# Patient Record
Sex: Female | Born: 1945 | Hispanic: No | Marital: Married | Smoking: Never smoker
Health system: Southern US, Community
[De-identification: ages and names within clinical notes are randomized; demographics above are authoritative.]

## PROBLEM LIST (undated history)

## (undated) DIAGNOSIS — N200 Calculus of kidney: Secondary | ICD-10-CM

## (undated) DIAGNOSIS — R911 Solitary pulmonary nodule: Secondary | ICD-10-CM

## (undated) DIAGNOSIS — T7840XA Allergy, unspecified, initial encounter: Secondary | ICD-10-CM

## (undated) DIAGNOSIS — C3490 Malignant neoplasm of unspecified part of unspecified bronchus or lung: Secondary | ICD-10-CM

## (undated) DIAGNOSIS — M810 Age-related osteoporosis without current pathological fracture: Secondary | ICD-10-CM

## (undated) DIAGNOSIS — Z8619 Personal history of other infectious and parasitic diseases: Secondary | ICD-10-CM

## (undated) DIAGNOSIS — D649 Anemia, unspecified: Secondary | ICD-10-CM

## (undated) DIAGNOSIS — M1990 Unspecified osteoarthritis, unspecified site: Secondary | ICD-10-CM

## (undated) DIAGNOSIS — I8392 Asymptomatic varicose veins of left lower extremity: Secondary | ICD-10-CM

## (undated) DIAGNOSIS — K649 Unspecified hemorrhoids: Secondary | ICD-10-CM

## (undated) DIAGNOSIS — K219 Gastro-esophageal reflux disease without esophagitis: Secondary | ICD-10-CM

## (undated) DIAGNOSIS — J309 Allergic rhinitis, unspecified: Secondary | ICD-10-CM

## (undated) DIAGNOSIS — R011 Cardiac murmur, unspecified: Secondary | ICD-10-CM

## (undated) HISTORY — DX: Gastro-esophageal reflux disease without esophagitis: K21.9

## (undated) HISTORY — DX: Anemia, unspecified: D64.9

## (undated) HISTORY — DX: Unspecified osteoarthritis, unspecified site: M19.90

## (undated) HISTORY — DX: Allergy, unspecified, initial encounter: T78.40XA

## (undated) HISTORY — DX: Cardiac murmur, unspecified: R01.1

## (undated) HISTORY — DX: Allergic rhinitis, unspecified: J30.9

## (undated) HISTORY — DX: Asymptomatic varicose veins of left lower extremity: I83.92

## (undated) HISTORY — PX: BREAST EXCISIONAL BIOPSY: SUR124

## (undated) HISTORY — DX: Calculus of kidney: N20.0

## (undated) HISTORY — DX: Age-related osteoporosis without current pathological fracture: M81.0

## (undated) HISTORY — PX: WRIST FRACTURE SURGERY: SHX121

## (undated) HISTORY — DX: Unspecified hemorrhoids: K64.9

---
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
## 1973-05-22 HISTORY — PX: LIPOMA EXCISION: SHX5283

## 1995-05-23 HISTORY — PX: ABDOMINAL HYSTERECTOMY: SHX81

## 2009-05-22 DIAGNOSIS — K649 Unspecified hemorrhoids: Secondary | ICD-10-CM

## 2009-05-22 HISTORY — DX: Unspecified hemorrhoids: K64.9

## 2009-12-08 ENCOUNTER — Ambulatory Visit: Payer: Self-pay | Admitting: Family

## 2009-12-08 ENCOUNTER — Ambulatory Visit: Payer: Self-pay | Admitting: Diagnostic Radiology

## 2009-12-08 ENCOUNTER — Ambulatory Visit (HOSPITAL_BASED_OUTPATIENT_CLINIC_OR_DEPARTMENT_OTHER): Admission: RE | Admit: 2009-12-08 | Discharge: 2009-12-08 | Payer: Self-pay | Admitting: Internal Medicine

## 2009-12-08 DIAGNOSIS — R609 Edema, unspecified: Secondary | ICD-10-CM | POA: Insufficient documentation

## 2009-12-08 DIAGNOSIS — K921 Melena: Secondary | ICD-10-CM | POA: Insufficient documentation

## 2009-12-08 DIAGNOSIS — K219 Gastro-esophageal reflux disease without esophagitis: Secondary | ICD-10-CM | POA: Insufficient documentation

## 2009-12-09 ENCOUNTER — Ambulatory Visit: Payer: Self-pay | Admitting: Diagnostic Radiology

## 2009-12-09 ENCOUNTER — Ambulatory Visit (HOSPITAL_BASED_OUTPATIENT_CLINIC_OR_DEPARTMENT_OTHER): Admission: RE | Admit: 2009-12-09 | Discharge: 2009-12-09 | Payer: Self-pay | Admitting: Internal Medicine

## 2009-12-09 ENCOUNTER — Telehealth: Payer: Self-pay | Admitting: Gastroenterology

## 2009-12-09 ENCOUNTER — Ambulatory Visit: Payer: Self-pay | Admitting: Internal Medicine

## 2009-12-09 ENCOUNTER — Encounter: Payer: Self-pay | Admitting: Family

## 2009-12-09 LAB — CONVERTED CEMR LAB
AST: 21 units/L (ref 0–37)
Basophils Absolute: 0.1 10*3/uL (ref 0.0–0.1)
Basophils Relative: 2 % — ABNORMAL HIGH (ref 0–1)
CO2: 23 meq/L (ref 19–32)
Calcium: 9.7 mg/dL (ref 8.4–10.5)
Chloride: 105 meq/L (ref 96–112)
Cholesterol: 206 mg/dL — ABNORMAL HIGH (ref 0–200)
Clue Cells Wet Prep HPF POC: NONE SEEN
Eosinophils Absolute: 0.2 10*3/uL (ref 0.0–0.7)
Eosinophils Relative: 4 % (ref 0–5)
Hemoglobin: 13.8 g/dL (ref 12.0–15.0)
Lymphocytes Relative: 44 % (ref 12–46)
Lymphs Abs: 2.6 10*3/uL (ref 0.7–4.0)
MCHC: 33.9 g/dL (ref 30.0–36.0)
Monocytes Absolute: 0.7 10*3/uL (ref 0.1–1.0)
Neutro Abs: 2.3 10*3/uL (ref 1.7–7.7)
RDW: 13.1 % (ref 11.5–15.5)
Sodium: 142 meq/L (ref 135–145)
TSH: 3.047 microintl units/mL (ref 0.350–4.500)
Total CHOL/HDL Ratio: 3.9
Trich, Wet Prep: NONE SEEN
Triglycerides: 97 mg/dL (ref <150)
VLDL: 19 mg/dL (ref 0–40)
Vit D, 1,25-Dihydroxy: 28 — ABNORMAL LOW (ref 30–89)
WBC: 5.9 10*3/uL (ref 4.0–10.5)

## 2009-12-09 LAB — HM MAMMOGRAPHY: HM Mammogram: NORMAL

## 2009-12-10 ENCOUNTER — Ambulatory Visit: Payer: Self-pay | Admitting: Gastroenterology

## 2009-12-10 ENCOUNTER — Encounter (INDEPENDENT_AMBULATORY_CARE_PROVIDER_SITE_OTHER): Payer: Self-pay | Admitting: *Deleted

## 2009-12-10 DIAGNOSIS — K625 Hemorrhage of anus and rectum: Secondary | ICD-10-CM | POA: Insufficient documentation

## 2009-12-13 ENCOUNTER — Encounter: Payer: Self-pay | Admitting: Family

## 2009-12-15 ENCOUNTER — Ambulatory Visit: Payer: Self-pay | Admitting: Gastroenterology

## 2009-12-24 ENCOUNTER — Telehealth: Payer: Self-pay | Admitting: Family

## 2009-12-24 DIAGNOSIS — M899 Disorder of bone, unspecified: Secondary | ICD-10-CM | POA: Insufficient documentation

## 2009-12-24 DIAGNOSIS — M949 Disorder of cartilage, unspecified: Secondary | ICD-10-CM

## 2009-12-28 ENCOUNTER — Encounter: Payer: Self-pay | Admitting: Family

## 2009-12-28 LAB — CONVERTED CEMR LAB
Calcium, Total (PTH): 10.1 mg/dL (ref 8.4–10.5)
Vit D, 1,25-Dihydroxy: 26 — ABNORMAL LOW (ref 30–89)

## 2009-12-31 ENCOUNTER — Telehealth: Payer: Self-pay | Admitting: Family

## 2010-01-05 ENCOUNTER — Encounter: Payer: Self-pay | Admitting: Gastroenterology

## 2010-01-19 ENCOUNTER — Telehealth: Payer: Self-pay | Admitting: Family

## 2010-01-20 HISTORY — PX: HEMORRHOID SURGERY: SHX153

## 2010-02-03 ENCOUNTER — Ambulatory Visit (HOSPITAL_COMMUNITY): Admission: RE | Admit: 2010-02-03 | Discharge: 2010-02-03 | Payer: Self-pay | Admitting: Surgery

## 2010-03-09 ENCOUNTER — Encounter (INDEPENDENT_AMBULATORY_CARE_PROVIDER_SITE_OTHER): Payer: Self-pay | Admitting: *Deleted

## 2010-06-21 NOTE — Consult Note (Signed)
Summary: [facility]   Imported By: Lennie Odor 01/28/2010 17:11:51  _____________________________________________________________________  External Attachment:    Type:   Image     Comment:   External Document

## 2010-06-21 NOTE — Assessment & Plan Note (Signed)
Summary: new to be est wants cpx/mhf--rm 5   Vital Signs:  Patient profile:   65 year old female Height:      69.5 inches Weight:      205 pounds BMI:     29.95 Temp:     98.4 degrees F oral Pulse rate:   90 / minute Pulse rhythm:   regular Resp:     18 per minute BP sitting:   118 / 80  (right arm) Cuff size:   large  Vitals Entered By: Mervin Kung CMA Duncan Dull) (December 08, 2009 1:56 PM) Is Patient Diabetic? No   History of Present Illness: Stephanie Crawford is a 65 year old female who presents to establish care. Has not had a PCP since moving here in 2005  1)BRBPR-  with some BM's over last 2 weeks, more so the last 2 weeks.  Last colonoscopy 2005 normal per patient.  + history of hemorrhoids.  These studies were done in [place].  2) Weight gain- 15 lb weight gain since March.  Has not changed eating habits or her activity.    3) Notes some vaginal itching/inner leg, using hydrocortisone.    4) Indigestion-  +nausea, unsettled.  Denies diarrhea or vomitting  5) R ankle swelling- remote injury.  Preventive Screening-Counseling & Management  Alcohol-Tobacco     Alcohol drinks/day: <1     Alcohol type: wine     Smoking Status: never  Caffeine-Diet-Exercise     Caffeine use/day: 3 drinks daily     Does Patient Exercise: yes     Type of exercise: walking     Exercise (avg: min/session): 30-60     Times/week: <3  Allergies (verified): No Known Drug Allergies  Past History:  Past Medical History: HIV testing--2009 Allergies Heart Murmur hemorrhoids anemia prior to hysterectomy  Past Surgical History: Appendectomy--1960 Hysterectomy--1997 (uterus only) Fatty tumor removed back of neck--1975 Fatty tumor removed left breast--1975 Fatty tumor removed under right jaw--1975  Family History: Father--prostate cancer, deceased Maternal GM--lung caner, deceased Mom- living alive and well 5 children  1974-Son-alive and well 1976- Daughter-alive and well 52- Son-  knee problems (basketball injury) 1982-Daughter- asthma 73- daughter- in military Pt has 7 siblings, - + vitamin D deficiency, asthma, allergies.  Social History: Occupation: Tax Museum/gallery conservator Married Never Smoked Alcohol use-yes,  1-2 glasses rarely Regular exercise-yes- walking Smoking Status:  never Does Patient Exercise:  yes Caffeine use/day:  3 drinks daily  Review of Systems       Constitutional: Denies Fever ENT:  Denies nasal congestion or sore throat. Resp: occasional dust related cough CV:  Denies Chest Pain or shortness of breath GI:  Denies nausea or vomitting or diarrhea GU: Denies dysuria Lymphatic: Denies lymphadenopathy Musculoskeletal:  Occasional pain in legs due to varicose veins Skin:  Denies Rashes or  concerning lesions Psychiatric: Denies depression Neuro: Denies numbness or weakness.     Physical Exam  General:  Well-developed,well-nourished,in no acute distress; alert,appropriate and cooperative throughout examination Head:  Normocephalic and atraumatic without obvious abnormalities. No apparent alopecia or balding. Eyes:  PERRLA Ears:  External ear exam shows no significant lesions or deformities.  Otoscopic examination reveals clear canals, tympanic membranes are intact bilaterally without bulging, retraction, inflammation or discharge. Hearing is grossly normal bilaterally. Mouth:  Oral mucosa and oropharynx without lesions or exudates.  Teeth in good repair. Neck:  No deformities, masses, or tenderness noted. Breasts:  No mass, nodules, thickening, tenderness, bulging, retraction, inflamation, nipple discharge or skin changes noted.  Lungs:  Normal respiratory effort, chest expands symmetrically. Lungs are clear to auscultation, no crackles or wheezes. Heart:  Normal rate and regular rhythm. S1 and S2 normal without gallop, murmur, click, rub or other extra sounds. Abdomen:  Bowel sounds positive,abdomen soft and non-tender without masses,  organomegaly or hernias noted. Rectal:  + friable approximately 1 cm rectal protrusion with irregular borders,  two small nodular abnormalities on posterior rectal wall.  Genitalia:  Pelvic Exam:        External: normal female genitalia without lesions or masses        Vagina: normal without lesions or masses        Cervix: surgically absent        Adnexa: normal bimanual exam without masses or fullness        Uterus: surgically absent        Pap smear: not performed Msk:  some swelling noted of the right lateral ankle. Pulses:  R and L carotid,radial,femoral,dorsalis pedis and posterior tibial pulses are full and equal bilaterally Extremities:  + spider veins bilateral LE Neurologic:  No cranial nerve deficits noted. Station and gait are normal. Plantar reflexes are down-going bilaterally. DTRs are symmetrical throughout. Sensory, motor and coordinative functions appear intact. Skin:  + redness bilateral groin Cervical Nodes:  No lymphadenopathy noted Axillary Nodes:  No palpable lymphadenopathy Psych:  Cognition and judgment appear intact. Alert and cooperative with normal attention span and concentration. No apparent delusions, illusions, hallucinations   Impression & Recommendations:  Problem # 1:  Preventive Health Care (ICD-V70.0) Assessment Comment Only Patient was counseled on healthy diet, exercise and weight loss.  Orders: Mammogram (Screening) (Mammo) TLB-BMP (Basic Metabolic Panel-BMET) (80048-METABOL) TLB-CBC Platelet - w/Differential (85025-CBCD) TLB-Hepatic/Liver Function Pnl (80076-HEPATIC) TLB-TSH (Thyroid Stimulating Hormone) (84443-TSH) TLB-Lipid Panel (80061-LIPID) T-Assay of Vitamin D (95621-30865) T-Wet Prep by Molecular Probe (78469-62952) EKG w/ Interpretation (93000)  Problem # 2:  HEMATOCHEZIA (ICD-578.1) Assessment: New  Will plan referral to GI.  Also needs evaluation of rectal abnormalities.  ? polyps, ? irregular hemorrhoid versus rectal mass.     Orders: Gastroenterology Referral (GI)  Problem # 3:  ANKLE EDEMA (ICD-782.3) Will order x-ray to further evaluate.  Suspect OA. Orders: T-Ankle Comp Right (73610TC)  Problem # 4:  GERD (ICD-530.81) Will plan to check LFT's given pt's GI complaints.  However I suspect that her symptoms are GERD related.  Will give trial of GERD Her updated medication list for this problem includes:    Prilosec Otc 20 Mg Tbec (Omeprazole magnesium) ..... One tablet by mouth daily  Complete Medication List: 1)  Prilosec Otc 20 Mg Tbec (Omeprazole magnesium) .... One tablet by mouth daily 2)  Caltrate 600+d 600-400 Mg-unit Tabs (Calcium carbonate-vitamin d) .... One tablet by mouth two times a day 3)  Nystatin 100000 Unit/gm Crea (Nystatin) .... Apply two times a day to affected area  Patient Instructions: 1)  Please return fasting for blood work downstairs. 2)  Please schedule a bone density test at check out. 3)  You will be contacted aobut your referral to GI.   4)  Please complete your knee x-ray and mammogram downstairs. 5)  Follow up in 3 months. 6)  It was a pleasure to meet you. Prescriptions: NYSTATIN 100000 UNIT/GM CREA (NYSTATIN) apply two times a day to affected area  #1 x 0   Entered and Authorized by:   Stephanie Fillers FNP   Signed by:   Stephanie Fillers FNP on 12/08/2009   Method used:  Electronically to        [facility] # [phone]* (retail)       [address]       [place], [place]  [address]       Ph: [phone]       Fax: [phone]   RxID:   [id number]    Vital Signs:  Patient Profile:   65 year old female Height:     69.5 inches Weight:      205 pounds BMI:     29.95 Temp:     98.4 degrees F oral Pulse rate:   90 / minute Pulse rhythm:   regular Resp:     18 per minute BP sitting:   118 / 80 Cuff size:   large                   Preventive Care Screening     Pt had pap smear and mammogram in 2008 (not sure  which month)--normal.  Last Bone Density in 2009--osteopenia   Current Allergies (reviewed today): No known allergies

## 2010-06-21 NOTE — Progress Notes (Signed)
Summary: bone density result  Phone Note Outgoing Call   Summary of Call: Please call patient and let her know that her bone density test is showing some bone thinning- osteopenia.  I would like to check some additional lab tests.  Please arrange the following labs- Vitamin D, intact PTH (733.9).   Initial call taken by: Lemont Fillers FNP,  December 24, 2009 4:41 PM  Follow-up for Phone Call        Left message on machine to return my call.  Nicki Guadalajara Fergerson CMA Duncan Dull)  December 27, 2009 8:24 AM   Pt returned my call and was notified per [facility] instructions.  Pt will return tomorrow for labs, fasting. Order sent to lab.  Nicki Guadalajara Fergerson CMA Duncan Dull)  December 27, 2009 9:38 AM   New Problems: OSTEOPENIA (ICD-733.90)   New Problems: OSTEOPENIA (ICD-733.90)

## 2010-06-21 NOTE — Procedures (Signed)
Summary: Colonoscopy  Patient: [name] Note: All result statuses are Final unless otherwise noted.  Tests: (1) Colonoscopy (COL)   COL Colonoscopy           DONE     [facility]     [address]     [place], [place]  [address]           COLONOSCOPY PROCEDURE REPORT           PATIENT:  Stephanie Crawford, Stephanie Crawford  MR#:  [id number]     BIRTHDATE:  07-26-1945, 64 yrs. old  GENDER:  female     ENDOSCOPIST:  Vania Rea. Jarold Motto, MD, [facility]     REF. BY:     PROCEDURE DATE:  12/15/2009     PROCEDURE:  Diagnostic Colonoscopy     ASA CLASS:  Class II     INDICATIONS:  hematochezia     MEDICATIONS:   Fentanyl 75 mcg IV, Versed 7 mg IV           DESCRIPTION OF PROCEDURE:   After the risks benefits and     alternatives of the procedure were thoroughly explained, informed     consent was obtained.  Digital rectal exam was performed and     revealed tender and large external hemorrhoids.  see pictures The     LB [id number] endoscope was introduced through the anus and     advanced to the cecum, which was identified by both the appendix     and ileocecal valve, limited by a redundant colon.    The quality     of the prep was excellent, using MoviPrep.  The instrument was     then slowly withdrawn as the colon was fully examined.     <<PROCEDUREIMAGES>>           FINDINGS:  Moderate diverticulosis was found throughout the colon.     pancolonic diverticulosis.  No polyps or cancers were seen.     External hemorrhoids were found.   Retroflexed views in the rectum     revealed external hemorrhoids.    The scope was then withdrawn     from the patient and the procedure completed.           COMPLICATIONS:  None     ENDOSCOPIC IMPRESSION:     1) Moderate diverticulosis throughout the colon     2) No polyps or cancers     3) External hemorrhoids     RECOMMENDATIONS:     surgical referral for hemorrhoidectomy.     REPEAT EXAM:  No           ______________________________   Vania Rea. Jarold Motto, MD, [facility]           CC:  Sandford Craze FNPWilson, Eric MD           n.     Rosalie DoctorVania Rea. Patterson at 12/15/2009 09:03 AM           Loretta Plume, [phone]  Note: An exclamation mark (!) indicates a result that was not dispersed into the flowsheet. Document Creation Date: 12/15/2009 9:05 AM _______________________________________________________________________  (1) Order result status: Final Collection or observation date-time: 12/15/2009 08:56 Requested date-time:  Receipt date-time:  Reported date-time:  Referring Physician:   Ordering Physician: Sheryn Bison [phone]) Specimen Source:  Source: Launa Grill Order Number: [id number] Lab site:   Appended Document: Colonoscopy     Procedures Next Due Date:    Colonoscopy: 11/2019

## 2010-06-21 NOTE — Progress Notes (Signed)
  Phone Note Outgoing Call   Summary of Call: Please call patient and let her know that her vitamin D is low and her bone scan is showing bone thinning.   I would like her to add the following medications as noted below.  She should follow up in 3 months. Initial call taken by: Lemont Fillers FNP,  January 03, 2010 1:52 PM  Follow-up for Phone Call        informed pt of results and medications to be taken Follow-up by: Brenton Grills MA,  January 03, 2010 2:05 PM    New/Updated Medications: CALTRATE 600+D 600-400 MG-UNIT TABS (CALCIUM CARBONATE-VITAMIN D) one tablet by mouth two times a day VITAMIN D 1000 UNIT TABS (CHOLECALCIFEROL) 2 tablet by mouth daily ALENDRONATE SODIUM 70 MG TABS (ALENDRONATE SODIUM) one tablet by mouth once weekly on empty stomach.  Avoid laying down for 30 minutes after taking. Prescriptions: ALENDRONATE SODIUM 70 MG TABS (ALENDRONATE SODIUM) one tablet by mouth once weekly on empty stomach.  Avoid laying down for 30 minutes after taking.  #4 x 3   Entered and Authorized by:   Lemont Fillers FNP   Signed by:   Lemont Fillers FNP on 01/03/2010   Method used:   Electronically to        [facility] # [phone]* (retail)       [address]       [place], [place]  [address]       Ph: [phone]       Fax: [phone]   RxID:   [id number]

## 2010-06-21 NOTE — Miscellaneous (Signed)
Summary: BONE DENSITY  Clinical Lists Changes  Orders: Added new Test order of T-Bone Densitometry (77080) - Signed Added new Test order of T-Lumbar Vertebral Assessment (77082) - Signed 

## 2010-06-21 NOTE — Letter (Signed)
Summary: [place] No Show Letter  [place] [facility]  [address]   [place], [place] [address]   Phone: [phone]  Fax: [phone]    03/09/2010 MRN: [id number]  Stephanie Crawford [address] [place], [place]  [address]   Dear [name],   Our records indicate that you missed your scheduled appointment with Sandford Craze on 03-09-2010.  Please contact this office to reschedule your appointment as soon as possible.  It is important that you keep your scheduled appointments with your physician, so we can provide you the best care possible.  Please be advised that there may be a charge for "no show" appointments.    Sincerely,   [place] [facility]

## 2010-06-21 NOTE — Letter (Signed)
Summary: [facility] Instructions  [facility]  [address] [place], [place] [address]   Phone: [phone]  Fax: [phone]       Stephanie Crawford    [birth year]    MRN: [id number]        Procedure Day Dorna Bloom: Wednesday, 12/15/09     Arrival Time: 7:30      Procedure Time: 8:30     Location of Procedure:                    _X _  [facility] (4th Floor)                         PREPARATION FOR COLONOSCOPY WITH MOVIPREP   Starting 5 days prior to your procedure 12/10/09 do not eat nuts, seeds, popcorn, corn, beans, peas,  salads, or any raw vegetables.  Do not take any fiber supplements (e.g. Metamucil, Citrucel, and Benefiber).  THE DAY BEFORE YOUR PROCEDURE         DATE: 12/14/09     DAY: Tuesday  1.  Drink clear liquids the entire day-NO SOLID FOOD  2.  Do not drink anything colored red or purple.  Avoid juices with pulp.  No orange juice.  3.  Drink at least 64 oz. (8 glasses) of fluid/clear liquids during the day to prevent dehydration and help the prep work efficiently.  CLEAR LIQUIDS INCLUDE: Water Jello Ice Popsicles Tea (sugar ok, no milk/cream) Powdered fruit flavored drinks Coffee (sugar ok, no milk/cream) Gatorade Juice: apple, white grape, white cranberry  Lemonade Clear bullion, consomm, broth Carbonated beverages (any kind) Strained chicken noodle soup Hard Candy                             4.  In the morning, mix first dose of MoviPrep solution:    Empty 1 Pouch A and 1 Pouch B into the disposable container    Add lukewarm drinking water to the top line of the container. Mix to dissolve    Refrigerate (mixed solution should be used within 24 hrs)  5.  Begin drinking the prep at 5:00 p.m. The MoviPrep container is divided by 4 marks.   Every 15 minutes drink the solution down to the next mark (approximately 8 oz) until the full liter is complete.   6.  Follow completed prep with 16 oz of clear liquid of your choice  (Nothing red or purple).  Continue to drink clear liquids until bedtime.  7.  Before going to bed, mix second dose of MoviPrep solution:    Empty 1 Pouch A and 1 Pouch B into the disposable container    Add lukewarm drinking water to the top line of the container. Mix to dissolve    Refrigerate  THE DAY OF YOUR PROCEDURE      DATE: 12/15/09    DAY: Wednesday  Beginning at  3:30 a.m. (5 hours before procedure):         1. Every 15 minutes, drink the solution down to the next mark (approx 8 oz) until the full liter is complete.  2. Follow completed prep with 16 oz. of clear liquid of your choice.    3. You may drink clear liquids until 6:30  (2 HOURS BEFORE PROCEDURE).   MEDICATION INSTRUCTIONS  Unless otherwise instructed, you should take regular prescription medications with a small sip of water  as early as possible the morning of your procedure.                  OTHER INSTRUCTIONS  You will need a responsible adult at least 64 years of age to accompany you and drive you home.   This person must remain in the waiting room during your procedure.  Wear loose fitting clothing that is easily removed.  Leave jewelry and other valuables at home.  However, you may wish to bring a book to read or  an iPod/MP3 player to listen to music as you wait for your procedure to start.  Remove all body piercing jewelry and leave at home.  Total time from sign-in until discharge is approximately 2-3 hours.  You should go home directly after your procedure and rest.  You can resume normal activities the  day after your procedure.  The day of your procedure you should not:   Drive   Make legal decisions   Operate machinery   Drink alcohol   Return to work  You will receive specific instructions about eating, activities and medications before you leave.    The above instructions have been reviewed and explained to me by   _______________________    I fully  understand and can verbalize these instructions _____________________________ Date _________

## 2010-06-21 NOTE — Progress Notes (Signed)
Summary: Faxed request from [facility]  Phone Note From Other Clinic   Reason for Call: Need Referral Information Summary of Call: Sent 12/08/09 OV notes & EKG report to [facility] Antonietta Breach RN [phone] per faxed request Initial call taken by: Lannette Donath,  January 19, 2010 3:11 PM

## 2010-06-21 NOTE — Assessment & Plan Note (Signed)
Summary: RECTAL BLEEDING/PL   History of Present Illness Visit Type: consult Primary GI MD: Sheryn Bison MD FACP FAGA Requesting Provider: Sandford Craze, NP Chief Complaint: BRB per rectum for 2 weeks, pt can see blood in toilet History of Present Illness:   65 year old female with recurrent rectal bleeding on and off periodically for at least 5-6 years. She apparently has had previous colonoscopies she been unremarkable, apparently last performed 5 years ago. We do not have these records for review.  She currently describes" hemorrhoids" in her rectum with bright red blood and difficulty wiping her anal area. She denies anorectal pain but does have frequent bleeding. She has no abdominal pain, denies constipation, and also denies upper gastrointestinal or hepatobiliary complaints and uses p.r.n. Prilosec for GERD. She is status post hysterectomy for fibroid tumors, and apparently in the past and been treated for iron deficiency anemia. Her mother had colon polyps at age 24. She does not smoke, abuse alcohol or NSAIDs.   GI Review of Systems    Reports weight gain.      Denies abdominal pain, acid reflux, belching, bloating, chest pain, dysphagia with liquids, dysphagia with solids, heartburn, loss of appetite, nausea, vomiting, vomiting blood, and  weight loss.      Reports hemorrhoids and  rectal bleeding.     Denies anal fissure, black tarry stools, change in bowel habit, constipation, diarrhea, diverticulosis, fecal incontinence, heme positive stool, irritable bowel syndrome, jaundice, light color stool, liver problems, and  rectal pain. Preventive Screening-Counseling & Management      Drug Use:  no.      Current Medications (verified): 1)  Prilosec Otc 20 Mg Tbec (Omeprazole Magnesium) .... One Tablet By Mouth Daily 2)  Caltrate 600+d 600-400 Mg-Unit Tabs (Calcium Carbonate-Vitamin D) .... One Tablet By Mouth Two Times A Day 3)  Nystatin 100000 Unit/gm Crea (Nystatin) ....  Apply Two Times A Day To Affected Area  Allergies (verified): No Known Drug Allergies  Past History:  Past medical, surgical, family and social histories (including risk factors) reviewed for relevance to current acute and chronic problems.  Past Medical History: HIV testing--2009 Allergies Heart Murmur hemorrhoids anemia prior to hysterectomy GERD  Past Surgical History: Reviewed history from 12/08/2009 and no changes required. Appendectomy--1960 Hysterectomy--1997 (uterus only) Fatty tumor removed back of neck--1975 Fatty tumor removed left breast--1975 Fatty tumor removed under right jaw--1975  Family History: Reviewed history from 12/08/2009 and no changes required. Father--prostate cancer, deceased Maternal GM--lung caner, deceased Mom- living alive and well 5 children  1974-Son-alive and well 65- Daughter-alive and well 39- Son- knee problems (basketball injury) 1982-Daughter- asthma 56- daughter- in military Pt has 7 siblings, - + vitamin D deficiency, asthma, allergies. No FH of Colon Cancer:  Social History: Reviewed history from 12/08/2009 and no changes required. Occupation: Tax Museum/gallery conservator, retired Married, 2 boys, 3 girls Never Smoked Alcohol use-yes,  1-2 glasses rarely Regular exercise-yes- walking Daily Caffeine Use 3 cups/day Illicit Drug Use - no Drug Use:  no  Review of Systems       The patient complains of muscle pains/cramps.  The patient denies allergy/sinus, anemia, anxiety-new, arthritis/joint pain, back pain, blood in urine, breast changes/lumps, confusion, cough, coughing up blood, depression-new, fainting, fatigue, fever, headaches-new, hearing problems, heart murmur, heart rhythm changes, itching, menstrual pain, night sweats, nosebleeds, pregnancy symptoms, shortness of breath, skin rash, sleeping problems, sore throat, swelling of feet/legs, swollen lymph glands, thirst - excessive, urination - excessive, urination changes/pain,  urine leakage, vision changes, and voice  change.   General:  Denies fever, chills, sweats, anorexia, fatigue, weakness, malaise, weight loss, and sleep disorder. GI:  Denies difficulty swallowing, pain on swallowing, nausea, indigestion/heartburn, vomiting, vomiting blood, abdominal pain, jaundice, gas/bloating, diarrhea, constipation, change in bowel habits, bloody BM's, black BMs, and fecal incontinence.  Vital Signs:  Patient profile:   65 year old female Height:      69.5 inches Weight:      205 pounds BMI:     29.95 Pulse rate:   76 / minute Pulse rhythm:   regular BP sitting:   118 / 80  (left arm) Cuff size:   regular  Vitals Entered By: Francee Piccolo CMA Duncan Dull) (December 10, 2009 11:02 AM)  Physical Exam  General:  Well developed, well nourished, no acute distress.healthy appearing.   Head:  Normocephalic and atraumatic. Eyes:  PERRLA, no icterus.exam deferred to patient's ophthalmologist.   Neck:  Supple; no masses or thyromegaly.Slightly swollen and tender right submandibular gland noted. Her thyroid also is palpable but nontender. Lungs:  Clear throughout to auscultation. Heart:  Regular rate and rhythm; no murmurs, rubs,  or bruits. Abdomen:  Soft, nontender and nondistended. No masses, hepatosplenomegaly or hernias noted. Normal bowel sounds. Rectal:  Anterior chronic seizure noted with prominent skin tag and anorectal verge with marked friability and some nodularity. This mass is nontender to touch but does bleed easily. Rectal exam otherwise was unremarkable. Extremities:  No clubbing, cyanosis, edema or deformities noted. Neurologic:  Alert and  oriented x4;  grossly normal neurologically. Cervical Nodes:  No significant cervical adenopathy. Psych:  Alert and cooperative. Normal mood and affect.   Impression & Recommendations:  Problem # 1:  RECTAL BLEEDING (ICD-569.3) Assessment Unchanged Anal polyp versus prominent skin tag from chronic fissuring. I am  concerned about this lesion size, appearance, and continue daily rectal bleeding. This lesion will need to be removed surgically, and surgical appointment has been made and also she has been scheduled for followup colonoscopy exam.Balneol solution with cotton ball wipes t.i.d. as tolerated along with high-fiber diet as tolerated.  Problem # 2:  GERD (ICD-530.81) Assessment: Improved She is on Prilosec per her primary care physician. She denies significant GERD symptomatology. Recent labs have been unremarkable including CBC.  Patient Instructions: 1)  Use Balneol solution to cleanse rectal area. 2)  You are scheduled for a colonoscopy. 3)  You will be referred to a surgoen. 4)  The medication list was reviewed and reconciled.  All changed / newly prescribed medications were explained.  A complete medication list was provided to the patient / caregiver. 5)  Copy sent to : Melissa O. Lendell Caprice nurse practitioner 6)  Please continue current medications.  7)  Constipation and Hemorrhoids brochure given.  8)  Colonoscopy and Flexible Sigmoidoscopy brochure given.  9)  Conscious Sedation brochure given.  10)  Local anal care as instructed 11)  Diet should be high in fiber ( fruits, vegetables, whole grains) but low in residue. Drink at least eight (8) glasses of water a day.   Appended Document: RECTAL BLEEDING/PL    Clinical Lists Changes  Medications: Added new medication of MOVIPREP 100 GM  SOLR (PEG-KCL-NACL-NASULF-NA ASC-C) As per prep instructions. - Signed Rx of MOVIPREP 100 GM  SOLR (PEG-KCL-NACL-NASULF-NA ASC-C) As per prep instructions.;  #1 x 0;  Signed;  Entered by: Ashok Cordia RN;  Authorized by: Mardella Layman MD [facility];  Method used: Electronically to [facility] # [id number]*, [address], [employer]  [address] [place], [place]  [address], Ph: [phone], Fax: [phone] Orders: Added new Test order of Colonoscopy (Colon) - Signed    Prescriptions: MOVIPREP 100 GM  SOLR  (PEG-KCL-NACL-NASULF-NA ASC-C) As per prep instructions.  #1 x 0   Entered by:   Ashok Cordia RN   Authorized by:   Mardella Layman MD [facility]   Signed by:   Ashok Cordia RN on 12/10/2009   Method used:   Electronically to        [facility] [address] # [phone]* (retail)       [address]       [place], [place]  [address]       Ph: [phone]       Fax: [phone]   RxID:   [id number]    Appended Document: RECTAL BLEEDING/PL    Clinical Lists Changes       Appended Document: RECTAL BLEEDING/PL Elane Fritz from [facility] calling.  Appt with Dr. Luisa Hart 12/30/09 arrive at 9:00.  Elane Fritz will call pt.   Clinical Lists Changes  Orders: Added new Test order of [facility] ([facility]) - Signed

## 2010-06-21 NOTE — Progress Notes (Signed)
Summary: Appt sooner than 9-7  Phone Note From Other Clinic   Caller: [facility] @ Sandford Craze, FNP Call For: Dr Christella Hartigan Reason for Call: Schedule Patient Appt Summary of Call: Would like patient seen sooner than 01-26-10 for rectal bleed. Initial call taken by: Leanor Kail [facility],  December 09, 2009 2:29 PM  Follow-up for Phone Call        pt sch as a new pt with Dr Leane Para aware will notify pt Follow-up by: Chales Abrahams CMA Duncan Dull),  December 09, 2009 3:13 PM

## 2010-06-21 NOTE — Letter (Signed)
   [place] [facility] [address] [place], [place]  [address]  [place] Phone: [phone]      December 13, 2009   Stephanie Crawford [address] [place], [place] [address]  RE:  LAB RESULTS  Dear  [name],  The following is an interpretation of your most recent lab tests.  Please take note of any instructions provided or changes to medications that have resulted from your lab work.  ELECTROLYTES:  Good - no changes needed  KIDNEY FUNCTION TESTS:  Good - no changes needed  LIVER FUNCTION TESTS:  Stable - no changes needed  LIPID PANEL:  Stable - no changes needed Triglyceride: 97   Cholesterol: 206   LDL: 134   HDL: 53   Chol/HDL%:  3.9 Ratio  THYROID STUDIES:  Thyroid studies normal TSH: 3.047     DIABETIC STUDIES:  Good - no changes needed Blood Glucose: 91    CBC:  Good - no changes needed The vaginal swab that we completed does not show strong sign of infection.  Please call if your symptoms have not improved.   Sincerely Yours,    Lemont Fillers FNP  Appended Document:  Mailed.

## 2010-08-04 LAB — SURGICAL PCR SCREEN
MRSA, PCR: NEGATIVE
Staphylococcus aureus: NEGATIVE

## 2010-08-04 LAB — DIFFERENTIAL
Basophils Relative: 1 % (ref 0–1)
Eosinophils Relative: 3 % (ref 0–5)
Lymphocytes Relative: 40 % (ref 12–46)
Monocytes Absolute: 0.5 10*3/uL (ref 0.1–1.0)
Monocytes Relative: 9 % (ref 3–12)
Neutrophils Relative %: 46 % (ref 43–77)

## 2010-08-04 LAB — COMPREHENSIVE METABOLIC PANEL
AST: 43 U/L — ABNORMAL HIGH (ref 0–37)
BUN: 8 mg/dL (ref 6–23)
Calcium: 10 mg/dL (ref 8.4–10.5)
Chloride: 104 mEq/L (ref 96–112)
Sodium: 142 mEq/L (ref 135–145)
Total Bilirubin: 1.6 mg/dL — ABNORMAL HIGH (ref 0.3–1.2)

## 2010-08-04 LAB — CBC
HCT: 40.6 % (ref 36.0–46.0)
Hemoglobin: 14.2 g/dL (ref 12.0–15.0)
RBC: 4.48 MIL/uL (ref 3.87–5.11)
WBC: 5.8 10*3/uL (ref 4.0–10.5)

## 2010-12-06 ENCOUNTER — Ambulatory Visit (INDEPENDENT_AMBULATORY_CARE_PROVIDER_SITE_OTHER): Payer: BC Managed Care – PPO | Admitting: Surgery

## 2010-12-06 ENCOUNTER — Encounter (INDEPENDENT_AMBULATORY_CARE_PROVIDER_SITE_OTHER): Payer: Self-pay | Admitting: Surgery

## 2010-12-06 DIAGNOSIS — K625 Hemorrhage of anus and rectum: Secondary | ICD-10-CM

## 2010-12-06 NOTE — Patient Instructions (Signed)
You will be scheduled for surgery.

## 2010-12-06 NOTE — Progress Notes (Signed)
Stephanie Crawford is a 65 y.o. female.    Chief Complaint  Patient presents with  . Other    3 month recheck hems    HPI HPI The patient returns to clinic today. She is almost one year out from hemorrhoidectomy. She had a 3 column hemorrhoidectomy back in September 2011. I last saw her in March of 2012 she had a small area of chronic granulation tissue noted. She also had a small anal fissure. She did have bright red blood per rectum. The amount is small. She denies any significant pain in the canal   No past medical history on file.  No past surgical history on file.  No family history on file.  Social History History  Substance Use Topics  . Smoking status: Not on file  . Smokeless tobacco: Not on file  . Alcohol Use: Not on file    No Known Allergies  No current outpatient prescriptions on file.    Review of Systems Review of Systems  Constitutional: Negative.   HENT: Negative.   Eyes: Negative.   Respiratory: Negative.   Cardiovascular: Negative.   Gastrointestinal: Negative.   Genitourinary: Negative.   Skin: Negative.    Positive for rectal bleeding  Physical Exam Physical Exam  Constitutional: She is oriented to person, place, and time. She appears well-developed and well-nourished.  HENT:  Head: Normocephalic and atraumatic.  Eyes: Conjunctivae and EOM are normal. Pupils are equal, round, and reactive to light.  Neck: Normal range of motion. Neck supple.  GI: Soft. Bowel sounds are normal.  Genitourinary: Guaiac negative stool.       Anal canal with small inflamed skin tag.  No fissure/ Tone normal.  No masses.  Neurological: She is alert and oriented to person, place, and time.  Skin: Skin is warm and dry.     There were no vitals taken for this visit.  Assessment/Plan Assessment: Persistent rectal bleeding  Plan: After exam today, had a lengthy discussion with the patient about options. She has a small inflamed skin tag and anal canal that  could explain her bleeding. I have recommended exam under anesthesia to further evaluate her rectal bleeding. If this is normal, she will will require a referral to a gastroenterologist. Risk of bleeding, infection, incontinence, and the need for other procedures were discussed with the patient. Recovery will depend on what is found. I discussed out of work time with her today. This will depend on what is found she understands the above wishes to proceed.  Kyair Ditommaso A. 12/06/2010, 10:04 AM

## 2011-01-11 ENCOUNTER — Encounter (HOSPITAL_BASED_OUTPATIENT_CLINIC_OR_DEPARTMENT_OTHER)
Admission: RE | Admit: 2011-01-11 | Discharge: 2011-01-11 | Disposition: A | Discharge status: Home / Self Care | Payer: BC Managed Care – PPO | Source: Ambulatory Visit | Attending: Surgery | Admitting: Surgery

## 2011-01-11 LAB — DIFFERENTIAL
Basophils Relative: 2 % — ABNORMAL HIGH (ref 0–1)
Eosinophils Relative: 2 % (ref 0–5)
Lymphs Abs: 2.9 10*3/uL (ref 0.7–4.0)
Monocytes Absolute: 0.5 10*3/uL (ref 0.1–1.0)
Neutro Abs: 2.4 10*3/uL (ref 1.7–7.7)
Neutrophils Relative %: 40 % — ABNORMAL LOW (ref 43–77)

## 2011-01-11 LAB — CBC
Hemoglobin: 13.8 g/dL (ref 12.0–15.0)
MCH: 30.7 pg (ref 26.0–34.0)
MCHC: 34.9 g/dL (ref 30.0–36.0)
MCV: 87.8 fL (ref 78.0–100.0)
RBC: 4.5 MIL/uL (ref 3.87–5.11)
RDW: 12.2 % (ref 11.5–15.5)
WBC: 6.1 10*3/uL (ref 4.0–10.5)

## 2011-01-11 LAB — BASIC METABOLIC PANEL
Calcium: 9.9 mg/dL (ref 8.4–10.5)
Creatinine, Ser: 0.77 mg/dL (ref 0.50–1.10)
GFR calc non Af Amer: >60 mL/min (ref >60)
Potassium: 3.7 mEq/L (ref 3.5–5.1)

## 2011-01-16 ENCOUNTER — Ambulatory Visit (HOSPITAL_BASED_OUTPATIENT_CLINIC_OR_DEPARTMENT_OTHER)
Admission: RE | Admit: 2011-01-16 | Discharge: 2011-01-16 | Disposition: A | Discharge status: Home / Self Care | Payer: BC Managed Care – PPO | Source: Ambulatory Visit | Attending: Surgery | Admitting: Surgery

## 2011-01-16 DIAGNOSIS — K922 Gastrointestinal hemorrhage, unspecified: Secondary | ICD-10-CM

## 2011-01-16 DIAGNOSIS — K921 Melena: Secondary | ICD-10-CM

## 2011-01-16 DIAGNOSIS — K6289 Other specified diseases of anus and rectum: Secondary | ICD-10-CM | POA: Insufficient documentation

## 2011-01-16 DIAGNOSIS — Z01812 Encounter for preprocedural laboratory examination: Secondary | ICD-10-CM | POA: Insufficient documentation

## 2011-01-16 DIAGNOSIS — Z01810 Encounter for preprocedural cardiovascular examination: Secondary | ICD-10-CM | POA: Insufficient documentation

## 2011-01-16 LAB — POCT HEMOGLOBIN-HEMACUE: Hemoglobin: 13.9 g/dL (ref 12.0–15.0)

## 2011-01-23 NOTE — Op Note (Signed)
Stephanie Crawford, Stephanie Crawford          ACCOUNT NO.:  [id number]  MEDICAL RECORD NO.:  [id number]  LOCATION:                                 FACILITY:  PHYSICIAN:  Maisie Fus A. Luanna Weesner, M.D.DATE OF BIRTH:  [birth year]  DATE OF PROCEDURE:  01/16/2011 DATE OF DISCHARGE:                              OPERATIVE REPORT   PREOPERATIVE DIAGNOSIS:  Rectal bleeding.  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  PROCEDURE:  Exam under anesthesia with fulguration of granulation tissue of distal rectum and anal canal.  SURGEON:  Clovis Pu. Keamber Macfadden, MD.  ANESTHESIA:  LMA with 0.25% Sensorcaine local with epinephrine.  ESTIMATED BLOOD LOSS:  Minimal.  SPECIMENS:  None.  INDICATIONS FOR PROCEDURE:  The patient is a 65 year old female who underwent a hemorrhoidectomy about a year ago.  She also had a postoperative anal fissure.  These all resolved, but she continued to have intermittent rectal bleeding.  This was concerning to her. Examination shows some inflammation of the distal anal canal.  There is some question that there is some buildup of granulation tissue as well and on exam in the office.  I felt that exam under anesthesia, I was warranted to further evaluate this and potentially treat chronic granulation tissue which is leading the rectal bleeding.  I explained all this to her.  I explained the risks of procedure to include bleeding, infection, also the fact that nothing to be done and this could potentially resolve on its own, this has been going on now for at least 6 months and was concerning to her.  I did not feel it was going to heal on its own without further evaluation and treatment.  She understood the above and the alternatives of surgery and agreed to proceed.  DESCRIPTION OF PROCEDURE:  The patient was seen in the holding area. Questions were answered.  She was brought back to the operating room. She was placed supine.  LMA  anesthesia was initiated and then she was placed in lithotomy.  Time-out was done.  This was done after sterile prep and drape of the anal canal.  Digital examination revealed was normal.  There was an area of hyperplastic tissue in the right lateral anal canal.  Anoscope was placed.  She had significant granulation tissue of the anal canal from her previous hemorrhoidectomy in the right posterolateral anal canal into the distal rectum as well as in the posterior midline.  There is no mass or other suspicious lesion.  There is no residual hemorrhoidal tissue and no evidence of anal fissure. There was mild narrowing of the anal canal.  I used cautery to fulgurate the excessive granulation tissue and hyperplastic tissue to the anal canal.  The remainder of her exam under anesthesia was normal.  Her tone was normal.  Hemostasis was achieved.  Gelfoam wrapped with Surgicel was placed in the anal canal, was packing.  We did an anal block with 0.25% Sensorcaine with epinephrine.  All final counts of sponge, needle, and instruments found be correct.  She was awoke, extubated, taken to recovery in satisfactory condition.     Arthi Mcdonald A. Ary Rudnick, M.D.     TAC/MEDQ  D:  01/16/2011  T:  01/16/2011  Job:  [id number]  Electronically Signed by Harriette Bouillon M.D. on 01/23/2011 11:00:04 AM

## 2011-02-10 ENCOUNTER — Encounter (INDEPENDENT_AMBULATORY_CARE_PROVIDER_SITE_OTHER): Payer: Self-pay | Admitting: Surgery

## 2011-02-10 ENCOUNTER — Ambulatory Visit (INDEPENDENT_AMBULATORY_CARE_PROVIDER_SITE_OTHER): Payer: BC Managed Care – PPO | Admitting: Surgery

## 2011-02-10 VITALS — BP 116/74 | HR 68 | Temp 96.8°F | Resp 14 | Ht 70.0 in | Wt 202.8 lb

## 2011-02-10 DIAGNOSIS — Z9889 Other specified postprocedural states: Secondary | ICD-10-CM

## 2011-02-10 NOTE — Progress Notes (Signed)
The patient returns today after undergoing an exam under anesthesia. This was due to rectal bleeding. She is doing well. There were some chronic granulation tissue that I cauterized. The remainder of her examination was normal.  On exam: Anal canal healing well. No signs of infection. No persistent bleeding.  Impression: Exam under anesthesia  Plan: Continue local wound care. Return to clinic if any further bleeding.

## 2011-02-10 NOTE — Patient Instructions (Addendum)
Return if you have any problems.  If any bleeding after  6 weeks,  Return for recheck.  Return to work on sept 6.  Full duty.

## 2011-03-01 ENCOUNTER — Other Ambulatory Visit: Payer: Self-pay | Admitting: Family

## 2011-03-01 ENCOUNTER — Encounter: Payer: Self-pay | Admitting: Family

## 2011-03-01 ENCOUNTER — Ambulatory Visit (INDEPENDENT_AMBULATORY_CARE_PROVIDER_SITE_OTHER): Payer: BC Managed Care – PPO | Admitting: Family

## 2011-03-01 DIAGNOSIS — Z0000 Encounter for general adult medical examination without abnormal findings: Secondary | ICD-10-CM | POA: Insufficient documentation

## 2011-03-01 DIAGNOSIS — R9431 Abnormal electrocardiogram [ECG] [EKG]: Secondary | ICD-10-CM | POA: Insufficient documentation

## 2011-03-01 DIAGNOSIS — K219 Gastro-esophageal reflux disease without esophagitis: Secondary | ICD-10-CM

## 2011-03-01 DIAGNOSIS — Z1231 Encounter for screening mammogram for malignant neoplasm of breast: Secondary | ICD-10-CM

## 2011-03-01 LAB — CBC WITH DIFFERENTIAL/PLATELET
Eosinophils Absolute: 0.1 10*3/uL (ref 0.0–0.7)
HCT: 42.8 % (ref 36.0–46.0)
Hemoglobin: 14.2 g/dL (ref 12.0–15.0)
Lymphs Abs: 2 10*3/uL (ref 0.7–4.0)
MCHC: 33.2 g/dL (ref 30.0–36.0)
MCV: 91.5 fL (ref 78.0–100.0)
Monocytes Relative: 11 % (ref 3–12)
Platelets: 308 10*3/uL (ref 150–400)
RDW: 13.1 % (ref 11.5–15.5)

## 2011-03-01 NOTE — Progress Notes (Signed)
Subjective:    Patient ID: Stephanie Crawford, female    DOB: 10/26/1945, 65 y.o.   MRN: [id number]  HPI  Preventative- July 2011 bone density.  Last mammogram 2011.   Colonoscopy last July.  Reports walking occasionally.  Diet is good, eating lots of fiber.   Declines flu shot.    Had some dizziness/nausea a few weeks ago. Had a dull ache.  Dizzy with movement.  Has felt light headed since then.  Denies associated chest pain,  Or shortness of breath.  Does have have some sinus congestion.    GERD- she does report that she sometimes gets some "acid taste" in her mouth.  Wants to know a home remedy.   Review of Systems  Constitutional: Negative for unexpected weight change.  HENT: Negative for ear pain.   Eyes: Negative for visual disturbance.  Respiratory: Negative for shortness of breath.   Cardiovascular: Negative for chest pain and leg swelling.  Gastrointestinal: Negative for nausea, vomiting and diarrhea.       Last episode of rectal bleeding right after surgery.    Genitourinary: Negative for dysuria, frequency and vaginal discharge.  Musculoskeletal: Negative for myalgias.       Occasional aches in back/legs  Skin: Negative for rash.  Neurological: Negative for weakness and headaches.  Hematological: Negative for adenopathy.  Psychiatric/Behavioral:       Occasional numbness in hands.   Past Medical History  Diagnosis Date  . Allergy   . Anemia     prior to hysterectomy--1997  . GERD (gastroesophageal reflux disease)   . Heart murmur   . Hemorrhoid 2011    History   Social History  . Marital Status: Married    Spouse Name: N/A    Number of Children: N/A  . Years of Education: N/A   Occupational History  . Not on file.   Social History Main Topics  . Smoking status: Never Smoker   . Smokeless tobacco: Not on file  . Alcohol Use: Not on file  . Drug Use: Not on file  . Sexually Active: Not on file   Other Topics Concern  . Not on file   Social History  Narrative  . No narrative on file    Past Surgical History  Procedure Date  . Appendectomy 1960  . Abdominal hysterectomy 1997  . Lipoma excision 1975    neck, left breast and righ jaw.  . Hemorrhoid surgery 9/11    8/27 had a follow up procedure.      Family History  Problem Relation Age of Onset  . Cancer Father     prostate  . Asthma Daughter   . Cancer Maternal Grandmother     lung    No Known Allergies  No current outpatient prescriptions on file prior to visit.    BP 110/78  Pulse 78  Temp(Src) 98.3 F (36.8 C) (Oral)  Resp 16  Ht 5\' 9"  (1.753 m)  Wt 204 lb (92.534 kg)  BMI 30.13 kg/m2  LMP 05/23/1995        Objective:   Physical Exam  Constitutional: She is oriented to person, place, and time. She appears well-developed and well-nourished. No distress.  HENT:  Head: Normocephalic and atraumatic.  Mouth/Throat: No oropharyngeal exudate.  Eyes: Conjunctivae are normal. Pupils are equal, round, and reactive to light.  Neck: Neck supple. No thyromegaly present.  Cardiovascular: Normal rate and regular rhythm.   No murmur heard. Pulmonary/Chest: Effort normal and breath sounds normal. No respiratory  distress. She has no wheezes. She has no rales. She exhibits no tenderness.  Abdominal: Soft. Bowel sounds are normal. She exhibits no distension and no mass. There is no tenderness. There is no rebound and no guarding.  Genitourinary:       Breasts: Examined lying and sitting.  Right: Without masses, retractions, discharge or axillary adenopathy.  Left: Without masses, retractions, discharge or axillary adenopathy.  Inguinal/mons: Normal without inguinal adenopathy  External genitalia: Normal  BUS/Urethra/Skene's glands: Normal  Bladder: Normal  Uterus: surgically absent Vagina: Normal  Adnexa/parametria:  Rt: Without masses or tenderness.  Lt: Without masses or tenderness.  Anus and perineum: Normal    Musculoskeletal: She exhibits no edema.    Neurological: She is alert and oriented to person, place, and time.  Skin: Skin is warm and dry. No rash noted. No erythema. No pallor.  Psychiatric: She has a normal mood and affect. Her behavior is normal. Judgment and thought content normal.          Assessment & Plan:

## 2011-03-01 NOTE — Patient Instructions (Addendum)
Please schedule your mammogram on the first floor. Please complete your lab work on the first floor.  Follow up in 1 year, sooner if problems or concerns.

## 2011-03-01 NOTE — Assessment & Plan Note (Signed)
Given her episode a few weeks ago with left arm numbness and dizziness, and EKG today (I reviewed and not TWI in V2), will send for stress test.  In the meantime, she is instructed to go to the ED if she develops chest pain, left arm discomfort. She verbalizes understanding.

## 2011-03-01 NOTE — Assessment & Plan Note (Signed)
Reviewed reflux precautions.  If no improvement, I recommended that she try prilosec otc once daily.

## 2011-03-01 NOTE — Assessment & Plan Note (Signed)
Immunizations reviewed.  Tetanus up to date. Declines flu shot.  Will plan for zostavax/pneumovax next year.

## 2011-03-02 LAB — BASIC METABOLIC PANEL WITH GFR
Calcium: 9.9 mg/dL (ref 8.4–10.5)
Chloride: 104 mEq/L (ref 96–112)
Creat: 0.9 mg/dL (ref 0.50–1.10)
GFR, Est African American: >60 mL/min (ref >60)
Potassium: 4.5 mEq/L (ref 3.5–5.3)

## 2011-03-02 LAB — HEPATIC FUNCTION PANEL
AST: 20 U/L (ref 0–37)
Albumin: 4.5 g/dL (ref 3.5–5.2)
Indirect Bilirubin: 1.2 mg/dL — ABNORMAL HIGH (ref 0.0–0.9)
Total Bilirubin: 1.5 mg/dL — ABNORMAL HIGH (ref 0.3–1.2)
Total Protein: 6.9 g/dL (ref 6.0–8.3)

## 2011-03-02 LAB — LIPID PANEL: HDL: 57 mg/dL (ref >39)

## 2011-03-06 ENCOUNTER — Telehealth: Payer: Self-pay | Admitting: Family

## 2011-03-06 DIAGNOSIS — R17 Unspecified jaundice: Secondary | ICD-10-CM

## 2011-03-06 NOTE — Telephone Encounter (Signed)
Please call pt and let her know that her liver function testing is up a bit.  I would like for her to return for some additional blood work and an ultrasound of her liver.  Sometimes a "fatty liver" can cause this abnormality and this would be seen on ultrasound.

## 2011-03-06 NOTE — Telephone Encounter (Signed)
Call placed to patient at [phone], no answer. A detailed voice message was left informing patient per Sandford Craze instructions. Message was left for patient to call back with any questions. Labs printed and sent to [facility].

## 2011-03-08 ENCOUNTER — Ambulatory Visit (INDEPENDENT_AMBULATORY_CARE_PROVIDER_SITE_OTHER)
Admission: RE | Admit: 2011-03-08 | Discharge: 2011-03-08 | Disposition: A | Discharge status: Home / Self Care | Payer: BC Managed Care – PPO | Source: Ambulatory Visit | Attending: Family | Admitting: Family

## 2011-03-08 ENCOUNTER — Ambulatory Visit (HOSPITAL_BASED_OUTPATIENT_CLINIC_OR_DEPARTMENT_OTHER)
Admission: RE | Admit: 2011-03-08 | Discharge: 2011-03-08 | Disposition: A | Discharge status: Home / Self Care | Payer: BC Managed Care – PPO | Source: Ambulatory Visit | Attending: Family | Admitting: Family

## 2011-03-08 DIAGNOSIS — Z1231 Encounter for screening mammogram for malignant neoplasm of breast: Secondary | ICD-10-CM | POA: Insufficient documentation

## 2011-03-08 DIAGNOSIS — R17 Unspecified jaundice: Secondary | ICD-10-CM

## 2011-03-08 DIAGNOSIS — R945 Abnormal results of liver function studies: Secondary | ICD-10-CM

## 2011-03-10 ENCOUNTER — Encounter: Payer: Self-pay | Admitting: Family

## 2011-03-11 LAB — HEPATITIS C ANTIBODY: HCV Ab: NEGATIVE

## 2011-03-13 ENCOUNTER — Telehealth: Payer: Self-pay | Admitting: Family

## 2011-03-13 NOTE — Telephone Encounter (Signed)
Pls let pt know that her hepatitis studies are negative (Hepatitis B and C negative).

## 2011-03-13 NOTE — Telephone Encounter (Signed)
Left message on machine to return my call. 

## 2011-03-14 NOTE — Telephone Encounter (Signed)
Pt notified and asked for u/s result. Advised pt per Sandford Craze, NP that u/s is normal. No cause seen for elevated liver enzymes, will continue to monitor for normalization.

## 2011-03-15 ENCOUNTER — Ambulatory Visit (HOSPITAL_COMMUNITY): Payer: BC Managed Care – PPO | Attending: Family | Admitting: Radiology

## 2011-03-15 ENCOUNTER — Inpatient Hospital Stay: Admission: RE | Admit: 2011-03-15 | Payer: BC Managed Care – PPO | Source: Ambulatory Visit

## 2011-03-15 VITALS — Ht 69.0 in | Wt 203.0 lb

## 2011-03-15 DIAGNOSIS — R0609 Other forms of dyspnea: Secondary | ICD-10-CM

## 2011-03-15 DIAGNOSIS — R0989 Other specified symptoms and signs involving the circulatory and respiratory systems: Secondary | ICD-10-CM

## 2011-03-15 DIAGNOSIS — R209 Unspecified disturbances of skin sensation: Secondary | ICD-10-CM | POA: Insufficient documentation

## 2011-03-15 DIAGNOSIS — R0789 Other chest pain: Secondary | ICD-10-CM

## 2011-03-15 DIAGNOSIS — R110 Nausea: Secondary | ICD-10-CM | POA: Insufficient documentation

## 2011-03-15 DIAGNOSIS — R9431 Abnormal electrocardiogram [ECG] [EKG]: Secondary | ICD-10-CM

## 2011-03-15 DIAGNOSIS — R42 Dizziness and giddiness: Secondary | ICD-10-CM | POA: Insufficient documentation

## 2011-03-15 MED ORDER — TECHNETIUM TC 99M TETROFOSMIN IV KIT
11.0000 | PACK | Freq: Once | INTRAVENOUS | Status: AC | PRN
  Administered 2011-03-15: 11 via INTRAVENOUS

## 2011-03-15 MED ORDER — TECHNETIUM TC 99M TETROFOSMIN IV KIT
33.0000 | PACK | Freq: Once | INTRAVENOUS | Status: AC | PRN
  Administered 2011-03-15: 33 via INTRAVENOUS

## 2011-03-15 NOTE — Progress Notes (Signed)
[facility] SITE 3 NUCLEAR MED [address] [place] [address] [phone]  Cardiology Nuclear Med Study  Stephanie Crawford is a 65 y.o. female [id number] Jul 17, 1945   Nuclear Med Background Indication for Stress Test:  Evaluation for Ischemia and Abnormal EKG History:  No previous documented CAD Cardiac Risk Factors: Lipids  Symptoms:  Chest Pressure.  (last date of chest discomfort 2 days ago) with left arm numbness, Dizziness, DOE, Fatigue with Exertion, Light-Headedness and Nausea   Nuclear Pre-Procedure Caffeine/Decaff Intake:  None NPO After: 8:00am   Lungs:  Clear IV 0.9% NS with Angio Cath:  22g  IV Site: R Forearm  IV Started by:  Stanton Kidney, EMT-P  Chest Size (in):  38 Cup Size: B  Height: 5\' 9"  (1.753 m)  Weight:  203 lb (92.08 kg)  BMI:  Body mass index is 29.98 kg/(m^2). Tech Comments:  NA    Nuclear Med Study 1 or 2 day study: 1 day  Stress Test Type:  Stress  Reading MD: Willa Rough, MD  Order Authorizing Provider:  Charlynn Court, Montez Hageman, MD, Sandford Craze, NP  Resting Radionuclide: Technetium 57m Tetrofosmin  Resting Radionuclide Dose: 11.0 mCi   Stress Radionuclide:  Technetium 41m Tetrofosmin  Stress Radionuclide Dose: 33.0 mCi           Stress Protocol Rest HR: 74 Stress HR: 162  Rest BP: 130/82 Stress BP: 163/77  Exercise Time (min): 8:45 METS: 10.4   Predicted Max HR: 156 bpm % Max HR: 103.85 bpm Rate Pressure Product: 95638   Dose of Adenosine (mg):  n/a Dose of Lexiscan: n/a mg  Dose of Atropine (mg): n/a Dose of Dobutamine: n/a mcg/kg/min (at max HR)  Stress Test Technologist: Irean Hong, RN  Nuclear Technologist:  Domenic Polite, CNMT     Rest Procedure:  Myocardial perfusion imaging was performed at rest 45 minutes following the intravenous administration of Technetium 44m Tetrofosmin. Rest ECG: NSR with nonspecific T wave changes  Stress Procedure:  The patient exercised for 8 minutes and 45 seconds,  RPE=15.  The patient stopped due to DOE and denied any chest pain.  There were nonspecific ST-T wave changes. There was a slight decrease BP 151/65 immediately post exercise with sweating and slight nausea that subsided quickly. There was a rare PAC. Technetium 79m Tetrofosmin was injected at peak exercise and myocardial perfusion imaging was performed after a brief delay. Stress ECG: No significant change from baseline ECG  QPS Raw Data Images:  Patient motion noted; appropriate software correction applied. Stress Images:  Normal homogeneous uptake in all areas of the myocardium. Rest Images:  Normal homogeneous uptake in all areas of the myocardium. Subtraction (SDS):  No evidence of ischemia. Transient Ischemic Dilatation (Normal <1.22):  1.06 Lung/Heart Ratio (Normal <0.45):  0.33  Quantitative Gated Spect Images QGS EDV:  71 ml QGS ESV:  15 ml QGS cine images:  Normal Wall Motion QGS EF: 79%  Impression Exercise Capacity:  Good exercise capacity. BP Response:  Normal blood pressure response. Clinical Symptoms:  DOE ECG Impression:  No significant ST segment change suggestive of ischemia. Comparison with Prior Nuclear Study: No previous nuclear study performed  Overall Impression:  Normal stress nuclear study.  Willa Rough

## 2011-03-16 ENCOUNTER — Telehealth: Payer: Self-pay | Admitting: Family

## 2011-03-16 NOTE — Telephone Encounter (Signed)
Call placed to patient at [phone], no answer. A detailed voice message was left informing patient per Sandford Craze instruction.

## 2011-03-16 NOTE — Telephone Encounter (Signed)
Please call pt and let her know that her stress test is normal.

## 2011-05-11 ENCOUNTER — Encounter (INDEPENDENT_AMBULATORY_CARE_PROVIDER_SITE_OTHER): Payer: Self-pay | Admitting: Surgery

## 2011-05-11 ENCOUNTER — Ambulatory Visit (INDEPENDENT_AMBULATORY_CARE_PROVIDER_SITE_OTHER): Payer: BC Managed Care – PPO | Admitting: Surgery

## 2011-05-11 VITALS — BP 130/80 | HR 72 | Temp 98.4°F | Resp 12 | Ht 70.0 in | Wt 200.0 lb

## 2011-05-11 DIAGNOSIS — K625 Hemorrhage of anus and rectum: Secondary | ICD-10-CM

## 2011-05-11 NOTE — Progress Notes (Signed)
Subjective:     Patient ID: Stephanie Crawford, female   DOB: 1945/12/01, 65 y.o.   MRN: [id number]  HPI The patient presents to clinic today due to rectal bleeding. She underwent an exam under anesthesia back in July of this year due to persistent rectal bleeding. She has residual granular tissue that was cauterized. 2 weeks ago, she had one episode of bright red blood per rectum that filled the commode after a bowel movement. She denies any abdominal pain, nausea, vomiting or dizziness. She feels fine. She has had no other episodes since.   Review of Systems  Constitutional: Negative for fever, fatigue and unexpected weight change.  HENT: Negative.   Respiratory: Negative.   Cardiovascular: Negative.   Gastrointestinal: Positive for nausea, vomiting, blood in stool and rectal pain. Negative for diarrhea and constipation.       Objective:   Physical Exam  Constitutional: She appears well-developed and well-nourished.  Neck: Normal range of motion.  Pulmonary/Chest: Effort normal and breath sounds normal.  Genitourinary:       Rectal exam shows normal tone and no evidence of external hemorrhoids. Anoscopy was performed which showed no evidence of anal fissure, hemorrhoid or abscess. No bleeding noted.       Assessment:     Rectal bleeding    Plan:     This pattern of bleeding is not typical of anorectal disease. She had a colonoscopy in 2011 which showed diverticuli. I suspect her bleeding is from a diverticulitis point. She is asymptomatic and has had no further bleeding. I recommend observation for now unless a problem recurs.

## 2011-05-11 NOTE — Patient Instructions (Signed)
The bleeding is probably secondary to colonic diverticulosis noted on the last colonoscopy.  No evidence of anal source.  Follow for now.  Call if it recurs.

## 2011-12-25 ENCOUNTER — Ambulatory Visit (HOSPITAL_BASED_OUTPATIENT_CLINIC_OR_DEPARTMENT_OTHER)
Admission: RE | Admit: 2011-12-25 | Discharge: 2011-12-25 | Disposition: A | Discharge status: Home / Self Care | Payer: BC Managed Care – PPO | Source: Ambulatory Visit | Attending: Podiatry | Admitting: Podiatry

## 2011-12-25 ENCOUNTER — Other Ambulatory Visit (HOSPITAL_BASED_OUTPATIENT_CLINIC_OR_DEPARTMENT_OTHER): Payer: Self-pay | Admitting: Podiatry

## 2011-12-25 DIAGNOSIS — R52 Pain, unspecified: Secondary | ICD-10-CM

## 2011-12-25 DIAGNOSIS — S92919A Unspecified fracture of unspecified toe(s), initial encounter for closed fracture: Secondary | ICD-10-CM | POA: Diagnosis not present

## 2011-12-25 DIAGNOSIS — Z043 Encounter for examination and observation following other accident: Secondary | ICD-10-CM | POA: Diagnosis not present

## 2011-12-25 DIAGNOSIS — W19XXXA Unspecified fall, initial encounter: Secondary | ICD-10-CM | POA: Insufficient documentation

## 2012-07-11 ENCOUNTER — Ambulatory Visit (INDEPENDENT_AMBULATORY_CARE_PROVIDER_SITE_OTHER): Payer: Medicare Other | Admitting: Internal Medicine

## 2012-07-11 ENCOUNTER — Encounter: Payer: Self-pay | Admitting: Internal Medicine

## 2012-07-11 VITALS — BP 132/82 | HR 78 | Temp 98.5°F | Resp 18 | Ht 68.0 in | Wt 214.0 lb

## 2012-07-11 DIAGNOSIS — R21 Rash and other nonspecific skin eruption: Secondary | ICD-10-CM

## 2012-07-11 DIAGNOSIS — R17 Unspecified jaundice: Secondary | ICD-10-CM | POA: Diagnosis not present

## 2012-07-11 DIAGNOSIS — R011 Cardiac murmur, unspecified: Secondary | ICD-10-CM

## 2012-07-11 DIAGNOSIS — Z8679 Personal history of other diseases of the circulatory system: Secondary | ICD-10-CM

## 2012-07-11 LAB — COMPREHENSIVE METABOLIC PANEL
Alkaline Phosphatase: 101 U/L (ref 39–117)
BUN: 7 mg/dL (ref 6–23)
CO2: 31 mEq/L (ref 19–32)
Creat: 0.82 mg/dL (ref 0.50–1.10)
Glucose, Bld: 91 mg/dL (ref 70–99)
Sodium: 139 mEq/L (ref 135–145)
Total Bilirubin: 1.2 mg/dL (ref 0.3–1.2)

## 2012-07-11 LAB — CBC WITH DIFFERENTIAL/PLATELET
Basophils Relative: 2 % — ABNORMAL HIGH (ref 0–1)
Eosinophils Absolute: 0.2 10*3/uL (ref 0.0–0.7)
Eosinophils Relative: 3 % (ref 0–5)
Hemoglobin: 14.7 g/dL (ref 12.0–15.0)
Lymphs Abs: 3 10*3/uL (ref 0.7–4.0)
MCH: 30.1 pg (ref 26.0–34.0)
MCHC: 34.4 g/dL (ref 30.0–36.0)
MCV: 87.5 fL (ref 78.0–100.0)
Monocytes Relative: 11 % (ref 3–12)
Neutrophils Relative %: 33 % — ABNORMAL LOW (ref 43–77)
RBC: 4.88 MIL/uL (ref 3.87–5.11)

## 2012-07-11 MED ORDER — CEPHALEXIN 500 MG PO CAPS: ORAL_CAPSULE | ORAL | Status: DC

## 2012-07-11 MED ORDER — METHYLPREDNISOLONE ACETATE 80 MG/ML IJ SUSP
80.0000 mg | Freq: Once | INTRAMUSCULAR | Status: AC
  Administered 2012-07-11: 80 mg via INTRAMUSCULAR

## 2012-07-11 MED ORDER — FLUCONAZOLE 150 MG PO TABS: ORAL_TABLET | ORAL | Status: DC

## 2012-07-11 NOTE — Progress Notes (Signed)
Subjective:    Patient ID: Stephanie Crawford, female    DOB: April 07, 1946, 67 y.o.   MRN: [id number]  HPI  New pt here for first visit.  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  PMH elevated bilirubin with negative ultrasound,  Heart murmur.  She is S/P Hysterectomy.  She report she had an abnormal EKG but "stress test was fine"  She is concerned about an itchy rash that has been present for about 4 weeks.  She first noticed when she was swimming at the [place] -started as itchy rash .  She was using OTC HC but only became worse.  Pt. Denies using herbs, supplement,  She is on no meds.  No new OTC products  No Known Allergies Past Medical History  Diagnosis Date  . Allergy   . Anemia     prior to hysterectomy--1997  . GERD (gastroesophageal reflux disease)   . Heart murmur   . Hemorrhoid 2011   Past Surgical History  Procedure Laterality Date  . Appendectomy  1960  . Abdominal hysterectomy  1997  . Lipoma excision  1975    neck, left breast and righ jaw.  . Hemorrhoid surgery  9/11    8/27 had a follow up procedure.     History   Social History  . Marital Status: Married    Spouse Name: N/A    Number of Children: N/A  . Years of Education: N/A   Occupational History  . Not on file.   Social History Main Topics  . Smoking status: Never Smoker   . Smokeless tobacco: Not on file  . Alcohol Use: Not on file  . Drug Use: Not on file  . Sexually Active: Not on file   Other Topics Concern  . Not on file   Social History Narrative  . No narrative on file   Family History  Problem Relation Age of Onset  . Cancer Father     prostate  . Asthma Daughter   . Cancer Maternal Grandmother     lung   Patient Active Problem List  Diagnosis  . GERD  . OSTEOPENIA  . ANKLE EDEMA  . Abnormal EKG  . General medical examination  . Rectal bleeding  . Rash and nonspecific skin eruption   No current outpatient prescriptions on file prior to visit.   No current  facility-administered medications on file prior to visit.     Review of Systems    see HPI Objective:   Physical Exam Physical Exam  Nursing note and vitals reviewed.  Constitutional: She is oriented to person, place, and time. She appears well-developed and well-nourished.  HENT:  Head: Normocephalic and atraumatic.  Cardiovascular: Normal rate and regular rhythm. Exam reveals no gallop and no friction rub.  No murmur heard.  Pulmonary/Chest: Breath sounds normal. She has no wheezes. She has no rales.  Neurological: She is alert and oriented to person, place, and time.  Skin: Skin is warm and dry.  She has nummular shaped scaly lesion R anterior thigh.  Maculopapular  With mininal serosanguinous drainage.  No blisters Psychiatric: She has a normal mood and affect. Her behavior is normal.        Assessment & Plan:  Dermatitis  Fungal vs fixed drug eruption  Vs eczematous.  Will give Keflex for to prevent secondary bacterial infection.  Depo medrol 80 mg given in office.  Will also give 5 day course of diflucan.    Area covered with gel dressing for 3 days  only.  If no improvement will need derm referral.    See me in 7- 10 days.  Pt and husband voice understanding

## 2012-07-11 NOTE — Patient Instructions (Addendum)
Take meds as prescribed  See me in 7-10 days

## 2012-07-12 ENCOUNTER — Encounter: Payer: Self-pay | Admitting: Internal Medicine

## 2012-07-12 DIAGNOSIS — Z8679 Personal history of other diseases of the circulatory system: Secondary | ICD-10-CM | POA: Insufficient documentation

## 2012-07-12 DIAGNOSIS — R17 Unspecified jaundice: Secondary | ICD-10-CM | POA: Insufficient documentation

## 2012-07-15 ENCOUNTER — Telehealth: Payer: Self-pay | Admitting: *Deleted

## 2012-07-15 NOTE — Telephone Encounter (Signed)
Left message

## 2012-07-15 NOTE — Telephone Encounter (Signed)
Message copied by Mathews Robinsons on Mon Jul 15, 2012  2:17 PM ------      Message from: Raechel Chute D      Created: Fri Jul 12, 2012 12:11 PM       OK to mail to pt            Stephanie Crawford            I do not see a follow up appt for this pt.  Call pt and give her a follow up appt in one week            Message back with appt time ------

## 2012-07-17 ENCOUNTER — Telehealth: Payer: Self-pay | Admitting: *Deleted

## 2012-07-17 NOTE — Telephone Encounter (Signed)
Left messge.

## 2012-07-17 NOTE — Telephone Encounter (Signed)
Message copied by Mathews Robinsons on Wed Jul 17, 2012  8:56 AM ------      Message from: Raechel Chute D      Created: Fri Jul 12, 2012 12:11 PM       OK to mail to pt            Karen Kitchens            I do not see a follow up appt for this pt.  Call pt and give her a follow up appt in one week            Message back with appt time ------

## 2012-07-19 ENCOUNTER — Encounter: Payer: BC Managed Care – PPO | Admitting: Family

## 2012-07-24 ENCOUNTER — Telehealth: Payer: Self-pay | Admitting: Internal Medicine

## 2012-07-24 NOTE — Telephone Encounter (Signed)
Stephanie Crawford    Call pt. And ask how her rash is doing.  I note she did not schedule a follow up appt with me.  Give her an appt to see me in office

## 2012-07-29 ENCOUNTER — Emergency Department (HOSPITAL_BASED_OUTPATIENT_CLINIC_OR_DEPARTMENT_OTHER)
Admission: EM | Admit: 2012-07-29 | Discharge: 2012-07-29 | Disposition: A | Discharge status: Home / Self Care | Payer: 59 | Attending: Emergency Medicine | Admitting: Emergency Medicine

## 2012-07-29 ENCOUNTER — Telehealth: Payer: Self-pay | Admitting: Internal Medicine

## 2012-07-29 ENCOUNTER — Encounter (HOSPITAL_BASED_OUTPATIENT_CLINIC_OR_DEPARTMENT_OTHER): Payer: Self-pay

## 2012-07-29 DIAGNOSIS — R011 Cardiac murmur, unspecified: Secondary | ICD-10-CM | POA: Insufficient documentation

## 2012-07-29 DIAGNOSIS — Z8719 Personal history of other diseases of the digestive system: Secondary | ICD-10-CM | POA: Diagnosis not present

## 2012-07-29 DIAGNOSIS — R21 Rash and other nonspecific skin eruption: Secondary | ICD-10-CM | POA: Diagnosis not present

## 2012-07-29 DIAGNOSIS — Z862 Personal history of diseases of the blood and blood-forming organs and certain disorders involving the immune mechanism: Secondary | ICD-10-CM | POA: Diagnosis not present

## 2012-07-29 DIAGNOSIS — Z8679 Personal history of other diseases of the circulatory system: Secondary | ICD-10-CM | POA: Insufficient documentation

## 2012-07-29 MED ORDER — CLOTRIMAZOLE-BETAMETHASONE 1-0.05 % EX CREA: TOPICAL_CREAM | CUTANEOUS | Status: DC

## 2012-07-29 NOTE — ED Provider Notes (Signed)
History     CSN: [id number]  Arrival date & time 07/29/12  1744   None     Chief Complaint  Patient presents with  . Rash    (Consider location/radiation/quality/duration/timing/severity/associated sxs/prior treatment) HPI Stephanie Crawford is a 67 y.o. female who presents to ED with an area of rash to the right upper thigh. States started about a month ago after she started going to the gym to the swim class. States has seen her pcp who treated her with one time dose of steroid injection, fluconazole 5 day course, keflex 7 day course. States rash is worsening. States rash is mildly itchy, not painful. No drainage. No other rash anywhere on the body. No other complaints.    Past Medical History  Diagnosis Date  . Allergy   . Anemia     prior to hysterectomy--1997  . GERD (gastroesophageal reflux disease)   . Heart murmur   . Hemorrhoid 2011    Past Surgical History  Procedure Laterality Date  . Appendectomy  1960  . Abdominal hysterectomy  1997  . Lipoma excision  1975    neck, left breast and righ jaw.  . Hemorrhoid surgery  9/11    8/27 had a follow up procedure.      Family History  Problem Relation Age of Onset  . Cancer Father     prostate  . Asthma Daughter   . Cancer Maternal Grandmother     lung    History  Substance Use Topics  . Smoking status: Never Smoker   . Smokeless tobacco: Not on file  . Alcohol Use: No    OB History   Grav Para Term Preterm Abortions TAB SAB Ect Mult Living                  Review of Systems  Constitutional: Negative for fever and chills.  HENT: Negative for neck pain and neck stiffness.   Skin: Positive for rash.  Neurological: Negative for weakness, numbness and headaches.    Allergies  Review of patient's allergies indicates no known allergies.  Home Medications   Current Outpatient Rx  Name  Route  Sig  Dispense  Refill  . cephALEXin (KEFLEX) 500 MG capsule      Take one tablet po tid   30  capsule   0   . clotrimazole-betamethasone (LOTRISONE) cream      Apply to affected area 2 times daily prn   15 g   0   . fluconazole (DIFLUCAN) 150 MG tablet      One tab po for 5 days   5 tablet   0     BP 138/89  Pulse 77  Temp(Src) 98.7 F (37.1 C) (Oral)  Resp 16  Ht 5\' 10"  (1.778 m)  Wt 210 lb (95.255 kg)  BMI 30.13 kg/m2  SpO2 99%  LMP 05/23/1995  Physical Exam  Nursing note and vitals reviewed. Constitutional: She appears well-developed and well-nourished. No distress.  Eyes: Conjunctivae are normal.  Cardiovascular: Normal rate, regular rhythm and normal heart sounds.   Pulmonary/Chest: Effort normal and breath sounds normal. No respiratory distress. She has no wheezes. She has no rales.  Neurological: She is alert.  Skin: Skin is warm and dry.  Round patch about 4cm in diameter of a rash to the right upper anterior thigh. Area is raised, erythematous at a border, scaly in the middle. No drainage.   Psychiatric: She has a normal mood and affect.  ED Course  Procedures (including critical care time)  Labs Reviewed - No data to display No results found.   1. Rash       MDM  Rash most likely fungal will cover with topical anti fungal cream, with steroids. Follow up with dermatology. Pt otherwise non toxic. No systemic symptoms.         Lottie Mussel, PA-C 07/30/12 0131

## 2012-07-29 NOTE — Telephone Encounter (Signed)
Pt states she has a rash.. She is in between jobs and her availability is only 12-1 but it will take 20 mins to get to the office... She can only be here for seven mins... Pt was advise this information will be given to the nurse and doctor and she will be given a call... Please call pt on [phone]

## 2012-07-29 NOTE — ED Notes (Signed)
Rash to right upper thigh x 1 month-was treated by PCP with steroid injection and completed fluconazole and keflex-reports is no better

## 2012-07-31 NOTE — ED Provider Notes (Signed)
Medical screening examination/treatment/procedure(s) were performed by non-physician practitioner and as supervising physician I was immediately available for consultation/collaboration.  Ciin Brazzel M Drue Harr, MD 07/31/12 2212 

## 2012-08-24 ENCOUNTER — Telehealth: Payer: Self-pay | Admitting: Internal Medicine

## 2012-08-24 ENCOUNTER — Encounter: Payer: Self-pay | Admitting: Internal Medicine

## 2012-08-24 ENCOUNTER — Other Ambulatory Visit: Payer: Self-pay | Admitting: Internal Medicine

## 2012-08-24 NOTE — Telephone Encounter (Signed)
Pt has not been seen in follow up as advised.  Will send certified letter Monday April  7th

## 2012-09-19 ENCOUNTER — Encounter: Payer: Self-pay | Admitting: Internal Medicine

## 2012-09-19 ENCOUNTER — Telehealth: Payer: Self-pay | Admitting: Internal Medicine

## 2012-09-19 ENCOUNTER — Ambulatory Visit (INDEPENDENT_AMBULATORY_CARE_PROVIDER_SITE_OTHER): Payer: BC Managed Care – PPO | Admitting: Internal Medicine

## 2012-09-19 VITALS — BP 124/80 | HR 88 | Temp 97.4°F | Resp 18 | Wt 217.0 lb

## 2012-09-19 DIAGNOSIS — R17 Unspecified jaundice: Secondary | ICD-10-CM

## 2012-09-19 DIAGNOSIS — J302 Other seasonal allergic rhinitis: Secondary | ICD-10-CM | POA: Insufficient documentation

## 2012-09-19 DIAGNOSIS — J309 Allergic rhinitis, unspecified: Secondary | ICD-10-CM

## 2012-09-19 DIAGNOSIS — L989 Disorder of the skin and subcutaneous tissue, unspecified: Secondary | ICD-10-CM | POA: Diagnosis not present

## 2012-09-19 NOTE — Telephone Encounter (Signed)
Pt scheduled with Dr Sharyn Lull on Friday Oct 04, 2012 at 10:20 am.  Pt made aware of appt, time, date, & location. Stephanie Crawford

## 2012-09-19 NOTE — Progress Notes (Signed)
  Subjective:    Patient ID: Stephanie Crawford, female    DOB: Apr 19, 1946, 67 y.o.   MRN: [id number]  HPI Stephanie Crawford is here for follow up..  She states rash on R thigh still present but less dark.  It does not drain and is not itching as she is using a steroid creme.    She is having nasal congestion and sneezing some eye itching.  Some decrease hearing both ears But no pain  No Known Allergies Past Medical History  Diagnosis Date  . Allergy   . Anemia     prior to hysterectomy--1997  . GERD (gastroesophageal reflux disease)   . Heart murmur   . Hemorrhoid 2011   Past Surgical History  Procedure Laterality Date  . Appendectomy  1960  . Abdominal hysterectomy  1997  . Lipoma excision  1975    neck, left breast and righ jaw.  . Hemorrhoid surgery  9/11    8/27 had a follow up procedure.     History   Social History  . Marital Status: Married    Spouse Name: N/A    Number of Children: N/A  . Years of Education: N/A   Occupational History  . Not on file.   Social History Main Topics  . Smoking status: Never Smoker   . Smokeless tobacco: Not on file  . Alcohol Use: No  . Drug Use: Not on file  . Sexually Active: Yes   Other Topics Concern  . Not on file   Social History Narrative  . No narrative on file   Family History  Problem Relation Age of Onset  . Cancer Father     prostate  . Asthma Daughter   . Cancer Maternal Grandmother     lung   Patient Active Problem List   Diagnosis Date Noted  . Elevated bilirubin 07/12/2012  . History of cardiac murmur 07/12/2012  . Rash and nonspecific skin eruption 07/11/2012  . Rectal bleeding 05/11/2011  . Abnormal EKG 03/01/2011  . General medical examination 03/01/2011  . OSTEOPENIA 12/24/2009  . GERD 12/08/2009  . ANKLE EDEMA 12/08/2009   Current Outpatient Prescriptions on File Prior to Visit  Medication Sig Dispense Refill  . clotrimazole-betamethasone (LOTRISONE) cream Apply to affected area 2 times daily  prn  15 g  0   No current facility-administered medications on file prior to visit.       Review of Systems    see HPI Objective:   Physical Exam Physical Exam  Nursing note and vitals reviewed.  Constitutional: She is oriented to person, place, and time. She appears well-developed and well-nourished.  HENT: Tm's  Bilateral cerumen impaction Head: Normocephalic and atraumatic.  Cardiovascular: Normal rate and regular rhythm. Exam reveals no gallop and no friction rub.  No murmur heard.  Pulmonary/Chest: Breath sounds normal. She has no wheezes. She has no rales.  Neurological: She is alert and oriented to person, place, and time.  Skin: Skin is warm and dry. She has nummular shaped skin lesion  R thigh .  Appears less dark.  No scaling  Psychiatric: She has a normal mood and affect. Her behavior is normal.             Assessment & Plan:  Skin lesion will refer to Dermatology   Bilateral cerumen impaction  Will irrigate today  Elevated bilirubin  Normalized at most recent check  See me as needed

## 2012-09-19 NOTE — Patient Instructions (Addendum)
Schedule CPE  Will refer to dermatology

## 2012-10-08 DIAGNOSIS — L259 Unspecified contact dermatitis, unspecified cause: Secondary | ICD-10-CM | POA: Diagnosis not present

## 2012-10-08 DIAGNOSIS — L819 Disorder of pigmentation, unspecified: Secondary | ICD-10-CM | POA: Diagnosis not present

## 2012-10-08 DIAGNOSIS — L723 Sebaceous cyst: Secondary | ICD-10-CM | POA: Diagnosis not present

## 2012-10-08 DIAGNOSIS — D2370 Other benign neoplasm of skin of unspecified lower limb, including hip: Secondary | ICD-10-CM | POA: Diagnosis not present

## 2013-01-06 ENCOUNTER — Other Ambulatory Visit: Payer: Self-pay | Admitting: Internal Medicine

## 2013-01-06 ENCOUNTER — Encounter: Payer: Self-pay | Admitting: Internal Medicine

## 2013-01-06 ENCOUNTER — Ambulatory Visit (INDEPENDENT_AMBULATORY_CARE_PROVIDER_SITE_OTHER): Payer: Medicare Other | Admitting: Internal Medicine

## 2013-01-06 VITALS — BP 110/70 | HR 95 | Temp 97.8°F | Resp 18 | Wt 218.0 lb

## 2013-01-06 DIAGNOSIS — Z8679 Personal history of other diseases of the circulatory system: Secondary | ICD-10-CM

## 2013-01-06 DIAGNOSIS — E785 Hyperlipidemia, unspecified: Secondary | ICD-10-CM | POA: Diagnosis not present

## 2013-01-06 DIAGNOSIS — Z0000 Encounter for general adult medical examination without abnormal findings: Secondary | ICD-10-CM

## 2013-01-06 DIAGNOSIS — Z23 Encounter for immunization: Secondary | ICD-10-CM

## 2013-01-06 DIAGNOSIS — R9431 Abnormal electrocardiogram [ECG] [EKG]: Secondary | ICD-10-CM

## 2013-01-06 DIAGNOSIS — N6310 Unspecified lump in the right breast, unspecified quadrant: Secondary | ICD-10-CM

## 2013-01-06 DIAGNOSIS — N6459 Other signs and symptoms in breast: Secondary | ICD-10-CM

## 2013-01-06 DIAGNOSIS — N63 Unspecified lump in unspecified breast: Secondary | ICD-10-CM

## 2013-01-06 DIAGNOSIS — M899 Disorder of bone, unspecified: Secondary | ICD-10-CM | POA: Diagnosis not present

## 2013-01-06 DIAGNOSIS — J309 Allergic rhinitis, unspecified: Secondary | ICD-10-CM

## 2013-01-06 LAB — LIPID PANEL
HDL: 52 mg/dL (ref >39)
LDL Cholesterol: 118 mg/dL — ABNORMAL HIGH (ref 0–99)
Triglycerides: 123 mg/dL (ref <150)

## 2013-01-06 LAB — COMPREHENSIVE METABOLIC PANEL
Albumin: 4.2 g/dL (ref 3.5–5.2)
Alkaline Phosphatase: 87 U/L (ref 39–117)
BUN: 10 mg/dL (ref 6–23)
CO2: 29 mEq/L (ref 19–32)
Calcium: 9.9 mg/dL (ref 8.4–10.5)
Chloride: 106 mEq/L (ref 96–112)
Glucose, Bld: 89 mg/dL (ref 70–99)
Potassium: 4.2 mEq/L (ref 3.5–5.3)
Sodium: 141 mEq/L (ref 135–145)
Total Protein: 6.5 g/dL (ref 6.0–8.3)

## 2013-01-06 NOTE — Progress Notes (Signed)
Subjective:    Patient ID: Stephanie Crawford, female    DOB: [birth year], 67 y.o.   MRN: [id number]  HPI  Stephanie Crawford is here for CPE  Overall doing well  .  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    Stephanie Crawford works outside her home as her husband has had several small strokes.   She does have occasional dry mouth ( she is on no meds) and will have stress incontinence when she sneezes. Mild urge incontinence.  She does not wish meds for this as she would like to avoid medication    No Known Allergies Past Medical History  Diagnosis Date  . Allergy   . Anemia     prior to hysterectomy--1997  . GERD (gastroesophageal reflux disease)   . Heart murmur   . Hemorrhoid 2011   Past Surgical History  Procedure Laterality Date  . Appendectomy  1960  . Abdominal hysterectomy  1997  . Lipoma excision  1975    neck, left breast and righ jaw.  . Hemorrhoid surgery  9/11    8/27 had a follow up procedure.     History   Social History  . Marital Status: Married    Spouse Name: N/A    Number of Children: N/A  . Years of Education: N/A   Occupational History  . Not on file.   Social History Main Topics  . Smoking status: Never Smoker   . Smokeless tobacco: Not on file  . Alcohol Use: No  . Drug Use: Not on file  . Sexual Activity: Yes   Other Topics Concern  . Not on file   Social History Narrative  . No narrative on file   Family History  Problem Relation Age of Onset  . Cancer Father     prostate  . Asthma Daughter   . Cancer Maternal Grandmother     lung   Patient Active Problem List   Diagnosis Date Noted  . Other and unspecified hyperlipidemia 01/06/2013  . Breast thickening 01/06/2013  . Skin lesion of right leg 09/19/2012  . Allergic rhinitis 09/19/2012  . Elevated bilirubin 07/12/2012  . History of cardiac murmur 07/12/2012  . Rash and nonspecific skin eruption 07/11/2012  . Rectal bleeding 05/11/2011  . Abnormal EKG 03/01/2011  . General  medical examination 03/01/2011  . OSTEOPENIA 12/24/2009  . GERD 12/08/2009  . ANKLE EDEMA 12/08/2009   No current outpatient prescriptions on file prior to visit.   No current facility-administered medications on file prior to visit.      Review of Systems  Constitutional: Negative.  Negative for activity change.  HENT: Negative for trouble swallowing and voice change.   Eyes: Negative.   Respiratory: Negative.   Cardiovascular: Negative.   All other systems reviewed and are negative.       Objective:   Physical Exam Physical Exam  Nursing note and vitals reviewed.  Constitutional: She is oriented to person, place, and time. She appears well-developed and well-nourished.  HENT:  Head: Normocephalic and atraumatic.  Right Ear: Tympanic membrane and ear canal normal. No drainage. Tympanic membrane is not injected and not erythematous.  Left Ear: Tympanic membrane and ear canal normal. No drainage. Tympanic membrane is not injected and not erythematous.  Nose: Nose normal. Right sinus exhibits no maxillary sinus tenderness and no frontal sinus tenderness. Left sinus exhibits no maxillary sinus tenderness and no frontal sinus tenderness.  Mouth/Throat: Oropharynx is clear and moist. No oral lesions.  No oropharyngeal exudate.  Eyes: Conjunctivae and EOM are normal. Pupils are equal, round, and reactive to light.  Neck: Normal range of motion. Neck supple. No JVD present. Carotid bruit is not present. No mass and no thyromegaly present.  Cardiovascular: Normal rate, regular rhythm, S1 normal, S2 normal and intact distal pulses. Exam reveals no gallop and no friction rub.  No murmur heard.  Pulses:  Carotid pulses are 2+ on the right side, and 2+ on the left side.  Dorsalis pedis pulses are 2+ on the right side, and 2+ on the left side.  No carotid bruit. No LE edema  Pulmonary/Chest: Breath sounds normal. She has no wheezes. She has no rales. She exhibits no tenderness. Breasts:   She has thickening but no discrete mass R breast near her nipple 11-12 oclockL breast no discrete mass no nipple discharge no axillary adenopathy bilaterally Abdominal: Soft. Bowel sounds are normal. She exhibits no distension and no mass. There is no hepatosplenomegaly. There is no tenderness. There is no CVA tenderness.  Rectal no mass guaiac neg. Musculoskeletal: Normal range of motion.  No active synovitis to joints.  Lymphadenopathy:  She has no cervical adenopathy.  She has no axillary adenopathy.  Right: No inguinal and no supraclavicular adenopathy present.  Left: No inguinal and no supraclavicular adenopathy present.  Neurological: She is alert and oriented to person, place, and time. She has normal strength and normal reflexes. She displays no tremor. No cranial nerve deficit or sensory deficit. Coordination and gait normal.  Skin: Skin is warm and dry. No rash noted. No cyanosis. Nails show no clubbing.  Psychiatric: She has a normal mood and affect. Her speech is normal and behavior is normal. Cognition and memory are normal.           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  Advised both pneumovax and Shingles vaccine  Pt declines today.    R breast thickening  Will schedule diagnostic mm of her R breast  with her screening exam  Osteopenia  Will get Dexa with her mm  Elevated bilirubin  Follow up testing is normal   Will recheck today  Abnormal  EKG  She has normal nuclear study  Done 2012  History of rectal bleed no further bleeding after her hemorrhoidectomy by Dr. Luisa Hart

## 2013-01-06 NOTE — Patient Instructions (Addendum)
Activate my chart code  Will schedule breast ultrasound and bone density study

## 2013-01-07 ENCOUNTER — Encounter: Payer: Self-pay | Admitting: *Deleted

## 2013-01-13 ENCOUNTER — Telehealth: Payer: Self-pay | Admitting: Internal Medicine

## 2013-01-13 ENCOUNTER — Encounter: Payer: Self-pay | Admitting: Internal Medicine

## 2013-01-13 ENCOUNTER — Encounter: Payer: Self-pay | Admitting: *Deleted

## 2013-01-13 DIAGNOSIS — E559 Vitamin D deficiency, unspecified: Secondary | ICD-10-CM | POA: Insufficient documentation

## 2013-01-13 MED ORDER — CHOLECALCIFEROL 1.25 MG (50000 UT) PO TABS: 1.0000 | ORAL_TABLET | Freq: Every day | ORAL | Status: DC

## 2013-01-13 NOTE — Telephone Encounter (Signed)
Stephanie Crawford  Call [name] and let her know that her vitamin D level is very low .  I ordered Vitamin D 50,000 units to be taken once a week for 3 mlonths.   Sent to her pharmacy  Give pt appt for 3 mlonths from now to see me inoffice  OK to mail labs to her

## 2013-01-13 NOTE — Telephone Encounter (Signed)
Notified pt of Vit D results as well as medication and sig  Via VM

## 2013-01-29 ENCOUNTER — Telehealth: Payer: Self-pay | Admitting: Internal Medicine

## 2013-01-29 ENCOUNTER — Ambulatory Visit
Admission: RE | Admit: 2013-01-29 | Discharge: 2013-01-29 | Disposition: A | Discharge status: Home / Self Care | Payer: BC Managed Care – PPO | Source: Ambulatory Visit | Attending: Internal Medicine | Admitting: Internal Medicine

## 2013-01-29 ENCOUNTER — Other Ambulatory Visit: Payer: Self-pay | Admitting: Internal Medicine

## 2013-01-29 DIAGNOSIS — N6459 Other signs and symptoms in breast: Secondary | ICD-10-CM

## 2013-01-29 DIAGNOSIS — M899 Disorder of bone, unspecified: Secondary | ICD-10-CM

## 2013-01-29 NOTE — Telephone Encounter (Signed)
Spoke with pt and informed of U/S result

## 2013-01-30 ENCOUNTER — Telehealth: Payer: Self-pay | Admitting: *Deleted

## 2013-01-30 ENCOUNTER — Ambulatory Visit (INDEPENDENT_AMBULATORY_CARE_PROVIDER_SITE_OTHER): Payer: Medicare Other | Admitting: Internal Medicine

## 2013-01-30 ENCOUNTER — Encounter: Payer: Self-pay | Admitting: Internal Medicine

## 2013-01-30 VITALS — BP 126/76 | HR 93 | Temp 97.8°F | Resp 18 | Wt 217.0 lb

## 2013-01-30 DIAGNOSIS — M810 Age-related osteoporosis without current pathological fracture: Secondary | ICD-10-CM | POA: Diagnosis not present

## 2013-01-30 MED ORDER — IBANDRONATE SODIUM 150 MG PO TABS: 150.0000 mg | ORAL_TABLET | ORAL | Status: DC

## 2013-01-30 NOTE — Progress Notes (Signed)
Subjective:    Patient ID: Stephanie Crawford, female    DOB: [birth year], 67 y.o.   MRN: [id number]  HPI  Stephanie Crawford is here for follow up of osteoporosis  See dexa  L femur in the last 2 years now in osteoporotic range.  -2.0 in 2012  to now -2.6  AP spine T score also now in osteoporotic range.  Pt reports that she had been on Fosamax 2 years ago but frequently forgot to take her med once a week.  She would like to consider a medication less often  She has no impending dental extractions or dental issues.    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  She would like to try Boniva once a month and would like to have a second opinion regarding injectable options.   I also counseled regarding a drug holiday after 5 years of a bisphosphanate medication.  She voices understanding  No Known Allergies Past Medical History  Diagnosis Date  . Allergy   . Anemia     prior to hysterectomy--1997  . GERD (gastroesophageal reflux disease)   . Heart murmur   . Hemorrhoid 2011   Past Surgical History  Procedure Laterality Date  . Appendectomy  1960  . Abdominal hysterectomy  1997  . Lipoma excision  1975    neck, left breast and righ jaw.  . Hemorrhoid surgery  9/11    8/27 had a follow up procedure.     History   Social History  . Marital Status: Married    Spouse Name: N/A    Number of Children: N/A  . Years of Education: N/A   Occupational History  . Not on file.   Social History Main Topics  . Smoking status: Never Smoker   . Smokeless tobacco: Not on file  . Alcohol Use: No  . Drug Use: Not on file  . Sexual Activity: Yes   Other Topics Concern  . Not on file   Social History Narrative  . No narrative on file   Family History  Problem Relation Age of Onset  . Cancer Father     prostate  . Asthma Daughter   . Cancer Maternal Grandmother     lung   Patient Active Problem List   Diagnosis Date Noted  . Osteoporosis 01/30/2013  . Vitamin D deficiency 01/13/2013  . Other and unspecified hyperlipidemia 01/06/2013  . Breast thickening 01/06/2013  . Skin lesion of right leg 09/19/2012  . Allergic rhinitis 09/19/2012  . Elevated bilirubin 07/12/2012  . History of cardiac murmur 07/12/2012  . Rash and nonspecific skin eruption 07/11/2012  . Rectal bleeding 05/11/2011  . Abnormal EKG 03/01/2011  . General medical examination 03/01/2011  . OSTEOPENIA 12/24/2009  . GERD 12/08/2009  . ANKLE EDEMA 12/08/2009   Current Outpatient Prescriptions on File Prior to Visit  Medication Sig Dispense Refill  . Cholecalciferol 50000 UNITS TABS Take 1 tablet by mouth daily.  12 tablet  0   No current facility-administered medications on file prior to visit.      Review of Systems    see HPi Objective:   Physical Exam Physical Exam  Nursing note and vitals reviewed.  Constitutional: She is oriented to person, place, and time. She appears well-developed and well-nourished.  HENT:  Head: Normocephalic and atraumatic.  Cardiovascular: Normal rate and regular rhythm. Exam reveals no gallop and no friction rub.  No murmur heard.  Pulmonary/Chest: Breath  sounds normal. She has no wheezes. She has no rales.  Neurological: She is alert and oriented to person, place, and time.  Skin: Skin is warm and dry.  Psychiatric: She has a normal mood and affect. Her behavior is normal.          Assessment & Plan:  New onset osteoporosis: Pt would like to try Boniva monthly.  Counseled in great detail regarding SE profile.  Will also refer to rheumatology for opinion regarding injectable options.   See RX   She is to follow up with me in 3 months or sooner if unable tolerate medication

## 2013-01-30 NOTE — Telephone Encounter (Signed)
Message copied by Mathews Robinsons on Thu Jan 30, 2013  9:06 AM ------      Message from: Raechel Chute D      Created: Wed Jan 29, 2013  2:25 PM       [place]            Call pt and let her know that her bone density shows osteoporosis.  Give her 30 min appt to see me in office            Route back with date ------

## 2013-01-30 NOTE — Telephone Encounter (Signed)
Message copied by Mathews Robinsons on Thu Jan 30, 2013  9:00 AM ------      Message from: Raechel Chute D      Created: Wed Jan 29, 2013  2:25 PM       [place]            Call pt and let her know that her bone density shows osteoporosis.  Give her 30 min appt to see me in office            Route back with date ------

## 2013-01-30 NOTE — Telephone Encounter (Signed)
Notified pt of bone density appt made for

## 2013-01-30 NOTE — Telephone Encounter (Signed)
Notified pt of bone density results pt will come in today at 45

## 2013-01-30 NOTE — Telephone Encounter (Signed)
Boniva called in to [facility]

## 2013-01-31 ENCOUNTER — Telehealth: Payer: Self-pay | Admitting: *Deleted

## 2013-02-11 NOTE — Telephone Encounter (Signed)
error 

## 2013-03-27 ENCOUNTER — Other Ambulatory Visit: Payer: Self-pay

## 2013-04-09 ENCOUNTER — Ambulatory Visit (INDEPENDENT_AMBULATORY_CARE_PROVIDER_SITE_OTHER): Payer: BC Managed Care – PPO | Admitting: Internal Medicine

## 2013-04-09 ENCOUNTER — Encounter: Payer: Self-pay | Admitting: Internal Medicine

## 2013-04-09 VITALS — BP 121/77 | HR 91 | Temp 98.8°F | Resp 16

## 2013-04-09 DIAGNOSIS — M810 Age-related osteoporosis without current pathological fracture: Secondary | ICD-10-CM | POA: Diagnosis not present

## 2013-04-09 DIAGNOSIS — E559 Vitamin D deficiency, unspecified: Secondary | ICD-10-CM

## 2013-04-09 DIAGNOSIS — M899 Disorder of bone, unspecified: Secondary | ICD-10-CM | POA: Diagnosis not present

## 2013-04-09 NOTE — Progress Notes (Addendum)
Subjective:    Patient ID: Stephanie Crawford, female    DOB: [birth year], 67 y.o.   MRN: [id number]  HPI  Stephanie Crawford is here to follow up on her osteoprosis and vitamin D deficiency .  She just finished her weekly 50K vitamin D last week.  She is also taking Caltrate  I advised Boniva on our last visit but she has not started medication.  She is hesitant to start.  She will discuss with Dr. Corliss Skains  At her appt in January  No Known Allergies Past Medical History  Diagnosis Date  . Allergy   . Anemia     prior to hysterectomy--1997  . GERD (gastroesophageal reflux disease)   . Heart murmur   . Hemorrhoid 2011   Past Surgical History  Procedure Laterality Date  . Appendectomy  1960  . Abdominal hysterectomy  1997  . Lipoma excision  1975    neck, left breast and righ jaw.  . Hemorrhoid surgery  9/11    8/27 had a follow up procedure.     History   Social History  . Marital Status: Married    Spouse Name: N/A    Number of Children: N/A  . Years of Education: N/A   Occupational History  . Not on file.   Social History Main Topics  . Smoking status: Never Smoker   . Smokeless tobacco: Not on file  . Alcohol Use: No  . Drug Use: Not on file  . Sexual Activity: Yes   Other Topics Concern  . Not on file   Social History Narrative  . No narrative on file   Family History  Problem Relation Age of Onset  . Cancer Father     prostate  . Asthma Daughter   . Cancer Maternal Grandmother     lung   Patient Active Problem List   Diagnosis Date Noted  . Osteoporosis 01/30/2013  . Vitamin D deficiency 01/13/2013  . Other and unspecified hyperlipidemia 01/06/2013  . Breast thickening 01/06/2013  . Skin lesion of right leg 09/19/2012  . Allergic rhinitis 09/19/2012  . Elevated bilirubin 07/12/2012  . History of cardiac murmur 07/12/2012  . Rash and nonspecific skin eruption 07/11/2012  . Rectal bleeding 05/11/2011  . Abnormal EKG 03/01/2011  . General medical  examination 03/01/2011  . OSTEOPENIA 12/24/2009  . GERD 12/08/2009  . ANKLE EDEMA 12/08/2009   Current Outpatient Prescriptions on File Prior to Visit  Medication Sig Dispense Refill  . Cholecalciferol 50000 UNITS TABS Take 1 tablet by mouth daily.  12 tablet  0  . ibandronate (BONIVA) 150 MG tablet Take 1 tablet (150 mg total) by mouth every 30 (thirty) days. Take in the morning with a full glass of water, on an empty stomach, and do not take anything else by mouth or lie down for the next 30 min.  4 tablet  0   No current facility-administered medications on file prior to visit.      Review of Systems See HPI    Objective:   Physical Exam  Physical Exam  Nursing note and vitals reviewed.  Constitutional: She is oriented to person, place, and time. She appears well-developed and well-nourished.  HENT:  Head: Normocephalic and atraumatic.  Cardiovascular: Normal rate and regular rhythm. Exam reveals no gallop and no friction rub.  No murmur heard.  Pulmonary/Chest: Breath sounds normal. She has no wheezes. She has no rales.  Neurological: She is alert and oriented to person, place, and time.  Skin:  Skin is warm and dry.  Psychiatric: She has a normal mood and affect. Her behavior is normal.        Assessment & Plan:  Vitamin D deficiency  Will recheck today  She is advised daily vitmain D  1000 units.  Further management based on results  Osteoporosis  Advised Boniva as she does not think she can remember to take weekly medication.  She will discuss with rheumatologist   Addendum 06/09/2013  See Dr. Fatima Sanger note of 06/04/2013  Pt never started Boniva  And does not want any RXmeds

## 2013-04-09 NOTE — Patient Instructions (Signed)
Calcium  1000-1200 mg daily  Vitamin D  1000 units daily

## 2013-04-10 LAB — VITAMIN D 25 HYDROXY (VIT D DEFICIENCY, FRACTURES): Vit D, 25-Hydroxy: 35 ng/mL (ref 30–89)

## 2013-04-15 ENCOUNTER — Telehealth: Payer: Self-pay | Admitting: *Deleted

## 2013-04-15 ENCOUNTER — Encounter: Payer: Self-pay | Admitting: *Deleted

## 2013-04-15 NOTE — Telephone Encounter (Signed)
Message copied by Mathews Robinsons on Tue Apr 15, 2013  1:50 PM ------      Message from: Raechel Chute D      Created: Thu Apr 10, 2013 11:16 AM       Call pt and let her know that her vitamin D looks great .  She can just take the calcium and vitamin D as we discussed yesterday.              Ok to mail to pt ------

## 2013-04-15 NOTE — Telephone Encounter (Signed)
LVM message regarding pt Vit D labs mailed to pt home address.

## 2013-06-04 DIAGNOSIS — R5383 Other fatigue: Secondary | ICD-10-CM | POA: Diagnosis not present

## 2013-06-04 DIAGNOSIS — M810 Age-related osteoporosis without current pathological fracture: Secondary | ICD-10-CM | POA: Diagnosis not present

## 2013-06-04 DIAGNOSIS — E559 Vitamin D deficiency, unspecified: Secondary | ICD-10-CM | POA: Diagnosis not present

## 2013-06-04 DIAGNOSIS — M2550 Pain in unspecified joint: Secondary | ICD-10-CM | POA: Diagnosis not present

## 2013-06-04 DIAGNOSIS — R5381 Other malaise: Secondary | ICD-10-CM | POA: Diagnosis not present

## 2013-06-04 DIAGNOSIS — M5137 Other intervertebral disc degeneration, lumbosacral region: Secondary | ICD-10-CM | POA: Diagnosis not present

## 2013-06-04 DIAGNOSIS — Z79899 Other long term (current) drug therapy: Secondary | ICD-10-CM | POA: Diagnosis not present

## 2013-06-08 ENCOUNTER — Encounter: Payer: Self-pay | Admitting: Internal Medicine

## 2013-08-16 ENCOUNTER — Emergency Department (HOSPITAL_BASED_OUTPATIENT_CLINIC_OR_DEPARTMENT_OTHER)
Admission: EM | Admit: 2013-08-16 | Discharge: 2013-08-16 | Disposition: A | Discharge status: Home / Self Care | Payer: BC Managed Care – PPO | Attending: Emergency Medicine | Admitting: Emergency Medicine

## 2013-08-16 ENCOUNTER — Emergency Department (HOSPITAL_BASED_OUTPATIENT_CLINIC_OR_DEPARTMENT_OTHER): Payer: BC Managed Care – PPO

## 2013-08-16 ENCOUNTER — Encounter (HOSPITAL_BASED_OUTPATIENT_CLINIC_OR_DEPARTMENT_OTHER): Payer: Self-pay | Admitting: Emergency Medicine

## 2013-08-16 DIAGNOSIS — R61 Generalized hyperhidrosis: Secondary | ICD-10-CM | POA: Insufficient documentation

## 2013-08-16 DIAGNOSIS — Z79899 Other long term (current) drug therapy: Secondary | ICD-10-CM | POA: Diagnosis not present

## 2013-08-16 DIAGNOSIS — R079 Chest pain, unspecified: Secondary | ICD-10-CM | POA: Insufficient documentation

## 2013-08-16 DIAGNOSIS — Z8679 Personal history of other diseases of the circulatory system: Secondary | ICD-10-CM | POA: Diagnosis not present

## 2013-08-16 DIAGNOSIS — Z8719 Personal history of other diseases of the digestive system: Secondary | ICD-10-CM | POA: Diagnosis not present

## 2013-08-16 DIAGNOSIS — R110 Nausea: Secondary | ICD-10-CM | POA: Insufficient documentation

## 2013-08-16 DIAGNOSIS — Z862 Personal history of diseases of the blood and blood-forming organs and certain disorders involving the immune mechanism: Secondary | ICD-10-CM | POA: Diagnosis not present

## 2013-08-16 DIAGNOSIS — R011 Cardiac murmur, unspecified: Secondary | ICD-10-CM | POA: Insufficient documentation

## 2013-08-16 LAB — CBC WITH DIFFERENTIAL/PLATELET
BASOS ABS: 0 10*3/uL (ref 0.0–0.1)
Basophils Relative: 1 % (ref 0–1)
EOS ABS: 0 10*3/uL (ref 0.0–0.7)
Eosinophils Relative: 0 % (ref 0–5)
HCT: 43.4 % (ref 36.0–46.0)
Hemoglobin: 15.1 g/dL — ABNORMAL HIGH (ref 12.0–15.0)
LYMPHS ABS: 2.6 10*3/uL (ref 0.7–4.0)
LYMPHS PCT: 58 % — AB (ref 12–46)
MCH: 30.5 pg (ref 26.0–34.0)
MCHC: 34.8 g/dL (ref 30.0–36.0)
MCV: 87.7 fL (ref 78.0–100.0)
Monocytes Absolute: 0.7 10*3/uL (ref 0.1–1.0)
Monocytes Relative: 16 % — ABNORMAL HIGH (ref 3–12)
NEUTROS PCT: 25 % — AB (ref 43–77)
Neutro Abs: 1.2 10*3/uL — ABNORMAL LOW (ref 1.7–7.7)
PLATELETS: 184 10*3/uL (ref 150–400)
RBC: 4.95 MIL/uL (ref 3.87–5.11)
RDW: 12.6 % (ref 11.5–15.5)
WBC: 4.6 10*3/uL (ref 4.0–10.5)

## 2013-08-16 LAB — COMPREHENSIVE METABOLIC PANEL
ALK PHOS: 104 U/L (ref 39–117)
ALT: 46 U/L — ABNORMAL HIGH (ref 0–35)
AST: 51 U/L — AB (ref 0–37)
Albumin: 3.9 g/dL (ref 3.5–5.2)
BUN: 11 mg/dL (ref 6–23)
CO2: 23 mEq/L (ref 19–32)
Calcium: 9.7 mg/dL (ref 8.4–10.5)
Chloride: 102 mEq/L (ref 96–112)
Creatinine, Ser: 0.9 mg/dL (ref 0.50–1.10)
GFR calc non Af Amer: 65 mL/min — ABNORMAL LOW (ref >90)
GFR, EST AFRICAN AMERICAN: 75 mL/min — AB (ref >90)
GLUCOSE: 103 mg/dL — AB (ref 70–99)
POTASSIUM: 4 meq/L (ref 3.7–5.3)
SODIUM: 139 meq/L (ref 137–147)
TOTAL PROTEIN: 7.3 g/dL (ref 6.0–8.3)
Total Bilirubin: 1.2 mg/dL (ref 0.3–1.2)

## 2013-08-16 LAB — TROPONIN I
Troponin I: <0.3 ng/mL (ref <0.30)
Troponin I: <0.3 ng/mL (ref <0.30)

## 2013-08-16 MED ORDER — SODIUM CHLORIDE 0.9 % IV SOLN: INTRAVENOUS | Status: DC

## 2013-08-16 NOTE — ED Provider Notes (Signed)
CSN: [id number]     Arrival date & time 08/16/13  1426 History   First MD Initiated Contact with Patient 08/16/13 1500    This chart was scribed for Veryl Speak, MD by Terressa Koyanagi, ED Scribe. This patient was seen in room MH04/MH04 and the patient's care was started at 3:11 PM.  PCP: Kelton Pillar, MD  Chief Complaint  Patient presents with  . Chest Pain   The history is provided by the patient. No language interpreter was used.   HPI Comments: Stephanie Crawford is a 68 y.o. female, with a history of a heart murmur and GERD who presents to the Emergency Department complaining of intermittent centered chest pain onset around 12PM today while the pt was laying down. Pt reports that the duration of the pain was approximately 5 minutes and she experienced associated diaphoresis and nausea but denies SOB. Pt describes the pain as a pressure type of pain. Pt denies radiation of the pain. Pt also complains of associated intermittent diarrhea onset 2 days ago that resolved around 11AM today; and vomiting that resolved itself 2 days ago. Pt notes that she has been ill this week with a GI virus and it resolved today. Pt denies having DM or any existing cardiovascular diseases (except for a heart murmur). Pt reports she had a stress test done a while back. Pt denies any current discomfort.   Pt is a nonsmoker.   Past Medical History  Diagnosis Date  . Allergy   . Anemia     prior to hysterectomy--1997  . GERD (gastroesophageal reflux disease)   . Heart murmur   . Hemorrhoid 2011   Past Surgical History  Procedure Laterality Date  . Appendectomy  1960  . Abdominal hysterectomy  1997  . Lipoma excision  1975    neck, left breast and righ jaw.  . Hemorrhoid surgery  9/11    8/27 had a follow up procedure.     Family History  Problem Relation Age of Onset  . Cancer Father     prostate  . Asthma Daughter   . Cancer Maternal Grandmother     lung   History  Substance Use Topics  .  Smoking status: Never Smoker   . Smokeless tobacco: Not on file  . Alcohol Use: No   OB History   Grav Para Term Preterm Abortions TAB SAB Ect Mult Living                 Review of Systems  Constitutional: Positive for diaphoresis.  Respiratory: Negative for shortness of breath.   Cardiovascular: Positive for chest pain.  Gastrointestinal: Positive for nausea. Negative for vomiting and diarrhea.  All other systems reviewed and are negative.    A complete 10 system review of systems was obtained and all systems are negative except as noted in the HPI and PMH.    Allergies  Review of patient's allergies indicates no known allergies.  Home Medications   Current Outpatient Rx  Name  Route  Sig  Dispense  Refill  . Cholecalciferol 50000 UNITS TABS   Oral   Take 1 tablet by mouth daily.   12 tablet   0    BP 150/86  Pulse 87  Temp(Src) 98.7 F (37.1 C)  Resp 18  SpO2 100%  LMP 05/23/1995 Physical Exam  Nursing note and vitals reviewed. Constitutional: She is oriented to person, place, and time. She appears well-developed and well-nourished. No distress.  HENT:  Head: Normocephalic and atraumatic.  Eyes: EOM are normal.  Neck: Neck supple. No tracheal deviation present.  Cardiovascular: Normal rate.   Pulmonary/Chest: Effort normal. No respiratory distress.  Musculoskeletal: Normal range of motion. She exhibits no edema and no tenderness.  Neurological: She is alert and oriented to person, place, and time.  Skin: Skin is warm and dry.  Psychiatric: She has a normal mood and affect. Her behavior is normal.    ED Course  Procedures (including critical care time) DIAGNOSTIC STUDIES: Oxygen Saturation is 100% on RA, normal by my interpretation.    COORDINATION OF CARE:  3:16 PM-Discussed treatment plan which includes labs, imaging, EKG, and meds with pt at bedside and pt agreed to plan.   Labs Review Labs Reviewed  CBC WITH DIFFERENTIAL - Abnormal; Notable for  the following:    Hemoglobin 15.1 (*)    Neutrophils Relative % 25 (*)    Neutro Abs 1.2 (*)    Lymphocytes Relative 58 (*)    Monocytes Relative 16 (*)    All other components within normal limits  COMPREHENSIVE METABOLIC PANEL - Abnormal; Notable for the following:    Glucose, Bld 103 (*)    AST 51 (*)    ALT 46 (*)    GFR calc non Af Amer 65 (*)    GFR calc Af Amer 75 (*)    All other components within normal limits  TROPONIN I  TROPONIN I   Imaging Review Dg Chest 2 View  08/16/2013   CLINICAL DATA:  Chest pain.  EXAM: CHEST - 2 VIEW  COMPARISON:  DG CHEST 2 VIEW dated 01/27/2010  FINDINGS: The heart size and mediastinal contours are within normal limits. There is no evidence of pulmonary edema, consolidation, pneumothorax, nodule or pleural fluid. Stable mild degenerative changes are present of the thoracic spine.  IMPRESSION: No active disease.   Electronically Signed   By: Aletta Edouard M.D.   On: 08/16/2013 15:12     EKG Interpretation   Date/Time:  Saturday August 16 2013 14:34:40 EDT Ventricular Rate:  89 PR Interval:  152 QRS Duration: 70 QT Interval:  368 QTC Calculation: 447 R Axis:   28 Text Interpretation:  Normal sinus rhythm Possible Left atrial enlargement  Borderline ECG Confirmed by Zenia Resides  MD, ANTHONY ([id number]) on 08/16/2013  2:37:10 PM      MDM   Final diagnoses:  None    Patient is a 68 year old female with no prior cardiac history. She presents today after a 5 minute episode of chest discomfort which occurred at noon today. She states she was laying on the couch and developed this tightness. It resolved when she stood up. She denies any shortness of breath, nausea, diaphoresis, or radiation. She denies any recent exertional symptoms. Workup today reveals negative troponin x2 and EKG which is unremarkable. Her symptoms sound atypical for cardiac pain and the patient is eager for discharge. I believe this is a reasonable course of action. She understands  to return if her symptoms worsen or change. She will followup with her primary Dr. next week.  I personally performed the services described in this documentation, which was scribed in my presence. The recorded information has been reviewed and is accurate.      Veryl Speak, MD 08/16/13 [id number]

## 2013-08-16 NOTE — Discharge Instructions (Signed)
Followup with your primary Dr. next week and return to the ER for symptoms substantially worsen or change.   Chest Pain (Nonspecific) It is often hard to give a specific diagnosis for the cause of chest pain. There is always a chance that your pain could be related to something serious, such as a heart attack or a blood clot in the lungs. You need to follow up with your caregiver for further evaluation. CAUSES   Heartburn.  Pneumonia or bronchitis.  Anxiety or stress.  Inflammation around your heart (pericarditis) or lung (pleuritis or pleurisy).  A blood clot in the lung.  A collapsed lung (pneumothorax). It can develop suddenly on its own (spontaneous pneumothorax) or from injury (trauma) to the chest.  Shingles infection (herpes zoster virus). The chest wall is composed of bones, muscles, and cartilage. Any of these can be the source of the pain.  The bones can be bruised by injury.  The muscles or cartilage can be strained by coughing or overwork.  The cartilage can be affected by inflammation and become sore (costochondritis). DIAGNOSIS  Lab tests or other studies, such as X-rays, electrocardiography, stress testing, or cardiac imaging, may be needed to find the cause of your pain.  TREATMENT   Treatment depends on what may be causing your chest pain. Treatment may include:  Acid blockers for heartburn.  Anti-inflammatory medicine.  Pain medicine for inflammatory conditions.  Antibiotics if an infection is present.  You may be advised to change lifestyle habits. This includes stopping smoking and avoiding alcohol, caffeine, and chocolate.  You may be advised to keep your head raised (elevated) when sleeping. This reduces the chance of acid going backward from your stomach into your esophagus.  Most of the time, nonspecific chest pain will improve within 2 to 3 days with rest and mild pain medicine. HOME CARE INSTRUCTIONS   If antibiotics were prescribed, take your  antibiotics as directed. Finish them even if you start to feel better.  For the next few days, avoid physical activities that bring on chest pain. Continue physical activities as directed.  Do not smoke.  Avoid drinking alcohol.  Only take over-the-counter or prescription medicine for pain, discomfort, or fever as directed by your caregiver.  Follow your caregiver's suggestions for further testing if your chest pain does not go away.  Keep any follow-up appointments you made. If you do not go to an appointment, you could develop lasting (chronic) problems with pain. If there is any problem keeping an appointment, you must call to reschedule. SEEK MEDICAL CARE IF:   You think you are having problems from the medicine you are taking. Read your medicine instructions carefully.  Your chest pain does not go away, even after treatment.  You develop a rash with blisters on your chest. SEEK IMMEDIATE MEDICAL CARE IF:   You have increased chest pain or pain that spreads to your arm, neck, jaw, back, or abdomen.  You develop shortness of breath, an increasing cough, or you are coughing up blood.  You have severe back or abdominal pain, feel nauseous, or vomit.  You develop severe weakness, fainting, or chills.  You have a fever. THIS IS AN EMERGENCY. Do not wait to see if the pain will go away. Get medical help at once. Call your local emergency services (911 in [place] Do not drive yourself to the hospital. MAKE SURE YOU:   Understand these instructions.  Will watch your condition.  Will get help right away if you are  not doing well or get worse. Document Released: 02/15/2005 Document Revised: 07/31/2011 Document Reviewed: 12/12/2007 [facility] Patient Information 2014 [place].

## 2013-08-16 NOTE — ED Notes (Addendum)
Patient here with chest pain that started at noon, has had GI virus this week and was resting when the pain started, duration about 5 minutes with diaphoresis. No radiation, no other associated symptoms. Brother who died of MI in Feb 27, 2023. Had vomiting Thursday that resolved and diarrhea the past 2 days. Patient drove herself.

## 2013-08-16 NOTE — ED Notes (Signed)
Patient transported to X-ray 

## 2013-11-09 NOTE — Progress Notes (Signed)
   Subjective:    Patient ID: Stephanie Crawford, female    DOB: August 25, 1945, 68 y.o.   MRN: [id number]  HPI  Stephanie Crawford is here for acute visit.  Reports 8 weeks of dry cough  No chest pain  No SOB.  Occasional wheeze but rare .    Only meds are calcium and vitamin D   No Known Allergies Past Medical History  Diagnosis Date  . Allergy   . Anemia     prior to hysterectomy--1997  . GERD (gastroesophageal reflux disease)   . Heart murmur   . Hemorrhoid 2011   Past Surgical History  Procedure Laterality Date  . Appendectomy  1960  . Abdominal hysterectomy  1997  . Lipoma excision  1975    neck, left breast and righ jaw.  . Hemorrhoid surgery  9/11    8/27 had a follow up procedure.     History   Social History  . Marital Status: Married    Spouse Name: N/A    Number of Children: N/A  . Years of Education: N/A   Occupational History  . Not on file.   Social History Main Topics  . Smoking status: Never Smoker   . Smokeless tobacco: Not on file  . Alcohol Use: No  . Drug Use: Not on file  . Sexual Activity: Yes   Other Topics Concern  . Not on file   Social History Narrative  . No narrative on file   Family History  Problem Relation Age of Onset  . Cancer Father     prostate  . Asthma Daughter   . Cancer Maternal Grandmother     lung   Patient Active Problem List   Diagnosis Date Noted  . Osteoporosis 01/30/2013  . Vitamin D deficiency 01/13/2013  . Other and unspecified hyperlipidemia 01/06/2013  . Breast thickening 01/06/2013  . Skin lesion of right leg 09/19/2012  . Allergic rhinitis 09/19/2012  . Elevated bilirubin 07/12/2012  . History of cardiac murmur 07/12/2012  . Rash and nonspecific skin eruption 07/11/2012  . Rectal bleeding 05/11/2011  . Abnormal EKG 03/01/2011  . General medical examination 03/01/2011  . OSTEOPENIA 12/24/2009  . GERD 12/08/2009  . ANKLE EDEMA 12/08/2009   Current Outpatient Prescriptions on File Prior to Visit  Medication  Sig Dispense Refill  . Cholecalciferol 50000 UNITS TABS Take 1 tablet by mouth daily.  12 tablet  0   No current facility-administered medications on file prior to visit.      Review of Systems See HPI    Objective:   Physical Exam  Physical Exam  Nursing note and vitals reviewed.  Constitutional: She is oriented to person, place, and time. She appears well-developed and well-nourished.  HENT:  Head: Normocephalic and atraumatic.  Cardiovascular: Normal rate and regular rhythm. Exam reveals no gallop and no friction rub.  No murmur heard.  Pulmonary/Chest: Breath sounds normal. She has end -expiratory wheezing. She has no rales.  Neurological: She is alert and oriented to person, place, and time.  Skin: Skin is warm and dry.  Psychiatric: She has a normal mood and affect. Her behavior is normal.        Assessment & Plan:  Prolonged cough  Will give albuterol neb in office and get CXR.    tussionex and albuterol  MDI  tid   Bronchitis  Will give z-pak   See me in 5-7 days.

## 2013-11-10 ENCOUNTER — Ambulatory Visit (HOSPITAL_BASED_OUTPATIENT_CLINIC_OR_DEPARTMENT_OTHER)
Admission: RE | Admit: 2013-11-10 | Discharge: 2013-11-10 | Disposition: A | Discharge status: Home / Self Care | Payer: BC Managed Care – PPO | Source: Ambulatory Visit | Attending: Internal Medicine | Admitting: Internal Medicine

## 2013-11-10 ENCOUNTER — Encounter: Payer: Self-pay | Admitting: Internal Medicine

## 2013-11-10 ENCOUNTER — Ambulatory Visit (INDEPENDENT_AMBULATORY_CARE_PROVIDER_SITE_OTHER): Payer: BC Managed Care – PPO | Admitting: Internal Medicine

## 2013-11-10 ENCOUNTER — Telehealth: Payer: Self-pay | Admitting: *Deleted

## 2013-11-10 VITALS — BP 125/80 | HR 87 | Resp 16 | Ht 70.0 in | Wt 217.0 lb

## 2013-11-10 DIAGNOSIS — R05 Cough: Secondary | ICD-10-CM | POA: Insufficient documentation

## 2013-11-10 DIAGNOSIS — R059 Cough, unspecified: Secondary | ICD-10-CM | POA: Insufficient documentation

## 2013-11-10 DIAGNOSIS — R0602 Shortness of breath: Secondary | ICD-10-CM | POA: Insufficient documentation

## 2013-11-10 MED ORDER — HYDROCOD POLST-CHLORPHEN POLST 10-8 MG/5ML PO LQCR: 5.0000 mL | Freq: Two times a day (BID) | ORAL | Status: DC | PRN

## 2013-11-10 MED ORDER — ALBUTEROL SULFATE HFA 108 (90 BASE) MCG/ACT IN AERS: INHALATION_SPRAY | RESPIRATORY_TRACT | Status: DC

## 2013-11-10 MED ORDER — AZITHROMYCIN 250 MG PO TABS: ORAL_TABLET | ORAL | Status: DC

## 2013-11-10 NOTE — Telephone Encounter (Signed)
Called pt to adv No Pneumonia and to be sure to keep appt as scheduled for follow up - [facility]

## 2013-11-10 NOTE — Patient Instructions (Addendum)
To xray today      To pharmacy    See me in 5-7 days

## 2013-11-10 NOTE — Telephone Encounter (Signed)
Message copied by Gretchen Short on Mon Nov 10, 2013  3:47 PM ------      Message from: Emi Belfast D      Created: Mon Nov 10, 2013 12:58 PM       Call pt and let her know that her CXR does not show pneumonia   .  Keep F/U appt with me ------

## 2013-11-19 ENCOUNTER — Ambulatory Visit (INDEPENDENT_AMBULATORY_CARE_PROVIDER_SITE_OTHER): Payer: BC Managed Care – PPO | Admitting: Internal Medicine

## 2013-11-19 ENCOUNTER — Encounter: Payer: Self-pay | Admitting: Internal Medicine

## 2013-11-19 VITALS — BP 122/77 | HR 98 | Temp 98.3°F | Resp 16 | Ht 70.0 in | Wt 217.0 lb

## 2013-11-19 DIAGNOSIS — K219 Gastro-esophageal reflux disease without esophagitis: Secondary | ICD-10-CM | POA: Diagnosis not present

## 2013-11-19 DIAGNOSIS — R05 Cough: Secondary | ICD-10-CM | POA: Diagnosis not present

## 2013-11-19 DIAGNOSIS — R059 Cough, unspecified: Secondary | ICD-10-CM | POA: Diagnosis not present

## 2013-11-19 MED ORDER — PANTOPRAZOLE SODIUM 20 MG PO TBEC: 20.0000 mg | DELAYED_RELEASE_TABLET | Freq: Every day | ORAL | Status: DC

## 2013-11-19 NOTE — Progress Notes (Signed)
Subjective:    Patient ID: Stephanie Crawford, female    DOB: [birth year], 68 y.o.   MRN: [id number]  HPI Stephanie Crawford is here to follow up on her bronchitis presenting as a prolonged cough.  CXR neg .  She states she is 50% improved and notes she did have a bad allergy season.   Some heartburn at times but not daily   No Known Allergies Past Medical History  Diagnosis Date  . Allergy   . Anemia     prior to hysterectomy--1997  . GERD (gastroesophageal reflux disease)   . Heart murmur   . Hemorrhoid 2011   Past Surgical History  Procedure Laterality Date  . Appendectomy  1960  . Abdominal hysterectomy  1997  . Lipoma excision  1975    neck, left breast and righ jaw.  . Hemorrhoid surgery  9/11    8/27 had a follow up procedure.     History   Social History  . Marital Status: Married    Spouse Name: N/A    Number of Children: N/A  . Years of Education: N/A   Occupational History  . Not on file.   Social History Main Topics  . Smoking status: Never Smoker   . Smokeless tobacco: Not on file  . Alcohol Use: No  . Drug Use: Not on file  . Sexual Activity: Yes   Other Topics Concern  . Not on file   Social History Narrative  . No narrative on file   Family History  Problem Relation Age of Onset  . Cancer Father     prostate  . Asthma Daughter   . Cancer Maternal Grandmother     lung   Patient Active Problem List   Diagnosis Date Noted  . Osteoporosis 01/30/2013  . Vitamin D deficiency 01/13/2013  . Other and unspecified hyperlipidemia 01/06/2013  . Breast thickening 01/06/2013  . Skin lesion of right leg 09/19/2012  . Allergic rhinitis 09/19/2012  . Elevated bilirubin 07/12/2012  . History of cardiac murmur 07/12/2012  . Rash and nonspecific skin eruption 07/11/2012  . Rectal bleeding 05/11/2011  . Abnormal EKG 03/01/2011  . General medical examination 03/01/2011  . OSTEOPENIA 12/24/2009  . GERD 12/08/2009  . ANKLE EDEMA 12/08/2009   Current  Outpatient Prescriptions on File Prior to Visit  Medication Sig Dispense Refill  . albuterol (PROVENTIL HFA;VENTOLIN HFA) 108 (90 BASE) MCG/ACT inhaler Inhale 2 puffs every 8 hours as needed for cough  1 Inhaler  0  . azithromycin (ZITHROMAX) 250 MG tablet Take as directed  6 tablet  0  . calcium carbonate 1250 MG capsule Take 1,250 mg by mouth 2 (two) times daily with a meal.      . chlorpheniramine-HYDROcodone (TUSSIONEX PENNKINETIC ER) 10-8 MG/5ML LQCR Take 5 mLs by mouth every 12 (twelve) hours as needed for cough.  240 mL  0  . cholecalciferol (VITAMIN D) 1000 UNITS tablet Take 2,000 Units by mouth daily.       No current facility-administered medications on file prior to visit.       Review of Systems See HPI    Objective:   Physical Exam  Physical Exam  Nursing note and vitals reviewed.  Peak flow 470 Constitutional: She is oriented to person, place, and time. She appears well-developed and well-nourished.  HENT:  Head: Normocephalic and atraumatic.  Cardiovascular: Normal rate and regular rhythm. Exam reveals no gallop and no friction rub.  No murmur heard.  Pulmonary/Chest: Breath sounds normal.  She has no wheezes. She has no rales.  Neurological: She is alert and oriented to person, place, and time.  Skin: Skin is warm and dry.  Psychiatric: She has a normal mood and affect. Her behavior is normal.         Assessment & Plan:  Cough  Improving.   Continue OTC allergy med  I offered allergy testing referral but pt declines now  GERD  Ok to try Protonix 20 mg if not helping advised to stop

## 2013-11-20 ENCOUNTER — Ambulatory Visit: Payer: BC Managed Care – PPO | Admitting: Internal Medicine

## 2014-01-27 DIAGNOSIS — Z1231 Encounter for screening mammogram for malignant neoplasm of breast: Secondary | ICD-10-CM | POA: Diagnosis not present

## 2014-01-28 ENCOUNTER — Other Ambulatory Visit: Payer: Self-pay | Admitting: *Deleted

## 2014-01-28 ENCOUNTER — Ambulatory Visit (HOSPITAL_BASED_OUTPATIENT_CLINIC_OR_DEPARTMENT_OTHER)
Admission: RE | Admit: 2014-01-28 | Discharge: 2014-01-28 | Disposition: A | Discharge status: Home / Self Care | Payer: BC Managed Care – PPO | Source: Ambulatory Visit | Attending: Internal Medicine | Admitting: Internal Medicine

## 2014-01-28 ENCOUNTER — Encounter: Payer: Self-pay | Admitting: Internal Medicine

## 2014-01-28 ENCOUNTER — Ambulatory Visit (INDEPENDENT_AMBULATORY_CARE_PROVIDER_SITE_OTHER): Payer: BC Managed Care – PPO | Admitting: Internal Medicine

## 2014-01-28 VITALS — BP 122/71 | HR 93 | Temp 98.4°F | Resp 16 | Ht 68.5 in | Wt 217.0 lb

## 2014-01-28 DIAGNOSIS — M25469 Effusion, unspecified knee: Secondary | ICD-10-CM | POA: Insufficient documentation

## 2014-01-28 DIAGNOSIS — M25569 Pain in unspecified knee: Secondary | ICD-10-CM

## 2014-01-28 DIAGNOSIS — R6884 Jaw pain: Secondary | ICD-10-CM | POA: Diagnosis not present

## 2014-01-28 DIAGNOSIS — M810 Age-related osteoporosis without current pathological fracture: Secondary | ICD-10-CM | POA: Diagnosis not present

## 2014-01-28 DIAGNOSIS — M1710 Unilateral primary osteoarthritis, unspecified knee: Secondary | ICD-10-CM | POA: Diagnosis not present

## 2014-01-28 DIAGNOSIS — H9202 Otalgia, left ear: Secondary | ICD-10-CM

## 2014-01-28 DIAGNOSIS — M25561 Pain in right knee: Secondary | ICD-10-CM

## 2014-01-28 DIAGNOSIS — IMO0002 Reserved for concepts with insufficient information to code with codable children: Secondary | ICD-10-CM | POA: Diagnosis not present

## 2014-01-28 DIAGNOSIS — H9209 Otalgia, unspecified ear: Secondary | ICD-10-CM | POA: Diagnosis not present

## 2014-01-28 MED ORDER — AMOXICILLIN-POT CLAVULANATE 500-125 MG PO TABS: ORAL_TABLET | ORAL | Status: DC

## 2014-01-28 MED ORDER — PANTOPRAZOLE SODIUM 20 MG PO TBEC: 20.0000 mg | DELAYED_RELEASE_TABLET | Freq: Every day | ORAL | Status: DC

## 2014-01-28 MED ORDER — IBUPROFEN 800 MG PO TABS: ORAL_TABLET | ORAL | Status: DC

## 2014-01-28 NOTE — Patient Instructions (Signed)
Pt to call her dentist and notify of clinical condition  To xray and pharmacy today    Pt to call her rheumatolgist for appt

## 2014-01-28 NOTE — Progress Notes (Signed)
Subjective:    Patient ID: Stephanie Crawford, female    DOB: 1945-08-25, 68 y.o.   MRN: [id number]  HPI  Last OV  Cough Improving. Continue OTC allergy med I offered allergy testing referral but pt declines now  GERD Ok to try Protonix 20 mg if not helping advised to stop         Today's visit  Stephanie Crawford here for acute visit.  Had crown work done 4 weeks ago .  Permanent crown did not fit when she went to dentist visit last week so she still has temporary In place and is having jaw pain especially when she chews and opens her jaw.    Pain radiates to her left ear.   She tells me one week ago she did have a jaw xray  Done at dental office and was told it was OK.   She is not taking any meds for this and has not notified dentist  .  No fever no ear drainage   Also has swelling of right knee.  She may have twisted when seh got out of her car which is very low to the ground  No Known Allergies Past Medical History  Diagnosis Date  . Allergy   . Anemia     prior to hysterectomy--1997  . GERD (gastroesophageal reflux disease)   . Heart murmur   . Hemorrhoid 2011   Past Surgical History  Procedure Laterality Date  . Appendectomy  1960  . Abdominal hysterectomy  1997  . Lipoma excision  1975    neck, left breast and righ jaw.  . Hemorrhoid surgery  9/11    8/27 had a follow up procedure.     History   Social History  . Marital Status: Married    Spouse Name: N/A    Number of Children: N/A  . Years of Education: N/A   Occupational History  . Not on file.   Social History Main Topics  . Smoking status: Never Smoker   . Smokeless tobacco: Never Used  . Alcohol Use: No  . Drug Use: Not on file  . Sexual Activity: Yes   Other Topics Concern  . Not on file   Social History Narrative  . No narrative on file   Family History  Problem Relation Age of Onset  . Cancer Father     prostate  . Asthma Daughter   . Cancer Maternal Grandmother     lung   Patient Active  Problem List   Diagnosis Date Noted  . Osteoporosis 01/30/2013  . Vitamin D deficiency 01/13/2013  . Other and unspecified hyperlipidemia 01/06/2013  . Breast thickening 01/06/2013  . Skin lesion of right leg 09/19/2012  . Allergic rhinitis 09/19/2012  . Elevated bilirubin 07/12/2012  . History of cardiac murmur 07/12/2012  . Rash and nonspecific skin eruption 07/11/2012  . Rectal bleeding 05/11/2011  . Abnormal EKG 03/01/2011  . General medical examination 03/01/2011  . OSTEOPENIA 12/24/2009  . GERD 12/08/2009  . ANKLE EDEMA 12/08/2009   Current Outpatient Prescriptions on File Prior to Visit  Medication Sig Dispense Refill  . calcium carbonate 1250 MG capsule Take 1,250 mg by mouth 2 (two) times daily with a meal.      . cholecalciferol (VITAMIN D) 1000 UNITS tablet Take 2,000 Units by mouth daily.       No current facility-administered medications on file prior to visit.       Review of Systems    see HPI  Objective:   Physical Exam  Physical Exam  Nursing note and vitals reviewed.  Constitutional: She is oriented to person, place, and time. She appears well-developed and well-nourished.  HENT:  Head: Normocephalic and atraumatic.  TM: Serous effusion left ear  R TM  Normal landmarks  no edema  Neck no cervical adenopathy O/P  Difficult to visualize as she cannot open mouth widely due to pain   NO significant jaw edema Cardiovascular: Normal rate and regular rhythm. Exam reveals no gallop and no friction rub.  No murmur heard.  Pulmonary/Chest: Breath sounds normal. She has no wheezes. She has no rales.  Neurological: She is alert and oriented to person, place, and time.  M/S  She does have supra-patellar mild edema above . Skin: Skin is warm and dry.  Psychiatric: She has a normal mood and affect. Her behavior is normal.         Assessment & Plan:  Left ear pain and jaw pain:  Not sure of etiology but temporally related to dental work  Will empirically give  Augmentin bid 10 days with Ibuprofen 800 mg bid 10 days   Advised pt to inform dental (Dr. Ignacia Palma) of her lingering symptoms and ask for oral surgeon evaluation.  Jaw imaging done at dental office and pt states Ok   I do not have results She voices understanding and will call me if not better  Knee swelling  Will get plain imaging today and advised pt to call Dr. Estanislado Pandy if further edema as she may need knee tap.  Ibuprofen for now.  Pt voices understanding   Call me if not better

## 2014-01-29 NOTE — Progress Notes (Signed)
Spoke with pt and gave her the knee X-ray results.-eh

## 2014-02-11 ENCOUNTER — Encounter: Payer: Self-pay | Admitting: *Deleted

## 2014-04-21 DIAGNOSIS — Z8619 Personal history of other infectious and parasitic diseases: Secondary | ICD-10-CM

## 2014-04-21 HISTORY — DX: Personal history of other infectious and parasitic diseases: Z86.19

## 2014-04-30 ENCOUNTER — Ambulatory Visit (INDEPENDENT_AMBULATORY_CARE_PROVIDER_SITE_OTHER): Payer: BC Managed Care – PPO | Admitting: Internal Medicine

## 2014-04-30 ENCOUNTER — Encounter: Payer: Self-pay | Admitting: Internal Medicine

## 2014-04-30 VITALS — BP 132/77 | HR 82 | Resp 16 | Ht 68.5 in | Wt 220.0 lb

## 2014-04-30 DIAGNOSIS — M546 Pain in thoracic spine: Secondary | ICD-10-CM | POA: Diagnosis not present

## 2014-04-30 DIAGNOSIS — B029 Zoster without complications: Secondary | ICD-10-CM

## 2014-04-30 DIAGNOSIS — R21 Rash and other nonspecific skin eruption: Secondary | ICD-10-CM | POA: Diagnosis not present

## 2014-04-30 MED ORDER — VALACYCLOVIR HCL 1 G PO TABS: 1000.0000 mg | ORAL_TABLET | Freq: Two times a day (BID) | ORAL | Status: DC

## 2014-04-30 MED ORDER — HYDROCODONE-ACETAMINOPHEN 7.5-300 MG PO TABS: ORAL_TABLET | ORAL | Status: DC

## 2014-04-30 NOTE — Progress Notes (Signed)
   Subjective:    Patient ID: Stephanie Crawford, female    DOB: 1945/06/02, 68 y.o.   MRN: [id number]  HPI Acute visit   Painful blistery rash began on her back and now spread to front of chest  No Known Allergies Past Medical History  Diagnosis Date  . Allergy   . Anemia     prior to hysterectomy--1997  . GERD (gastroesophageal reflux disease)   . Heart murmur   . Hemorrhoid 2011   Past Surgical History  Procedure Laterality Date  . Appendectomy  1960  . Abdominal hysterectomy  1997  . Lipoma excision  1975    neck, left breast and righ jaw.  . Hemorrhoid surgery  9/11    8/27 had a follow up procedure.     History   Social History  . Marital Status: Married    Spouse Name: N/A    Number of Children: N/A  . Years of Education: N/A   Occupational History  . Not on file.   Social History Main Topics  . Smoking status: Never Smoker   . Smokeless tobacco: Never Used  . Alcohol Use: No  . Drug Use: Not on file  . Sexual Activity: Yes   Other Topics Concern  . Not on file   Social History Narrative   Family History  Problem Relation Age of Onset  . Cancer Father     prostate  . Asthma Daughter   . Cancer Maternal Grandmother     lung   Patient Active Problem List   Diagnosis Date Noted  . Osteoporosis 01/30/2013  . Vitamin D deficiency 01/13/2013  . Other and unspecified hyperlipidemia 01/06/2013  . Breast thickening 01/06/2013  . Skin lesion of right leg 09/19/2012  . Allergic rhinitis 09/19/2012  . Elevated bilirubin 07/12/2012  . History of cardiac murmur 07/12/2012  . Rash and nonspecific skin eruption 07/11/2012  . Rectal bleeding 05/11/2011  . Abnormal EKG 03/01/2011  . General medical examination 03/01/2011  . OSTEOPENIA 12/24/2009  . GERD 12/08/2009  . ANKLE EDEMA 12/08/2009   Current Outpatient Prescriptions on File Prior to Visit  Medication Sig Dispense Refill  . calcium carbonate 1250 MG capsule Take 1,250 mg by mouth 2 (two)  times daily with a meal.    . cholecalciferol (VITAMIN D) 1000 UNITS tablet Take 2,000 Units by mouth daily.    . pantoprazole (PROTONIX) 20 MG tablet Take 1 tablet (20 mg total) by mouth daily. 90 tablet 1   No current facility-administered medications on file prior to visit.       Review of Systems    see HPI Objective:   Physical Exam Physical Exam  Nursing note and vitals reviewed.  Constitutional: She is oriented to person, place, and time. She appears well-developed and well-nourished.  HENT:  Head: Normocephalic and atraumatic.  Cardiovascular: Normal rate and regular rhythm. Exam reveals no gallop and no friction rub.  No murmur heard.  Pulmonary/Chest: Breath sounds normal. She has no wheezes. She has no rales.  Neurological: She is alert and oriented to person, place, and time.  Skin: Skin is warm and dry.  Blistery rash along thoracic dermatome R side of chest wall Psychiatric: She has a normal mood and affect. Her behavior is normal.       Assessment & Plan:  Herpes zoster  Valtrex 1 gm bid 10 days   Thoracic pain  Vicodin 7.5/300 q8 h prn pain  See me in 2 weeks.

## 2014-04-30 NOTE — Patient Instructions (Signed)
See me 3rd week of December

## 2014-05-06 ENCOUNTER — Encounter: Payer: Self-pay | Admitting: *Deleted

## 2014-05-06 ENCOUNTER — Telehealth: Payer: Self-pay | Admitting: *Deleted

## 2014-05-06 ENCOUNTER — Telehealth: Payer: Self-pay

## 2014-05-06 NOTE — Telephone Encounter (Signed)
Rcvd fax from [employer] [facility]) for medical leave forms. Called pt to see what the reason is we rcvd this. There was no Dx listed on papers and the last time she was seen in office was for Shingles. If the reason for leave is Shingles then the pt can be written out until rash dries per Dr. Coralyn Mark.

## 2014-05-06 NOTE — Telephone Encounter (Signed)
Stephanie Crawford returned call and said FMLA was for Shingles - Her employer requires FMLA paperwork if your out. She has an appointment for Monday.

## 2014-05-10 NOTE — Progress Notes (Signed)
Subjective:    Patient ID: Stephanie Crawford, female    DOB: 1946-04-13, 68 y.o.   MRN: [id number]  HPI 04/2104 Herpes zoster Valtrex 1 gm bid 10 days   Thoracic pain Vicodin 7.5/300 q8 h prn pain  See me in 2 weeks.   TODAY:  Stephanie Crawford is here for follow up .  She had some epigastric chest pain for the last several days  Lasting for seconds,  No radiation to left arm or jaw.  No dyspnea no diaphoresis .  She stopped taking her  Protonix 1- days ago  Zoster  Rash had dried no drainage  Rare pain med  No Known Allergies Past Medical History  Diagnosis Date  . Allergy   . Anemia     prior to hysterectomy--1997  . GERD (gastroesophageal reflux disease)   . Heart murmur   . Hemorrhoid 2011   Past Surgical History  Procedure Laterality Date  . Appendectomy  1960  . Abdominal hysterectomy  1997  . Lipoma excision  1975    neck, left breast and righ jaw.  . Hemorrhoid surgery  9/11    8/27 had a follow up procedure.     History   Social History  . Marital Status: Married    Spouse Name: N/A    Number of Children: N/A  . Years of Education: N/A   Occupational History  . Not on file.   Social History Main Topics  . Smoking status: Never Smoker   . Smokeless tobacco: Never Used  . Alcohol Use: No  . Drug Use: Not on file  . Sexual Activity: Yes   Other Topics Concern  . Not on file   Social History Narrative   Family History  Problem Relation Age of Onset  . Cancer Father     prostate  . Asthma Daughter   . Cancer Maternal Grandmother     lung   Patient Active Problem List   Diagnosis Date Noted  . Osteoporosis 01/30/2013  . Vitamin D deficiency 01/13/2013  . Other and unspecified hyperlipidemia 01/06/2013  . Breast thickening 01/06/2013  . Skin lesion of right leg 09/19/2012  . Allergic rhinitis 09/19/2012  . Elevated bilirubin 07/12/2012  . History of cardiac murmur 07/12/2012  . Rash and nonspecific skin eruption 07/11/2012  . Rectal  bleeding 05/11/2011  . Abnormal EKG 03/01/2011  . General medical examination 03/01/2011  . OSTEOPENIA 12/24/2009  . GERD 12/08/2009  . ANKLE EDEMA 12/08/2009   Current Outpatient Prescriptions on File Prior to Visit  Medication Sig Dispense Refill  . calcium carbonate 1250 MG capsule Take 1,250 mg by mouth 2 (two) times daily with a meal.    . cholecalciferol (VITAMIN D) 1000 UNITS tablet Take 2,000 Units by mouth daily.    . Hydrocodone-Acetaminophen (VICODIN ES) 7.5-300 MG TABS Take one every 6 hours as needed for pain 30 each 0  . pantoprazole (PROTONIX) 20 MG tablet Take 1 tablet (20 mg total) by mouth daily. 90 tablet 1  . valACYclovir (VALTREX) 1000 MG tablet Take 1 tablet (1,000 mg total) by mouth 2 (two) times daily. 20 tablet 0   No current facility-administered medications on file prior to visit.       Review of Systems See HPI    Objective:   Physical Exam Physical Exam  Nursing note and vitals reviewed.  Constitutional: She is oriented to person, place, and time. She appears well-developed and well-nourished.  HENT:  Head: Normocephalic and atraumatic.  Cardiovascular:  Normal rate and regular rhythm. Exam reveals no gallop and no friction rub.  No murmur heard.  Pulmonary/Chest: Breath sounds normal. She has no wheezes. She has no rales.  Neurological: She is alert and oriented to person, place, and time.  Skin: Skin is warm and dry.   Vesicular dermatomal rash now dry Psychiatric: She has a normal mood and affect. Her behavior is normal.           Assessment & Plan:  Epigstric pain  EKG no change from March.  Advised to take protonix daily  GERD  See above   Zoster  Improved  Ok t return to work

## 2014-05-11 ENCOUNTER — Ambulatory Visit (INDEPENDENT_AMBULATORY_CARE_PROVIDER_SITE_OTHER): Payer: BLUE CROSS/BLUE SHIELD | Admitting: Internal Medicine

## 2014-05-11 ENCOUNTER — Encounter: Payer: Self-pay | Admitting: Internal Medicine

## 2014-05-11 VITALS — BP 126/69 | HR 74 | Temp 99.0°F | Resp 16 | Ht 69.0 in | Wt 219.0 lb

## 2014-05-11 DIAGNOSIS — R0789 Other chest pain: Secondary | ICD-10-CM | POA: Diagnosis not present

## 2014-05-11 DIAGNOSIS — R1013 Epigastric pain: Secondary | ICD-10-CM | POA: Diagnosis not present

## 2014-05-11 DIAGNOSIS — B029 Zoster without complications: Secondary | ICD-10-CM

## 2014-05-11 NOTE — Patient Instructions (Signed)
See me as needed 

## 2014-05-13 ENCOUNTER — Ambulatory Visit: Payer: BC Managed Care – PPO | Admitting: Internal Medicine

## 2014-06-03 DIAGNOSIS — M5137 Other intervertebral disc degeneration, lumbosacral region: Secondary | ICD-10-CM | POA: Diagnosis not present

## 2014-06-03 DIAGNOSIS — M19041 Primary osteoarthritis, right hand: Secondary | ICD-10-CM | POA: Diagnosis not present

## 2014-06-03 DIAGNOSIS — M810 Age-related osteoporosis without current pathological fracture: Secondary | ICD-10-CM | POA: Diagnosis not present

## 2014-06-15 ENCOUNTER — Encounter: Payer: Self-pay | Admitting: Internal Medicine

## 2014-07-02 ENCOUNTER — Telehealth: Payer: Self-pay | Admitting: *Deleted

## 2014-07-02 NOTE — Telephone Encounter (Signed)
Stephanie Crawford called and said that she thinks she has an infection on her right foot. I advised her to go to an Urgent care since Dr. Emiliano Dyer would not be back in the office until Monday

## 2014-07-04 ENCOUNTER — Emergency Department (INDEPENDENT_AMBULATORY_CARE_PROVIDER_SITE_OTHER)
Admission: EM | Admit: 2014-07-04 | Discharge: 2014-07-04 | Disposition: A | Discharge status: Home / Self Care | Payer: BLUE CROSS/BLUE SHIELD | Source: Home / Self Care | Attending: Family Medicine | Admitting: Family Medicine

## 2014-07-04 ENCOUNTER — Encounter: Payer: Self-pay | Admitting: Emergency Medicine

## 2014-07-04 DIAGNOSIS — L03115 Cellulitis of right lower limb: Secondary | ICD-10-CM | POA: Diagnosis not present

## 2014-07-04 HISTORY — DX: Personal history of other infectious and parasitic diseases: Z86.19

## 2014-07-04 MED ORDER — CEPHALEXIN 500 MG PO CAPS: 500.0000 mg | ORAL_CAPSULE | Freq: Two times a day (BID) | ORAL | Status: DC

## 2014-07-04 MED ORDER — TERBINAFINE HCL 250 MG PO TABS: 250.0000 mg | ORAL_TABLET | Freq: Every day | ORAL | Status: DC

## 2014-07-04 MED ORDER — DOMEBORO 25 % EX PACK: PACK | CUTANEOUS | Status: DC

## 2014-07-04 NOTE — Discharge Instructions (Signed)
Leave today's bandage in place until tomorrow, then change bandage daily with non-stick dressing such as Telfa.  Begin Domeboro soak 2 or 3 times daily until weeping ceases.   Cellulitis Cellulitis is an infection of the skin and the tissue beneath it. The infected area is usually red and tender. Cellulitis occurs most often in the arms and lower legs.  CAUSES  Cellulitis is caused by bacteria that enter the skin through cracks or cuts in the skin. The most common types of bacteria that cause cellulitis are staphylococci and streptococci. SIGNS AND SYMPTOMS   Redness and warmth.  Swelling.  Tenderness or pain.  Fever. DIAGNOSIS  Your health care provider can usually determine what is wrong based on a physical exam. Blood tests may also be done. TREATMENT  Treatment usually involves taking an antibiotic medicine. HOME CARE INSTRUCTIONS   Take your antibiotic medicine as directed by your health care provider. Finish the antibiotic even if you start to feel better.  Keep the infected arm or leg elevated to reduce swelling.  Apply a warm cloth to the affected area up to 4 times per day to relieve pain.  Take medicines only as directed by your health care provider.  Keep all follow-up visits as directed by your health care provider. SEEK MEDICAL CARE IF:   You notice red streaks coming from the infected area.  Your red area gets larger or turns dark in color.  Your bone or joint underneath the infected area becomes painful after the skin has healed.  Your infection returns in the same area or another area.  You notice a swollen bump in the infected area.  You develop new symptoms.  You have a fever. SEEK IMMEDIATE MEDICAL CARE IF:   You feel very sleepy.  You develop vomiting or diarrhea.  You have a general ill feeling (malaise) with muscle aches and pains. MAKE SURE YOU:   Understand these instructions.  Will watch your condition.  Will get help right away if  you are not doing well or get worse. Document Released: 02/15/2005 Document Revised: 09/22/2013 Document Reviewed: 07/24/2011 [facility] Patient Information 2015 [place], [place]. This information is not intended to replace advice given to you by your health care provider. Make sure you discuss any questions you have with your health care provider.

## 2014-07-04 NOTE — ED Notes (Signed)
Reports 2 week history of progressive worsening sores on right foot; had hx of calusses and self treatments; has spread from instep to heel and across top of foot to toes; weeping drainage.

## 2014-07-04 NOTE — ED Provider Notes (Signed)
CSN: [id number]     Arrival date & time 07/04/14  1510 History   First MD Initiated Contact with Patient 07/04/14 1540     Chief Complaint  Patient presents with  . Wound Infection      HPI Comments: Patient reports a 20 year history of recurring dermatitis of her right foot.  She reports that she has persistent coarse thickened callus on the medial aspect of her foot extending to the right plantar surface.  She often must abrade and trim the excess hyperkeratotic skin.   Two weeks ago she developed increased irritation of the lesions on her foot and noticed soreness between her first and second toes.  She tried applying a topical antifungal preparation without improvement.  Over the past two days she has had weeping of clear fluid from the lesions on her medial foot, and erythema and moistness between the first and second toes.  She denies significant pain.  No fevers, chills, and sweats   Patient is a 69 y.o. female presenting with rash. The history is provided by the patient.  Rash Location:  Foot Foot rash location:  Sole of R foot and R toes Quality: redness and weeping   Quality: not painful   Severity:  Moderate Onset quality:  Gradual Duration:  2 weeks Timing:  Constant Progression:  Worsening Chronicity:  New Context: not chemical exposure, not insect bite/sting, not medications and not plant contact   Relieved by:  Nothing Associated symptoms: no fatigue, no fever, no joint pain and no nausea     Past Medical History  Diagnosis Date  . Allergy   . Anemia     prior to hysterectomy--1997  . GERD (gastroesophageal reflux disease)   . Heart murmur   . Hemorrhoid 2011  . History of shingles 04/2014   Past Surgical History  Procedure Laterality Date  . Appendectomy  1960  . Abdominal hysterectomy  1997  . Lipoma excision  1975    neck, left breast and righ jaw.  . Hemorrhoid surgery  9/11    8/27 had a follow up procedure.     Family History  Problem Relation Age  of Onset  . Cancer Father     prostate  . Asthma Daughter   . Cancer Maternal Grandmother     lung   History  Substance Use Topics  . Smoking status: Never Smoker   . Smokeless tobacco: Never Used  . Alcohol Use: No   OB History    No data available     Review of Systems  Constitutional: Negative for fever and fatigue.  Gastrointestinal: Negative for nausea.  Musculoskeletal: Negative for arthralgias.  Skin: Positive for rash.  All other systems reviewed and are negative.   Allergies  Review of patient's allergies indicates no known allergies.  Home Medications   Prior to Admission medications   Medication Sig Start Date End Date Taking? Authorizing Provider  Alum Sulfate-Ca Acetate (DOMEBORO) 25 % PACK Dissolve in water and apply as a compress TID 07/04/14   Kandra Nicolas, MD  calcium carbonate 1250 MG capsule Take 1,250 mg by mouth 2 (two) times daily with a meal.    Historical Provider, MD  cephALEXin (KEFLEX) 500 MG capsule Take 1 capsule (500 mg total) by mouth 2 (two) times daily. 07/04/14   Kandra Nicolas, MD  cholecalciferol (VITAMIN D) 1000 UNITS tablet Take 2,000 Units by mouth daily.    Historical Provider, MD  Hydrocodone-Acetaminophen (VICODIN ES) 7.5-300 MG TABS Take  one every 6 hours as needed for pain 04/30/14   Lanice Shirts, MD  pantoprazole (PROTONIX) 20 MG tablet Take 1 tablet (20 mg total) by mouth daily. 01/28/14   Lanice Shirts, MD  terbinafine (LAMISIL) 250 MG tablet Take 1 tablet (250 mg total) by mouth daily. 07/04/14   Kandra Nicolas, MD   BP 126/82 mmHg  Pulse 97  Temp(Src) 98.3 F (36.8 C) (Oral)  Resp 16  Ht 5\' 9"  (1.753 m)  Wt 205 lb (92.987 kg)  BMI 30.26 kg/m2  SpO2 96%  LMP 05/23/1995 Physical Exam  Constitutional: She is oriented to person, place, and time. She appears well-developed and well-nourished. No distress.  HENT:  Head: Normocephalic.  Mouth/Throat: Oropharynx is clear and moist.  Eyes: Conjunctivae are  normal. Pupils are equal, round, and reactive to light.  Musculoskeletal:       Right foot: There is normal range of motion, no bony tenderness, no swelling and normal capillary refill.       Feet:  Medial aspect of right foot has hyperkeratotic epidermis weeping honey-colored clear fluid.  Minimal tenderness to palpation.  The first and second toes are weeping, erythematous, and macerated between each other  as noted on diagram.  There is no warmth or swelling.     Neurological: She is alert and oriented to person, place, and time.  Skin: Skin is warm and dry.  Nursing note and vitals reviewed.   ED Course  Procedures  None   Labs Reviewed  WOUND CULTURE FUNGAL CULTURE KOH exam of scrapings from right foot lesions reveal branching hyphae         MDM   1. 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    Wound culture and fungal cultures pending.  Begin empiric terbinafine 250mg  once daily for two weeks, and Keflex 500mg  BID for 10 days. Weeping areas on right foot dressed with iodoform gauze and overlying bandage. Leave today's bandage in place until tomorrow, then change bandage daily with non-stick dressing such as Telfa.  Begin Domeboro soak 2 or 3 times daily until weeping ceases. Followup with dermatologist if not improved two weeks.    Kandra Nicolas, MD 07/05/14 [phone]

## 2014-07-08 ENCOUNTER — Telehealth: Payer: Self-pay | Admitting: Emergency Medicine

## 2014-07-08 LAB — WOUND CULTURE
Gram Stain: NONE SEEN
Gram Stain: NONE SEEN

## 2014-07-09 ENCOUNTER — Telehealth: Payer: Self-pay | Admitting: Emergency Medicine

## 2014-07-14 ENCOUNTER — Ambulatory Visit (INDEPENDENT_AMBULATORY_CARE_PROVIDER_SITE_OTHER): Payer: BLUE CROSS/BLUE SHIELD | Admitting: Internal Medicine

## 2014-07-14 ENCOUNTER — Encounter: Payer: Self-pay | Admitting: *Deleted

## 2014-07-14 ENCOUNTER — Encounter: Payer: Self-pay | Admitting: Internal Medicine

## 2014-07-14 VITALS — BP 127/74 | HR 112 | Resp 16 | Ht 69.0 in | Wt 221.0 lb

## 2014-07-14 DIAGNOSIS — L03115 Cellulitis of right lower limb: Secondary | ICD-10-CM | POA: Diagnosis not present

## 2014-07-14 LAB — CBC WITH DIFFERENTIAL/PLATELET
BASOS PCT: 3 % — AB (ref 0–1)
Basophils Absolute: 0.1 10*3/uL (ref 0.0–0.1)
EOS ABS: 0.2 10*3/uL (ref 0.0–0.7)
Eosinophils Relative: 4 % (ref 0–5)
HCT: 42.7 % (ref 36.0–46.0)
HEMOGLOBIN: 14.3 g/dL (ref 12.0–15.0)
LYMPHS ABS: 2.4 10*3/uL (ref 0.7–4.0)
Lymphocytes Relative: 51 % — ABNORMAL HIGH (ref 12–46)
MCH: 29.7 pg (ref 26.0–34.0)
MCHC: 33.5 g/dL (ref 30.0–36.0)
MCV: 88.8 fL (ref 78.0–100.0)
MPV: 10 fL (ref 8.6–12.4)
Monocytes Absolute: 0.4 10*3/uL (ref 0.1–1.0)
Monocytes Relative: 9 % (ref 3–12)
Neutro Abs: 1.6 10*3/uL — ABNORMAL LOW (ref 1.7–7.7)
Neutrophils Relative %: 33 % — ABNORMAL LOW (ref 43–77)
Platelets: 329 10*3/uL (ref 150–400)
RBC: 4.81 MIL/uL (ref 3.87–5.11)
RDW: 13 % (ref 11.5–15.5)
WBC: 4.8 10*3/uL (ref 4.0–10.5)

## 2014-07-14 LAB — COMPREHENSIVE METABOLIC PANEL
ALK PHOS: 94 U/L (ref 39–117)
ALT: 19 U/L (ref 0–35)
AST: 17 U/L (ref 0–37)
Albumin: 4.2 g/dL (ref 3.5–5.2)
BUN: 9 mg/dL (ref 6–23)
CO2: 24 meq/L (ref 19–32)
Calcium: 9.8 mg/dL (ref 8.4–10.5)
Chloride: 102 mEq/L (ref 96–112)
Creat: 0.75 mg/dL (ref 0.50–1.10)
Glucose, Bld: 66 mg/dL — ABNORMAL LOW (ref 70–99)
Potassium: 3.9 mEq/L (ref 3.5–5.3)
Sodium: 138 mEq/L (ref 135–145)
Total Bilirubin: 1 mg/dL (ref 0.2–1.2)
Total Protein: 6.9 g/dL (ref 6.0–8.3)

## 2014-07-14 MED ORDER — CEFTRIAXONE SODIUM 1 G IJ SOLR
1.0000 g | Freq: Once | INTRAMUSCULAR | Status: AC
  Administered 2014-07-14: 1 g via INTRAMUSCULAR

## 2014-07-14 NOTE — Progress Notes (Signed)
Subjective:    Patient ID: Stephanie Crawford, female    DOB: [birth year], 69 y.o.   MRN: [id number]  HPI 07/04/2014 ED note MDM   1. 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    Wound culture and fungal cultures pending. Begin empiric terbinafine 250mg  once daily for two weeks, and Keflex 500mg  BID for 10 days. Weeping areas on right foot dressed with iodoform gauze and overlying bandage. Leave today's bandage in place until tomorrow, then change bandage daily with non-stick dressing such as Telfa. Begin Domeboro soak 2 or 3 times daily until weeping ceases. Followup with dermatologist if not improved two weeks.    Kandra Nicolas, MD       [name] presents for UC  follow up.  Treated for cellulitis of Right foot See above.    See wound culture:    MRSA and enterococcus.   Pt was called on 2/18 by UC staff and antibiotic changed to Doxycycline which she has been taking bid since 2/18.  She is also taking tebifinadine and Domeboro's soaks  She was advised to follow up with her dematologist ( she has seen [name])  But she has not made an appt.  She states she called the dermatology office that she was referred to  By [facility] but they were not in her provide network  She tells me her foot had stopped weeping but rash has minimal improvement .     No fever but dark spots near her toes.    No Known Allergies Past Medical History  Diagnosis Date  . Allergy   . Anemia     prior to hysterectomy--1997  . GERD (gastroesophageal reflux disease)   . Heart murmur   . Hemorrhoid 2011  . History of shingles 04/2014   Past Surgical History  Procedure Laterality Date  . Appendectomy  1960  . Abdominal hysterectomy  1997  . Lipoma excision  1975    neck, left breast and righ jaw.  . Hemorrhoid surgery  9/11    8/27 had a follow up procedure.     History   Social  History  . Marital Status: Married    Spouse Name: N/A  . Number of Children: N/A  . Years of Education: N/A   Occupational History  . Not on file.   Social History Main Topics  . Smoking status: Never Smoker   . Smokeless tobacco: Never Used  . Alcohol Use: No  . Drug Use: Not on file  . Sexual Activity: Yes   Other Topics Concern  . Not on file   Social History Narrative   Family History  Problem Relation Age of Onset  . Cancer Father     prostate  . Asthma Daughter   . Cancer Maternal Grandmother     lung   Patient Active Problem List   Diagnosis Date Noted  . Osteoporosis 01/30/2013  . Vitamin D deficiency 01/13/2013  . Other and unspecified hyperlipidemia 01/06/2013  . Breast thickening 01/06/2013  . Skin lesion of right leg 09/19/2012  . Allergic rhinitis 09/19/2012  . Elevated bilirubin 07/12/2012  . History of cardiac murmur 07/12/2012  . Rash and nonspecific skin eruption 07/11/2012  . Rectal bleeding 05/11/2011  . Abnormal EKG 03/01/2011  . General medical examination 03/01/2011  . OSTEOPENIA 12/24/2009  . GERD 12/08/2009  . ANKLE EDEMA 12/08/2009   Current Outpatient Prescriptions  on File Prior to Visit  Medication Sig Dispense Refill  . Alum Sulfate-Ca Acetate (DOMEBORO) 25 % PACK Dissolve in water and apply as a compress TID 12 each 1  . calcium carbonate 1250 MG capsule Take 1,250 mg by mouth 2 (two) times daily with a meal.    . cephALEXin (KEFLEX) 500 MG capsule Take 1 capsule (500 mg total) by mouth 2 (two) times daily. 20 capsule 0  . cholecalciferol (VITAMIN D) 1000 UNITS tablet Take 2,000 Units by mouth daily.    . Hydrocodone-Acetaminophen (VICODIN ES) 7.5-300 MG TABS Take one every 6 hours as needed for pain 30 each 0  . pantoprazole (PROTONIX) 20 MG tablet Take 1 tablet (20 mg total) by mouth daily. 90 tablet 1  . terbinafine (LAMISIL) 250 MG tablet Take 1 tablet (250 mg total) by mouth daily. 14 tablet 0   No current  facility-administered medications on file prior to visit.       Review of Systems See HPI    Objective:   Physical Exam Physical Exam  Nursing note and vitals reviewed.  Constitutional: She is oriented to person, place, and time. She appears well-developed and well-nourished.  HENT:  Head: Normocephalic and atraumatic.  Cardiovascular: Normal rate and regular rhythm. Exam reveals no gallop and no friction rub.  No murmur heard.  Pulmonary/Chest: Breath sounds normal. She has no wheezes. She has no rales.  Neurological: She is alert and oriented to person, place, and time.  Skin: Skin is warm and dry.  R foot  She has scaly hyperpigmented area medial plantar surgace.  Similar appearance to toes.  Minimal edema.  Minimal erythema of toes  NO weeping areas seen today  Psychiatric: She has a normal mood and affect. Her behavior is normal.        Assessment & Plan:  MRSA and enterococus infection of  Right foot.  :   Appears to be minimal improvement.  Will give Rocephin 1 gm in office today , check CBC and blood culture.   Ok to stop Domeboro's soaks and area is dry now.     Will await WBC   If no improvement in next 24-48 hours may need hospitalization for IV antibiotics.    Pt voices understanding

## 2014-07-15 ENCOUNTER — Inpatient Hospital Stay (HOSPITAL_COMMUNITY)
Admission: AD | Admit: 2014-07-15 | Discharge: 2014-07-17 | DRG: 593 | Disposition: A | Discharge status: Home / Self Care | Payer: BLUE CROSS/BLUE SHIELD | Source: Ambulatory Visit | Attending: Internal Medicine | Admitting: Internal Medicine

## 2014-07-15 ENCOUNTER — Telehealth: Payer: Self-pay | Admitting: Internal Medicine

## 2014-07-15 ENCOUNTER — Encounter (HOSPITAL_COMMUNITY): Payer: Self-pay

## 2014-07-15 DIAGNOSIS — B9562 Methicillin resistant Staphylococcus aureus infection as the cause of diseases classified elsewhere: Secondary | ICD-10-CM | POA: Diagnosis present

## 2014-07-15 DIAGNOSIS — L03115 Cellulitis of right lower limb: Secondary | ICD-10-CM | POA: Diagnosis present

## 2014-07-15 DIAGNOSIS — K219 Gastro-esophageal reflux disease without esophagitis: Secondary | ICD-10-CM | POA: Diagnosis present

## 2014-07-15 DIAGNOSIS — R21 Rash and other nonspecific skin eruption: Secondary | ICD-10-CM | POA: Diagnosis present

## 2014-07-15 DIAGNOSIS — B952 Enterococcus as the cause of diseases classified elsewhere: Secondary | ICD-10-CM | POA: Diagnosis present

## 2014-07-15 DIAGNOSIS — L0889 Other specified local infections of the skin and subcutaneous tissue: Secondary | ICD-10-CM | POA: Diagnosis present

## 2014-07-15 DIAGNOSIS — L97511 Non-pressure chronic ulcer of other part of right foot limited to breakdown of skin: Secondary | ICD-10-CM | POA: Diagnosis not present

## 2014-07-15 DIAGNOSIS — L97509 Non-pressure chronic ulcer of other part of unspecified foot with unspecified severity: Secondary | ICD-10-CM | POA: Diagnosis present

## 2014-07-15 DIAGNOSIS — L97519 Non-pressure chronic ulcer of other part of right foot with unspecified severity: Secondary | ICD-10-CM | POA: Diagnosis present

## 2014-07-15 DIAGNOSIS — Z79899 Other long term (current) drug therapy: Secondary | ICD-10-CM

## 2014-07-15 LAB — CBC
HCT: 42.3 % (ref 36.0–46.0)
Hemoglobin: 14 g/dL (ref 12.0–15.0)
MCH: 30.2 pg (ref 26.0–34.0)
MCHC: 33.1 g/dL (ref 30.0–36.0)
MCV: 91.4 fL (ref 78.0–100.0)
Platelets: 312 10*3/uL (ref 150–400)
RBC: 4.63 MIL/uL (ref 3.87–5.11)
RDW: 12.7 % (ref 11.5–15.5)
WBC: 5.6 10*3/uL (ref 4.0–10.5)

## 2014-07-15 MED ORDER — VANCOMYCIN HCL IN DEXTROSE 750-5 MG/150ML-% IV SOLN
750.0000 mg | Freq: Two times a day (BID) | INTRAVENOUS | Status: DC
  Administered 2014-07-16 – 2014-07-17 (×3): 750 mg via INTRAVENOUS
  Filled 2014-07-15 (×3): qty 150

## 2014-07-15 MED ORDER — VANCOMYCIN HCL 10 G IV SOLR
2000.0000 mg | Freq: Once | INTRAVENOUS | Status: AC
  Administered 2014-07-15: 2000 mg via INTRAVENOUS
  Filled 2014-07-15: qty 2000

## 2014-07-15 MED ORDER — CALCIUM CARBONATE 1250 (500 CA) MG PO TABS
1250.0000 mg | ORAL_TABLET | Freq: Two times a day (BID) | ORAL | Status: DC
  Filled 2014-07-15 (×2): qty 1

## 2014-07-15 MED ORDER — PANTOPRAZOLE SODIUM 20 MG PO TBEC
20.0000 mg | DELAYED_RELEASE_TABLET | Freq: Every day | ORAL | Status: DC
  Administered 2014-07-15 – 2014-07-17 (×3): 20 mg via ORAL
  Filled 2014-07-15 (×3): qty 1

## 2014-07-15 MED ORDER — ENOXAPARIN SODIUM 40 MG/0.4ML ~~LOC~~ SOLN
40.0000 mg | SUBCUTANEOUS | Status: DC
Start: 2014-07-15 — End: 2014-07-17
  Administered 2014-07-15 – 2014-07-16 (×2): 40 mg via SUBCUTANEOUS
  Filled 2014-07-15 (×3): qty 0.4

## 2014-07-15 MED ORDER — CALCIUM CARBONATE 1250 (500 CA) MG PO TABS
1.0000 | ORAL_TABLET | Freq: Two times a day (BID) | ORAL | Status: DC
  Administered 2014-07-16 – 2014-07-17 (×3): 500 mg via ORAL
  Filled 2014-07-15 (×7): qty 1

## 2014-07-15 MED ORDER — VITAMIN D3 25 MCG (1000 UNIT) PO TABS
2000.0000 [IU] | ORAL_TABLET | Freq: Every day | ORAL | Status: DC
  Administered 2014-07-15 – 2014-07-17 (×3): 2000 [IU] via ORAL
  Filled 2014-07-15 (×3): qty 2

## 2014-07-15 MED ORDER — TERBINAFINE HCL 250 MG PO TABS
250.0000 mg | ORAL_TABLET | Freq: Every day | ORAL | Status: DC
  Administered 2014-07-15 – 2014-07-17 (×3): 250 mg via ORAL
  Filled 2014-07-15 (×3): qty 1

## 2014-07-15 NOTE — Progress Notes (Signed)
ANTIBIOTIC CONSULT NOTE - INITIAL  Pharmacy Consult for Vancomycin Indication: Cellulitis  No Known Allergies  Patient Measurements: Height: 5\' 9"  (175.3 cm) IBW/kg (Calculated) : 66.2 Total body weight: 100.2 kg  Vital Signs: Temp: 98.1 F (36.7 C) (02/24 1310) Temp Source: Oral (02/24 1310) BP: 140/76 mmHg (02/24 1310) Pulse Rate: 100 (02/24 1310) Intake/Output from previous day:   Intake/Output from this shift:    Labs:  Recent Labs  07/14/14 1231  WBC 4.8  HGB 14.3  PLT 329  CREATININE 0.75   Estimated Creatinine Clearance: 84.8 mL/min (by C-G formula based on Cr of 0.75). No results for input(s): VANCOTROUGH, VANCOPEAK, VANCORANDOM, GENTTROUGH, GENTPEAK, GENTRANDOM, TOBRATROUGH, TOBRAPEAK, TOBRARND, AMIKACINPEAK, AMIKACINTROU, AMIKACIN in the last 72 hours.   Microbiology: Recent Results (from the past 720 hour(s))  Wound culture     Status: None   Collection Time: 07/04/14  4:22 PM  Result Value Ref Range Status   Culture   Final    Abundant METHICILLIN RESISTANT STAPHYLOCOCCUS AUREUS Few ENTEROCOCCUS SPECIES    Gram Stain No WBC Seen  Final   Gram Stain No Squamous Epithelial Cells Seen  Final   Gram Stain Few GRAM POSITIVE COCCI IN PAIRS In Clusters  Final   Organism ID, Bacteria METHICILLIN RESISTANT STAPHYLOCOCCUS AUREUS  Final    Comment: Rifampin and Gentamicin should not be used as single drugs for treatment of Staph infections.    Organism ID, Bacteria ENTEROCOCCUS SPECIES  Final      Susceptibility   Enterococcus species -  (no method available)    AMPICILLIN <=2 Sensitive     VANCOMYCIN 1 Sensitive    Methicillin resistant staphylococcus aureus -  (no method available)    PENICILLIN 0.25 Resistant     OXACILLIN >=4 Resistant     CEFAZOLIN  Resistant     GENTAMICIN <=0.5 Sensitive     CIPROFLOXACIN <=0.5 Sensitive     LEVOFLOXACIN 0.25 Sensitive     TRIMETH/SULFA <=10 Sensitive     VANCOMYCIN 1 Sensitive     CLINDAMYCIN 1 Intermediate      ERYTHROMYCIN >=8 Resistant     LINEZOLID 2 Sensitive     RIFAMPIN 1 Sensitive     TETRACYCLINE 4 Sensitive   Culture, fungus without smear     Status: None (Preliminary result)   Collection Time: 07/04/14  5:52 PM  Result Value Ref Range Status   Preliminary Report Culture in Progress for 4 Weeks  Preliminary  Culture, blood (single)     Status: None (Preliminary result)   Collection Time: 07/14/14 12:29 PM  Result Value Ref Range Status   Preliminary Report Blood Culture received; No Growth to date;  Preliminary   Preliminary Report Culture will be held for 5 days before issuing  Preliminary   Preliminary Report a Final Negative report.  Preliminary    Medical History: Past Medical History  Diagnosis Date  . Allergy   . Anemia     prior to hysterectomy--1997  . GERD (gastroesophageal reflux disease)   . Heart murmur   . Hemorrhoid 2011  . History of shingles 04/2014    Medications:  Scheduled:  . calcium carbonate  1,250 mg Oral BID WC  . cholecalciferol  2,000 Units Oral Daily  . enoxaparin (LOVENOX) injection  40 mg Subcutaneous Q24H  . [START ON 07/16/2014] pantoprazole  20 mg Oral Daily  . terbinafine  250 mg Oral Daily   Infusions:   PRN:   Assessment: 69 yo female admitted for IV  antibiotics for treatment of cellulitis of right foot. She was started on PO kefflex and terbinafine 2/13, then changed to PO doxycycline on 2/18 when wound culture results returned MRSA and enterococcus. Was given 1g ceftriaxone at [facility] in [place].  2/24 >> Vancomycin >>  Tmax: Afebrile WBC: 4.8k Renal: SCr 0.75, CrCl 85 ml/min CG, 81 ml/min Normalized  2/13 R foot: MRSA and Enterococcus (both sensitive to vanc)   Goal of Therapy:  Vancomycin trough level 10-15 mcg/ml  Plan:   Vancomycin 2g IV x 1, then 750mg  IV q12h Check trough at steady state Follow up renal function & cultures  Peggyann Juba, PharmD, BCPS Pager: [phone] 07/15/2014,2:17 PM

## 2014-07-15 NOTE — Telephone Encounter (Signed)
Spoke with pt .  With worsening cellulitis, clinical signs of non-healing,  and new lesions on her foot I feel she will need  Admission for IV treatment  .  Spoke with pt and informed of labs.    WBC normal,  Mild neutropenia  Blood culture pending   I spoke with Dr. Ree Kida and will admit pt to [facility] .

## 2014-07-15 NOTE — Consult Note (Signed)
WOC wound consult note Reason for Consult: Partial thickness rash-like area with serous crust covering Wound type:Etiology is not known.  Patient has history of shingles, but this is dissimilar to that presentation. Pressure Ulcer POA: No Measurement: 9cm x 4cm with no depth,  Raised areas with dried serum covering.  Same presentation between right great toe and second digit in the interdigital space, also between the 4th and 5th digits of the right foot. Wound bed:As described above. Drainage (amount, consistency, odor) None Periwound: Slightly darker than surrounding tissues. Dressing procedure/placement/frequency: I have implemented a conservative POC.  This skin condition supercedes the scope of [place].  If it does not improve or if improvement is not consistent with patient's overall response, please consider consultation with Dermatology or ID. [place] nursing team will not follow, but will remain available to this patient, the nursing and medical team.  Please re-consult if needed. Thanks, Maudie Flakes, MSN, RN, [place], [place], [place] [phone])

## 2014-07-15 NOTE — Progress Notes (Signed)
Utilization review completed.  

## 2014-07-15 NOTE — Progress Notes (Signed)
Two hours after high dose vancomycin infusion started, patient complained of itching in her head and across the shoulders,no redness or rashes noted anywhere and no other complaints presented. Infusion rate slowed down more to 50 ml/hr, and Dr Cruzita Lederer notified.Patient claimed itching subsided after a while.

## 2014-07-15 NOTE — H&P (Signed)
History and Physical    LEGEND PECORE EVO:[id number] DOB: 25-Feb-1946 DOA: 07/15/2014  Referring physician: Dr. Rudene Anda PCP: Kelton Pillar, MD  Specialists: none  Chief Complaint: right foot ulcer/cellulitis  HPI: Stephanie Crawford is a 69 y.o. female relatively healthy who has been having problems with the right foot ulcer, appeared to 2-3 weeks ago, progressively getting bigger, she was initially placed on Keflex and it progressed through that. She had a visit to an urgent care on 2/13, and it was noted to be a discharge, and that was cultured, and she dropped enterococcus as well as MRSA. Once cultures were back, her Keflex was discontinued and patient was placed on doxycycline which she has been taking for the past 4 days. Despite that, patient feels that her wound has progressed, she discussed with her primary M.D. and was referred for admission for IV antibiotics. She denies any systemic symptoms such as fever or chills, she denies any chest pain, shortness of breath, GI or GU complaints. She is otherwise feeling at baseline.  Review of Systems: As per history of present illness, otherwise 10 point review of systems negative  Past Medical History  Diagnosis Date  . Allergy   . Anemia     prior to hysterectomy--1997  . GERD (gastroesophageal reflux disease)   . Heart murmur   . Hemorrhoid 2011  . History of shingles 04/2014   Past Surgical History  Procedure Laterality Date  . Appendectomy  1960  . Abdominal hysterectomy  1997  . Lipoma excision  1975    neck, left breast and righ jaw.  . Hemorrhoid surgery  9/11    8/27 had a follow up procedure.     Social History:  reports that she has never smoked. She has never used smokeless tobacco. She reports that she does not drink alcohol. Her drug history is not on file.  No Known Allergies  Family History  Problem Relation Age of Onset  . Cancer Father     prostate  . Asthma Daughter   . Cancer Maternal  Grandmother     lung    Prior to Admission medications   Medication Sig Start Date End Date Taking? Authorizing Provider  Alum Sulfate-Ca Acetate (DOMEBORO) 25 % PACK Dissolve in water and apply as a compress TID 07/04/14   Kandra Nicolas, MD  calcium carbonate 1250 MG capsule Take 1,250 mg by mouth 2 (two) times daily with a meal.    Historical Provider, MD  cholecalciferol (VITAMIN D) 1000 UNITS tablet Take 2,000 Units by mouth daily.    Historical Provider, MD  doxycycline (VIBRA-TABS) 100 MG tablet Take 100 mg by mouth 2 (two) times daily. 07/09/14   Historical Provider, MD  pantoprazole (PROTONIX) 20 MG tablet Take 1 tablet (20 mg total) by mouth daily. 01/28/14   Lanice Shirts, MD  terbinafine (LAMISIL) 250 MG tablet Take 1 tablet (250 mg total) by mouth daily. 07/04/14   Kandra Nicolas, MD   Physical Exam: Filed Vitals:   07/15/14 1310  BP: 140/76  Pulse: 100  Temp: 98.1 F (36.7 C)  TempSrc: Oral  Resp: 18  Height: 5\' 9"  (1.753 m)  SpO2: 100%     General:  No apparent distress, pleasant  Eyes: no scleral icterus  Neck: supple  Cardiovascular: regular rate without MRG; 2+ peripheral pulses, no JVD, no peripheral edema  Respiratory: CTA biL, good air movement without wheezing  Abdomen: soft, non tender to palpation  Skin: 4-5 cm superficial  wound on medial aspect of foot with mild surrounding ertythema, non tender, without drainage noted; small raised almost vesicular rash above toes 3-5 on right. Skin darkening around base of toes 1-3  Musculoskeletal: normal bulk and tone, no joint swelling  Psychiatric: normal mood and affect  Neurologic: non focal  Labs on Admission:  Basic Metabolic Panel:  Recent Labs Lab 07/14/14 1231  NA 138  K 3.9  CL 102  CO2 24  GLUCOSE 66*  BUN 9  CREATININE 0.75  CALCIUM 9.8   Liver Function Tests:  Recent Labs Lab 07/14/14 1231  AST 17  ALT 19  ALKPHOS 94  BILITOT 1.0  PROT 6.9  ALBUMIN 4.2    CBC:  Recent Labs Lab 07/14/14 1231  WBC 4.8  NEUTROABS 1.6*  HGB 14.3  HCT 42.7  MCV 88.8  PLT 329   Assessment/Plan Active Problems:   Rash and nonspecific skin eruption   Foot ulcer   Foot ulcer with mild surrounding cellulitis - Based on microbiology, she grew up MRSA which is sensitive to doxycycline as well as levofloxacin. She also grew out enterococcus which is sensitive to ampicillin and vancomycin. - Start vancomycin for now which should have good coverage for both as well as other gram positives - Wound care consult - Check for diabetes mellitus with a hemoglobin A1c   Diet: regular Fluids: none DVT Prophylaxis: Lovenox  Code Status: Full, presumed Family Communication:  D/w patient Disposition Plan: inpatient  Time spent: 38  Costin M. Cruzita Lederer, MD [facility] Pager [phone]  If 7PM-7AM, please contact night-coverage [email or web address] Password [facility] 07/15/2014, 2:06 PM

## 2014-07-16 LAB — CBC
HCT: 41.9 % (ref 36.0–46.0)
HEMOGLOBIN: 13.9 g/dL (ref 12.0–15.0)
MCH: 30.1 pg (ref 26.0–34.0)
MCHC: 33.2 g/dL (ref 30.0–36.0)
MCV: 90.7 fL (ref 78.0–100.0)
Platelets: 288 10*3/uL (ref 150–400)
RBC: 4.62 MIL/uL (ref 3.87–5.11)
RDW: 12.6 % (ref 11.5–15.5)
WBC: 4.6 10*3/uL (ref 4.0–10.5)

## 2014-07-16 LAB — COMPREHENSIVE METABOLIC PANEL
ALT: 19 U/L (ref 0–35)
ANION GAP: 7 (ref 5–15)
AST: 22 U/L (ref 0–37)
Albumin: 3.8 g/dL (ref 3.5–5.2)
Alkaline Phosphatase: 88 U/L (ref 39–117)
BUN: 13 mg/dL (ref 6–23)
CALCIUM: 9 mg/dL (ref 8.4–10.5)
CO2: 27 mmol/L (ref 19–32)
Chloride: 101 mmol/L (ref 96–112)
Creatinine, Ser: 0.74 mg/dL (ref 0.50–1.10)
GFR calc non Af Amer: 85 mL/min — ABNORMAL LOW (ref >90)
GLUCOSE: 100 mg/dL — AB (ref 70–99)
Potassium: 3.6 mmol/L (ref 3.5–5.1)
Sodium: 135 mmol/L (ref 135–145)
Total Bilirubin: 1.3 mg/dL — ABNORMAL HIGH (ref 0.3–1.2)
Total Protein: 6.7 g/dL (ref 6.0–8.3)

## 2014-07-16 LAB — HEMOGLOBIN A1C
HEMOGLOBIN A1C: 5.6 % (ref 4.8–5.6)
Mean Plasma Glucose: 114 mg/dL

## 2014-07-16 NOTE — Progress Notes (Signed)
[facility] PROGRESS NOTE  Stephanie Crawford ZDG:[id number] DOB: June 24, 1945 DOA: 07/15/2014  PCP: Kelton Pillar, MD  Brief HPI: 69 year old African-American female presented with ulceration and rash in the right foot. Apparently grew MRSA and enterococcus as an outpatient. She had was initially placed on Keflex and then doxycycline. Wound had not improved any, so she was admitted to the hospital for further management.  Past medical history:  Past Medical History  Diagnosis Date  . Allergy   . Anemia     prior to hysterectomy--1997  . GERD (gastroesophageal reflux disease)   . Heart murmur   . Hemorrhoid 2011  . History of shingles 04/2014    Consultants: None  Procedures: None  Antibiotics: Intravenous vancomycin. 2/24  Subjective: Patient feels slightly better in terms of the wound on her foot. She is frustrated by the fact that this has not improved any in the last couple of weeks. Denies any fever, chills. No nausea, vomiting. Has been able to ambulate. Husband is at bedside.  Objective: Vital Signs  Filed Vitals:   07/15/14 1310 07/15/14 1330 07/15/14 2232 07/16/14 0530  BP: 140/76  133/75 132/81  Pulse: 100  81 77  Temp: 98.1 F (36.7 C)  98.1 F (36.7 C) 97.7 F (36.5 C)  TempSrc: Oral  Oral Oral  Resp: 18  18 18   Height: 5\' 9"  (1.753 m)     Weight:  100.245 kg (221 lb)    SpO2: 100%  100% 100%   No intake or output data in the 24 hours ending 07/16/14 1240 Filed Weights   07/15/14 1330  Weight: 100.245 kg (221 lb)    General appearance: alert, cooperative, appears stated age and no distress Resp: clear to auscultation bilaterally Cardio: regular rate and rhythm, S1, S2 normal, no murmur, click, rub or gallop GI: soft, non-tender; bowel sounds normal; no masses,  no organomegaly Extremities: She's got the vesicular rash in the anterior aspect of her right foot. 2. Once the toes. She has dry scaly ulceration in the plantar aspect. No  active drainage is noted. Minimal erythema. Skin: Scaly lesion in the plantar aspect of right foot.  Lab Results:  Basic Metabolic Panel:  Recent Labs Lab 07/14/14 1231 07/16/14 0456  NA 138 135  K 3.9 3.6  CL 102 101  CO2 24 27  GLUCOSE 66* 100*  BUN 9 13  CREATININE 0.75 0.74  CALCIUM 9.8 9.0   Liver Function Tests:  Recent Labs Lab 07/14/14 1231 07/16/14 0456  AST 17 22  ALT 19 19  ALKPHOS 94 88  BILITOT 1.0 1.3*  PROT 6.9 6.7  ALBUMIN 4.2 3.8   CBC:  Recent Labs Lab 07/14/14 1231 07/15/14 1500 07/16/14 0456  WBC 4.8 5.6 4.6  NEUTROABS 1.6*  --   --   HGB 14.3 14.0 13.9  HCT 42.7 42.3 41.9  MCV 88.8 91.4 90.7  PLT 329 312 288     Recent Results (from the past 240 hour(s))  Culture, blood (single)     Status: None (Preliminary result)   Collection Time: 07/14/14 12:29 PM  Result Value Ref Range Status   Preliminary Report Blood Culture received; No Growth to date;  Preliminary   Preliminary Report Culture will be held for 5 days before issuing  Preliminary   Preliminary Report a Final Negative report.  Preliminary      Studies/Results: No results found.  Medications:  Scheduled: . calcium carbonate  1 tablet Oral BID WC  . cholecalciferol  2,000  Units Oral Daily  . enoxaparin (LOVENOX) injection  40 mg Subcutaneous Q24H  . pantoprazole  20 mg Oral Daily  . terbinafine  250 mg Oral Daily  . vancomycin  750 mg Intravenous Q12H   Continuous:  PRN:  Assessment/Plan:  Active Problems:   Rash and nonspecific skin eruption   Foot ulcer    Foot ulcer with mild surrounding cellulitis She failed outpatient treatment with Keflex and doxycycline. She is currently on intravenous vancomycin. Continue this for now. Have explained to the patient and her husband that it will likely take the ulcerations and the rash many weeks to improve. Even then, we do not have a clear diagnosis. I have told her that she will really benefit from seeing a  dermatologist to get a definitive diagnosis. Anticipate we may be able to de-escalate antibiotics tomorrow. HbA1c is 5.6.   DVT Prophylaxis: Lovenox    Code Status: Full code  Family Communication: Discussed with the patient and her husband  Disposition Plan: Will return home when improved.    LOS: 1 day   [facility] Pager [phone] 07/16/2014, 12:40 PM  If 7PM-7AM, please contact night-coverage at [email or web address], password [facility]

## 2014-07-17 MED ORDER — LEVOFLOXACIN 500 MG PO TABS: 500.0000 mg | ORAL_TABLET | Freq: Every day | ORAL | Status: DC

## 2014-07-17 MED ORDER — TERBINAFINE HCL 250 MG PO TABS: 250.0000 mg | ORAL_TABLET | Freq: Every day | ORAL | Status: DC

## 2014-07-17 MED ORDER — LEVOFLOXACIN 500 MG PO TABS
500.0000 mg | ORAL_TABLET | Freq: Every day | ORAL | Status: DC
  Administered 2014-07-17: 500 mg via ORAL
  Filled 2014-07-17: qty 1

## 2014-07-17 MED ORDER — DIPHENHYDRAMINE-ZINC ACETATE 1-0.1 % EX CREA: TOPICAL_CREAM | Freq: Three times a day (TID) | CUTANEOUS | Status: DC | PRN

## 2014-07-17 NOTE — Discharge Instructions (Signed)
Community-Associated MRSA CA-MRSA stands for community-associated methicillin-resistant Staphylococcus aureus. MRSA is a type of bacteria that is resistant to some common antibiotics. It can cause infections in the skin and many other places in the body. Staphylococcus aureus, often called "staph," is a bacteria that normally lives on the skin or in the nose. Staph on the surface of the skin or in the nose does not cause problems. However, if the staph enters the body through a cut, wound, or break in the skin, an infection can happen. Up until recently, infections with the MRSA type of staph mainly occurred in hospitals and other health care settings. There are now increasing problems with MRSA infections in the community as well. Infections with MRSA may be very serious or even life threatening. CA-MRSA is becoming more common. It is known to spread in crowded settings, in jails and prisons, and in situations where there is close skin-to-skin contact, such as during sporting events or in locker rooms. MRSA can be spread through shared items, such as children's toys, razors, towels, or sports equipment.  CAUSES All staph, including MRSA, are normally harmless unless they enter the body through a scratch, cut, or wound, such as with surgery. All staph, including MRSA, can be spread from person-to-person by touching contaminated objects or through direct contact.  MRSA now causes illness in people who have not been in hospitals or other health care facilities. Cases of MRSA diseases in the community have been associated with:  Recent antibiotic use.  Sharing contaminated towels or clothes.  Having active skin diseases.  Participating in contact sports.  Living in crowded settings.  Intravenous (IV) drug use.  Community-associated MRSA infections are usually skin infections, but may cause other severe illnesses.  Staph bacteria are one of the most common causes of skin infection. However, they  are also a common cause of pneumonia, bone or joint infections, and bloodstream infections. DIAGNOSIS Diagnosis of MRSA is done by cultures of fluid samples that may come from:  Swabs taken from cuts or wounds in infected areas.  Nasal swabs.  Saliva or deep cough specimens from the lungs (sputum).  Urine.  Blood. Many people are "colonized" with MRSA but have no signs of infection. This means that people carry the MRSA germ on their skin or in their nose and may never develop MRSA infection.  TREATMENT  Treatment varies and is based on how serious, how deep, or how extensive the infection is. For example:  Some skin infections, such as a small boil or abscess, may be treated by draining yellowish-white fluid (pus) from the site of the infection.  Deeper or more widespread soft tissue infections are usually treated with surgery to drain pus and with antibiotic medicine given by vein or by mouth. This may be recommended even if you are pregnant.  Serious infections may require a hospital stay. If antibiotics are given, they may be needed for several weeks. PREVENTION Because many people are colonized with staph, including MRSA, preventing the spread of the bacteria from person-to-person is most important. The best way to prevent the spread of bacteria and other germs is through proper hand washing or by using alcohol-based hand disinfectants. The following are other ways to help prevent MRSA infection within community settings.   Wash your hands frequently with soap and water for at least 15 seconds. Otherwise, use alcohol-based hand disinfectants when soap and water is not available.  Make sure people who live with you wash their hands often, too.  Do not share personal items. For example, avoid sharing razors and other personal hygiene items, towels, clothing, and athletic equipment.  Wash and dry your clothes and bedding at the warmest temperatures recommended on the labels.  Keep  wounds covered. Pus from infected sores may contain MRSA and other bacteria. Keep cuts and abrasions clean and covered with germ-free (sterile), dry bandages until they are healed.  If you have a wound that appears infected, ask your caregiver if a culture for MRSA and other bacteria should be done.  If you are breastfeeding, talk to your caregiver about MRSA. You may be asked to temporarily stop breastfeeding. HOME CARE INSTRUCTIONS   Take your antibiotics as directed. Finish them even if you start to feel better.  Avoid close contact with those around you as much as possible. Do not use towels, razors, toothbrushes, bedding, or other items that will be used by others.  To fight the infection, follow your caregiver's instructions for wound care. Wash your hands before and after changing your bandages.  If you have an intravascular device, such as a catheter, make sure you know how to care for it.  Be sure to tell any health care providers that you have MRSA so they are aware of your infection. SEEK IMMEDIATE MEDICAL CARE IF:  The infection appears to be getting worse. Signs include:  Increased warmth, redness, or tenderness around the wound site.  A red line that extends from the infection site.  A dark color in the area around the infection.  Wound drainage that is tan, yellow, or green.  A bad smell coming from the wound.  You feel sick to your stomach (nauseous) and throw up (vomit) or cannot keep medicine down.  You have a fever.  Your baby is older than 3 months with a rectal temperature of 102F (38.9C) or higher.  Your baby is 64 months old or younger with a rectal temperature of 100.82F (38C) or higher.  You have difficulty breathing. MAKE SURE YOU:   Understand these instructions.  Will watch your condition.  Will get help right away if you are not doing well or get worse. Document Released: 08/11/2005 Document Revised: 09/22/2013 Document Reviewed:  08/11/2010 [facility] Patient Information 2015 [place], [place]. This information is not intended to replace advice given to you by your health care provider. Make sure you discuss any questions you have with your health care provider.

## 2014-07-17 NOTE — Discharge Summary (Signed)
[facility]  Physician Discharge Summary   Patient ID: Stephanie Crawford MRN: [id number] DOB/AGE: 68-Mar-1947 68 y.o.  Admit date: 07/15/2014 Discharge date: 07/17/2014  PCP: Kelton Pillar, MD  DISCHARGE DIAGNOSES:  Active Problems:   Rash and nonspecific skin eruption   Foot ulcer   RECOMMENDATIONS FOR OUTPATIENT FOLLOW UP: 1. Patient asked to see a dermatologist. They have made an appointment with [name] on Tuesday.   DISCHARGE CONDITION: fair  Diet recommendation: Regular  Filed Weights   07/15/14 1330  Weight: 100.245 kg (221 lb)    INITIAL HISTORY: 69 year old African-American female presented with ulceration and rash in the right foot. Apparently grew MRSA and enterococcus as an outpatient. She had was initially placed on Keflex and then doxycycline. Wound had not improved any, so she was admitted to the hospital for further management.  Consultations:  None  Procedures:  None  HOSPITAL COURSE:   Right Foot ulcer with mild surrounding cellulitis Apparently, culture from this area grew MRSA. She was initially placed on Keflex with no improvement. She was then started on doxycycline. But after 4 days there was no improvement, so she was sent over to the hospital for IV antibiotics. Erythema quickly resolved. However, scaly scabby lesions remained. She also had some vesicular lesions in the anterior aspect of the foot. She was given intravenous vancomycin. Have explained to the patient and her husband that we do not have a clear diagnosis. I have explained to them that she will benefit from being seen by a dermatologist. They have made an appointment with [name] on Tuesday. In the meantime, her superimposed cellulitis as improved and she will be discharged on Levaquin. And she should continue and complete the course of the doxycycline that she has at home. Patient and husband understand.  She was discharged in stable  condition.    PERTINENT LABS:  The results of significant diagnostics from this hospitalization (including imaging, microbiology, ancillary and laboratory) are listed below for reference.    Microbiology: Recent Results (from the past 240 hour(s))  Culture, blood (single)     Status: None (Preliminary result)   Collection Time: 07/14/14 12:29 PM  Result Value Ref Range Status   Preliminary Report Blood Culture received; No Growth to date;  Preliminary   Preliminary Report Culture will be held for 5 days before issuing  Preliminary   Preliminary Report a Final Negative report.  Preliminary     Labs: Basic Metabolic Panel:  Recent Labs Lab 07/14/14 1231 07/16/14 0456  NA 138 135  K 3.9 3.6  CL 102 101  CO2 24 27  GLUCOSE 66* 100*  BUN 9 13  CREATININE 0.75 0.74  CALCIUM 9.8 9.0   Liver Function Tests:  Recent Labs Lab 07/14/14 1231 07/16/14 0456  AST 17 22  ALT 19 19  ALKPHOS 94 88  BILITOT 1.0 1.3*  PROT 6.9 6.7  ALBUMIN 4.2 3.8   CBC:  Recent Labs Lab 07/14/14 1231 07/15/14 1500 07/16/14 0456  WBC 4.8 5.6 4.6  NEUTROABS 1.6*  --   --   HGB 14.3 14.0 13.9  HCT 42.7 42.3 41.9  MCV 88.8 91.4 90.7  PLT 329 312 288    DISCHARGE EXAMINATION: Filed Vitals:   07/16/14 0530 07/16/14 1700 07/16/14 2158 07/17/14 0528  BP: 132/81 121/65 127/76 113/75  Pulse: 77 79 91 75  Temp: 97.7 F (36.5 C) 98.7 F (37.1 C) 98.4 F (36.9 C) 97.5 F (36.4 C)  TempSrc: Oral Oral Oral Oral  Resp:  18 18 18 16   Height:      Weight:      SpO2: 100% 97% 94% 95%   General appearance: alert, cooperative, appears stated age and no distress Resp: clear to auscultation bilaterally Cardio: regular rate and rhythm, S1, S2 normal, no murmur, click, rub or gallop GI: soft, non-tender; bowel sounds normal; no masses,  no organomegaly Extremities: Right foot reveals scaly scabby lesions in the plantar aspect. Dry area in the anterior aspect. No drainage or weeping  noted.  DISPOSITION: Home  Discharge Instructions    Call MD for:  redness, tenderness, or signs of infection (pain, swelling, redness, odor or green/yellow discharge around incision site)    Complete by:  As directed      Call MD for:  severe uncontrolled pain    Complete by:  As directed      Call MD for:  temperature >100.4    Complete by:  As directed      Diet general    Complete by:  As directed      Discharge instructions    Complete by:  As directed   Please follow up with a dermatologist. Complete the course of Doxycyline as you were taking at home. Keep foot dry and clean.     Increase activity slowly    Complete by:  As directed            ALLERGIES: No Known Allergies   Discharge Medication List as of 07/17/2014 12:44 PM    START taking these medications   Details  diphenhydrAMINE-zinc acetate (BENADRYL) cream Apply topically 3 (three) times daily as needed for itching., Starting 07/17/2014, Until Discontinued, Print    levofloxacin (LEVAQUIN) 500 MG tablet Take 1 tablet (500 mg total) by mouth daily. For 10 days starting 07/18/14, Starting 07/17/2014, Until Discontinued, Print      CONTINUE these medications which have CHANGED   Details  terbinafine (LAMISIL) 250 MG tablet Take 1 tablet (250 mg total) by mouth daily., Starting 07/17/2014, Until Discontinued, Print      CONTINUE these medications which have NOT CHANGED   Details  calcium carbonate (OS-CAL) 600 MG TABS tablet Take 1,200 mg by mouth daily., Until Discontinued, Historical Med    cholecalciferol (VITAMIN D) 1000 UNITS tablet Take 2,000 Units by mouth daily., Until Discontinued, Historical Med    doxycycline (VIBRA-TABS) 100 MG tablet Take 100 mg by mouth 2 (two) times daily., Starting 07/09/2014, Until Discontinued, Historical Med    pantoprazole (PROTONIX) 20 MG tablet Take 1 tablet (20 mg total) by mouth daily., Starting 01/28/2014, Until Discontinued, Normal      STOP taking these medications      Alum Sulfate-Ca Acetate (DOMEBORO) 25 % PACK      calcium carbonate 1250 MG capsule        Follow-up Information    Follow up with SCHOENHOFF,DEBBIE, MD. Schedule an appointment as soon as possible for a visit in 1 week.   Specialty:  Internal Medicine   Why:  post hospitalization follow up   Contact information:   [address] [place] [id number] [phone]       Follow up with Devra Dopp, MD.   Specialty:  Dermatology   Why:  as scheduled   Contact information:   [place], [address] [place] [address] [phone]       TOTAL DISCHARGE TIME: 35 mins  [facility] Pager [phone]  07/17/2014, 1:19 PM

## 2014-07-17 NOTE — Progress Notes (Signed)
Pt discharged home with significant other in stable condition. Discharge instructions and scripts given. Pt verbalized understanding.

## 2014-07-20 LAB — CULTURE, BLOOD (SINGLE): ORGANISM ID, BACTERIA: NO GROWTH

## 2014-07-21 DIAGNOSIS — L97511 Non-pressure chronic ulcer of other part of right foot limited to breakdown of skin: Secondary | ICD-10-CM | POA: Diagnosis not present

## 2014-07-21 DIAGNOSIS — B353 Tinea pedis: Secondary | ICD-10-CM | POA: Diagnosis not present

## 2014-07-21 DIAGNOSIS — A4902 Methicillin resistant Staphylococcus aureus infection, unspecified site: Secondary | ICD-10-CM | POA: Diagnosis not present

## 2014-07-22 ENCOUNTER — Encounter: Payer: Self-pay | Admitting: Internal Medicine

## 2014-07-22 ENCOUNTER — Ambulatory Visit (INDEPENDENT_AMBULATORY_CARE_PROVIDER_SITE_OTHER): Payer: BLUE CROSS/BLUE SHIELD | Admitting: Internal Medicine

## 2014-07-22 VITALS — BP 132/78 | HR 85 | Resp 16 | Ht 69.0 in | Wt 221.0 lb

## 2014-07-22 DIAGNOSIS — L97511 Non-pressure chronic ulcer of other part of right foot limited to breakdown of skin: Secondary | ICD-10-CM

## 2014-07-22 DIAGNOSIS — A4902 Methicillin resistant Staphylococcus aureus infection, unspecified site: Secondary | ICD-10-CM | POA: Diagnosis not present

## 2014-07-22 NOTE — Progress Notes (Signed)
   Subjective:    Patient ID: Stephanie Crawford, female    DOB: 12-26-1945, 69 y.o.   MRN: [id number]  HPI  07/15/2014 hospital discharge  Physician Discharge Summary   Patient ID: Stephanie Crawford MRN: [id number] DOB/AGE: 10/28/1945 69 y.o.  Admit date: 07/15/2014 Discharge date: 07/17/2014  PCP: Stephanie Pillar, MD  DISCHARGE DIAGNOSES:  Active Problems:  Rash and nonspecific skin eruption  Foot ulcer   RECOMMENDATIONS FOR OUTPATIENT FOLLOW UP: 1. Patient asked to see a dermatologist. They have made an appointment with Stephanie Crawford on Tuesday.   DISCHARGE CONDITION: fair  Diet recommendation: Regular       TOday  Stephanie Crawford returns after HFU  She has seen her dermatolgoist  Stephanie Crawford   See note  Reports R foot improving  She is stilll on antibiotics  Review of Systems    see HPI Objective:   Physical Exam  Physical Exam  Nursing note and vitals reviewed.  Constitutional: She is oriented to person, place, and time. She appears well-developed and well-nourished.  HENT:  Head: Normocephalic and atraumatic.  Cardiovascular: Normal rate and regular rhythm. Exam reveals no gallop and no friction rub.  No murmur heard.  Pulmonary/Chest: Breath sounds normal. She has no wheezes. She has no rales.  Neurological: She is alert and oriented to person, place, and time.  Skin: Skin is warm and dry.  R foot  Ulcerated area on medial plantar surface appear to be healing Psychiatric: She has a normal mood and affect. Her behavior is normal.             Assessment & Plan:  MRSA infection R foot improving  Continue to finish  Doxy and levaquin  Continue instructions of dermatologist  Tinea  See derm note  Foot ulcer  Healing  To continue to follow with Derm

## 2014-07-23 ENCOUNTER — Encounter: Payer: Self-pay | Admitting: Internal Medicine

## 2014-07-27 ENCOUNTER — Encounter: Payer: Self-pay | Admitting: *Deleted

## 2014-07-31 ENCOUNTER — Telehealth: Payer: Self-pay | Admitting: *Deleted

## 2014-07-31 LAB — CULTURE, FUNGUS WITHOUT SMEAR

## 2014-08-05 DIAGNOSIS — L819 Disorder of pigmentation, unspecified: Secondary | ICD-10-CM | POA: Diagnosis not present

## 2014-08-05 DIAGNOSIS — L409 Psoriasis, unspecified: Secondary | ICD-10-CM | POA: Diagnosis not present

## 2014-08-05 DIAGNOSIS — L918 Other hypertrophic disorders of the skin: Secondary | ICD-10-CM | POA: Diagnosis not present

## 2014-08-26 ENCOUNTER — Encounter: Payer: Self-pay | Admitting: Internal Medicine

## 2014-08-26 ENCOUNTER — Ambulatory Visit (INDEPENDENT_AMBULATORY_CARE_PROVIDER_SITE_OTHER): Payer: BLUE CROSS/BLUE SHIELD | Admitting: Internal Medicine

## 2014-08-26 VITALS — BP 123/78 | HR 92 | Resp 16 | Ht 69.0 in | Wt 221.0 lb

## 2014-08-26 DIAGNOSIS — Z8614 Personal history of Methicillin resistant Staphylococcus aureus infection: Secondary | ICD-10-CM | POA: Diagnosis not present

## 2014-08-26 LAB — CBC WITH DIFFERENTIAL/PLATELET
BASOS ABS: 0.1 10*3/uL (ref 0.0–0.1)
Basophils Relative: 3 % — ABNORMAL HIGH (ref 0–1)
EOS ABS: 0.3 10*3/uL (ref 0.0–0.7)
EOS PCT: 7 % — AB (ref 0–5)
HCT: 40.8 % (ref 36.0–46.0)
Hemoglobin: 13.6 g/dL (ref 12.0–15.0)
Lymphocytes Relative: 46 % (ref 12–46)
Lymphs Abs: 2.2 10*3/uL (ref 0.7–4.0)
MCH: 29.7 pg (ref 26.0–34.0)
MCHC: 33.3 g/dL (ref 30.0–36.0)
MCV: 89.1 fL (ref 78.0–100.0)
MPV: 10.4 fL (ref 8.6–12.4)
Monocytes Absolute: 0.4 10*3/uL (ref 0.1–1.0)
Monocytes Relative: 9 % (ref 3–12)
Neutro Abs: 1.7 10*3/uL (ref 1.7–7.7)
Neutrophils Relative %: 35 % — ABNORMAL LOW (ref 43–77)
PLATELETS: 306 10*3/uL (ref 150–400)
RBC: 4.58 MIL/uL (ref 3.87–5.11)
RDW: 13.4 % (ref 11.5–15.5)
WBC: 4.8 10*3/uL (ref 4.0–10.5)

## 2014-08-26 MED ORDER — NYSTATIN-TRIAMCINOLONE 100000-0.1 UNIT/GM-% EX OINT: 1.0000 "application " | TOPICAL_OINTMENT | Freq: Two times a day (BID) | CUTANEOUS | Status: DC

## 2014-08-26 MED ORDER — LEVOFLOXACIN 750 MG PO TABS: 750.0000 mg | ORAL_TABLET | Freq: Every day | ORAL | Status: DC

## 2014-08-26 MED ORDER — CEFTRIAXONE SODIUM 1 G IJ SOLR
1.0000 g | Freq: Once | INTRAMUSCULAR | Status: AC
  Administered 2014-08-26: 1 g via INTRAMUSCULAR

## 2014-08-26 MED ORDER — FLUCONAZOLE 200 MG PO TABS: ORAL_TABLET | ORAL | Status: DC

## 2014-08-26 MED ORDER — AMPICILLIN 500 MG PO CAPS: ORAL_CAPSULE | ORAL | Status: DC

## 2014-08-26 NOTE — Progress Notes (Signed)
Subjective:    Patient ID: Stephanie Crawford, female    DOB: May 06, 1946, 69 y.o.   MRN: [id number]  HPI  07/22/2014 Assessment & Plan:  MRSA infection R foot improving Continue to finish Doxy and levaquin Continue instructions of dermatologist  Tinea See derm note  Foot ulcer Healing To continue to follow with Derm         TODAY  Stephanie Crawford is here for acute visit.      She has multiple concerns.    She has a new skin lesion develop on posterior left calf . Began as nodule that she tells me was present prior to her recent MRSA R foot infection that was treated on 2/23.  PT was hospitalized and wound CX grew enterococus and MRSA  She completed her outpt antibiotics with Doxycycline an Levofloxacin     Nodule left calf grew in size since [place] and is now itchy.  Noted blistering 1 week ago    Not painful  No fever   Secondly lots of external vaginal itching and whitish vaginal discharge since completing antibiotics  Thirdly  She has R knee swelling that has been chronic  Imaging done last fall revealed patellar spurs and mild DJD   No Known Allergies Past Medical History  Diagnosis Date  . Allergy   . Anemia     prior to hysterectomy--1997  . GERD (gastroesophageal reflux disease)   . Heart murmur   . Hemorrhoid 2011  . History of shingles 04/2014   Past Surgical History  Procedure Laterality Date  . Appendectomy  1960  . Abdominal hysterectomy  1997  . Lipoma excision  1975    neck, left breast and righ jaw.  . Hemorrhoid surgery  9/11    8/27 had a follow up procedure.     History   Social History  . Marital Status: Married    Spouse Name: N/A  . Number of Children: N/A  . Years of Education: N/A   Occupational History  . Not on file.   Social History Main Topics  . Smoking status: Never Smoker   . Smokeless tobacco: Never Used  . Alcohol Use: No  . Drug Use: Not on file  . Sexual Activity: Yes   Other Topics Concern  . Not on file   Social  History Narrative   Family History  Problem Relation Age of Onset  . Cancer Father     prostate  . Asthma Daughter   . Cancer Maternal Grandmother     lung   Patient Active Problem List   Diagnosis Date Noted  . Foot ulcer 07/15/2014  . Osteoporosis 01/30/2013  . Vitamin D deficiency 01/13/2013  . Other and unspecified hyperlipidemia 01/06/2013  . Breast thickening 01/06/2013  . Skin lesion of right leg 09/19/2012  . Allergic rhinitis 09/19/2012  . Elevated bilirubin 07/12/2012  . History of cardiac murmur 07/12/2012  . Rash and nonspecific skin eruption 07/11/2012  . Rectal bleeding 05/11/2011  . Abnormal EKG 03/01/2011  . General medical examination 03/01/2011  . OSTEOPENIA 12/24/2009  . GERD 12/08/2009  . ANKLE EDEMA 12/08/2009   Current Outpatient Prescriptions on File Prior to Visit  Medication Sig Dispense Refill  . calcium carbonate (OS-CAL) 600 MG TABS tablet Take 1,200 mg by mouth daily.    . cholecalciferol (VITAMIN D) 1000 UNITS tablet Take 2,000 Units by mouth daily.    Marland Kitchen levofloxacin (LEVAQUIN) 500 MG tablet Take 1 tablet (500 mg total) by mouth daily. For  10 days starting 07/18/14 10 tablet 0  . pantoprazole (PROTONIX) 20 MG tablet Take 1 tablet (20 mg total) by mouth daily. 90 tablet 1  . terbinafine (LAMISIL) 250 MG tablet Take 1 tablet (250 mg total) by mouth daily. 14 tablet 0   No current facility-administered medications on file prior to visit.      Review of Systems See HPI    Objective:   Physical Exam  Physical Exam  Nursing note and vitals reviewed.  Constitutional: She is oriented to person, place, and time. She appears well-developed and well-nourished.  HENT:  Head: Normocephalic and atraumatic.  Cardiovascular: Normal rate and regular rhythm. Exam reveals no gallop and no friction rub.  No murmur heard.  Pulmonary/Chest: Breath sounds normal. She has no wheezes. She has no rales.  Neurological: She is alert and oriented to person,  place, and time.  Skin: Skin is warm and dry.   She has nummular shaped healing blisters R poserior calf.  No cellulitis no drainage R knee soft tissue swelling R foot  Near complete healing of ulcerations and cellulitis External pelvic exam Vulvar redness with satellite lesions labia majora Psychiatric: She has a normal mood and affect. Her behavior is normal.             Assessment & Plan:  Skin lesion  Left calf Unclear if this is related to MRSA vs herpetic.  I spoke with [name] who will see pt today.  Will get blood culture today .  Rocephin 1 gm given in office.  I gave printed RX for levo and ampicillin and will leave to discretion of dermatology if she should take these.  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  OK for tylenol ES 2 bid until I see her next week

## 2014-08-27 ENCOUNTER — Telehealth: Payer: Self-pay | Admitting: *Deleted

## 2014-08-27 DIAGNOSIS — L0100 Impetigo, unspecified: Secondary | ICD-10-CM | POA: Diagnosis not present

## 2014-08-27 DIAGNOSIS — L400 Psoriasis vulgaris: Secondary | ICD-10-CM | POA: Diagnosis not present

## 2014-08-27 NOTE — Telephone Encounter (Signed)
-----   Message from Lanice Shirts, MD sent at 08/27/2014 11:20 AM EDT ----- Call pt and let her know that her WBC is normal and blood culture so far is negative but they are still processing her blood culture   ADvise her to keep follow up appt with me on 4/13

## 2014-08-27 NOTE — Telephone Encounter (Signed)
I spoke with patient in regards to her lab results. She was advised to keep her appointment with [place] next week

## 2014-09-01 LAB — CULTURE, BLOOD (SINGLE): Organism ID, Bacteria: NO GROWTH

## 2014-09-02 ENCOUNTER — Encounter: Payer: Self-pay | Admitting: Internal Medicine

## 2014-09-02 ENCOUNTER — Ambulatory Visit (INDEPENDENT_AMBULATORY_CARE_PROVIDER_SITE_OTHER): Payer: BLUE CROSS/BLUE SHIELD | Admitting: Internal Medicine

## 2014-09-02 VITALS — BP 123/76 | HR 90 | Resp 16 | Ht 69.0 in | Wt 221.0 lb

## 2014-09-02 DIAGNOSIS — M25561 Pain in right knee: Secondary | ICD-10-CM | POA: Diagnosis not present

## 2014-09-02 DIAGNOSIS — L988 Other specified disorders of the skin and subcutaneous tissue: Secondary | ICD-10-CM

## 2014-09-02 MED ORDER — AZELASTINE HCL 0.05 % OP SOLN: 1.0000 [drp] | Freq: Two times a day (BID) | OPHTHALMIC | Status: DC

## 2014-09-02 MED ORDER — NABUMETONE 500 MG PO TABS: ORAL_TABLET | ORAL | Status: DC

## 2014-09-02 NOTE — Progress Notes (Signed)
Subjective:    Patient ID: Stephanie Crawford, female    DOB: [birth year], 69 y.o.   MRN: [id number]  HPI  08/26/2014 note Assessment & Plan:  Skin lesion Left calf Unclear if this is related to MRSA vs herpetic. I spoke with [name] who will see pt today. Will get blood culture today . Rocephin 1 gm given in office. I gave printed RX for levo and ampicillin and will leave to discretion of dermatology if she should take these. Copy of wound culture results given to pt   Yeast vaginitis Ok for [employer] and 3 Days of diflucan  R knee swellling this has been chronic for her She does have patellar spurs. 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  She was treated with Doxycycline and Halog ointment   See scanned note  Pt reports lesion is improving but still a little red .  She is on Doxycycline and Halog  She tells me dermatolgist did do a biopsy     She has chronic R knee swelling that has flared up.   She reports twisting when she gets in and out of her car but no acute injury  See prior xray report    No Known Allergies Past Medical History  Diagnosis Date  . Allergy   . Anemia     prior to hysterectomy--1997  . GERD (gastroesophageal reflux disease)   . Heart murmur   . Hemorrhoid 2011  . History of shingles 04/2014   Past Surgical History  Procedure Laterality Date  . Appendectomy  1960  . Abdominal hysterectomy  1997  . Lipoma excision  1975    neck, left breast and righ jaw.  . Hemorrhoid surgery  9/11    8/27 had a follow up procedure.     History   Social History  . Marital Status: Married    Spouse Name: N/A  . Number of Children: N/A  . Years of Education: N/A   Occupational History  . Not on file.   Social History Main Topics  . Smoking status:  Never Smoker   . Smokeless tobacco: Never Used  . Alcohol Use: No  . Drug Use: Not on file  . Sexual Activity: Yes   Other Topics Concern  . Not on file   Social History Narrative   Family History  Problem Relation Age of Onset  . Cancer Father     prostate  . Asthma Daughter   . Cancer Maternal Grandmother     lung   Patient Active Problem List   Diagnosis Date Noted  . Foot ulcer 07/15/2014  . Osteoporosis 01/30/2013  . Vitamin D deficiency 01/13/2013  . Other and unspecified hyperlipidemia 01/06/2013  . Breast thickening 01/06/2013  . Skin lesion of right leg 09/19/2012  . Allergic rhinitis 09/19/2012  . Elevated bilirubin 07/12/2012  . History of cardiac murmur 07/12/2012  . Rash and nonspecific skin eruption 07/11/2012  . Rectal bleeding 05/11/2011  . Abnormal EKG 03/01/2011  . General medical examination 03/01/2011  . OSTEOPENIA 12/24/2009  . GERD 12/08/2009  . ANKLE EDEMA 12/08/2009   Current Outpatient Prescriptions on File Prior to Visit  Medication Sig Dispense Refill  . ampicillin (PRINCIPEN) 500 MG capsule One po TID  30 capsule 0  . calcium carbonate (OS-CAL) 600 MG TABS tablet Take 1,200 mg by mouth daily.    . cholecalciferol (VITAMIN D) 1000 UNITS tablet Take 2,000 Units by mouth daily.    . fluconazole (DIFLUCAN) 200 MG tablet One daily for 3 days 3 tablet 0  . levofloxacin (LEVAQUIN) 750 MG tablet Take 1 tablet (750 mg total) by mouth daily. 14 tablet 0  . nystatin-triamcinolone ointment (MYCOLOG) Apply 1 application topically 2 (two) times daily. 30 g 0  . pantoprazole (PROTONIX) 20 MG tablet Take 1 tablet (20 mg total) by mouth daily. 90 tablet 1  . terbinafine (LAMISIL) 250 MG tablet Take 1 tablet (250 mg total) by mouth daily. 14 tablet 0   No current facility-administered medications on file prior to visit.      Review of Systems See HPI    Objective:   Physical Exam Physical Exam  Nursing note and vitals reviewed.  Constitutional:  She is oriented to person, place, and time. She appears well-developed and well-nourished.  HENT:  Head: Normocephalic and atraumatic.  Cardiovascular: Normal rate and regular rhythm. Exam reveals no gallop and no friction rub.  No murmur heard.  Pulmonary/Chest: Breath sounds normal. She has no wheezes. She has no rales.  Neurological: She is alert and oriented to person, place, and time.  Skin: Skin is warm and dry.   Plaque on left posterior calf improving  With minor redness  M/S  R knee slighlty edematous.  Pos crepitus  No ant drawar sign Psychiatric: She has a normal mood and affect. Her behavior is normal.              Assessment & Plan:  R knee pain   Ok for RElafen bid for 10 days  Pt does not like to take meds.    Ok to refer to Dr. Barbaraann Barthel for possible steroid injection   Plaque left calf  Advised to follow with dermatolgy.   BX and wound culture  was taken but pt does not know results  Pt informed of my departure and closure of practice.   Given letter with alternative PCP options and advised pt to call for appt this week

## 2014-09-03 ENCOUNTER — Encounter: Payer: Self-pay | Admitting: *Deleted

## 2014-09-22 DIAGNOSIS — L400 Psoriasis vulgaris: Secondary | ICD-10-CM | POA: Diagnosis not present

## 2014-10-15 DIAGNOSIS — D485 Neoplasm of uncertain behavior of skin: Secondary | ICD-10-CM | POA: Diagnosis not present

## 2014-11-05 ENCOUNTER — Encounter: Payer: Self-pay | Admitting: Emergency Medicine

## 2014-11-05 ENCOUNTER — Emergency Department (INDEPENDENT_AMBULATORY_CARE_PROVIDER_SITE_OTHER)
Admission: EM | Admit: 2014-11-05 | Discharge: 2014-11-05 | Disposition: A | Discharge status: Home / Self Care | Payer: BLUE CROSS/BLUE SHIELD | Source: Home / Self Care | Attending: Emergency Medicine | Admitting: Emergency Medicine

## 2014-11-05 DIAGNOSIS — J042 Acute laryngotracheitis: Secondary | ICD-10-CM | POA: Diagnosis not present

## 2014-11-05 DIAGNOSIS — J029 Acute pharyngitis, unspecified: Secondary | ICD-10-CM

## 2014-11-05 MED ORDER — AZITHROMYCIN 250 MG PO TABS: ORAL_TABLET | ORAL | Status: DC

## 2014-11-05 NOTE — ED Notes (Signed)
Pt c/o sore throat, cough and some congestion x2 days. Denies fever.

## 2014-11-05 NOTE — ED Provider Notes (Signed)
CSN: [id number]     Arrival date & time 11/05/14  1944 History   First MD Initiated Contact with Patient 11/05/14 1952     Chief Complaint  Patient presents with  . Sore Throat   (Consider location/radiation/quality/duration/timing/severity/associated sxs/prior Treatment) HPI SORE THROAT Associated with hoarseness and cough  Onset: 2-3 days    Severity: moderate-severe, progressively worsening Tried OTC meds without significant relief.  Symptoms:  Possible low-grade Fever  + Swollen neck glands No Recent Strep Exposure     No Myalgias Mild, nonfocal Headache No Rash  No Discolored Nasal Mucus Mild sinus congestion and drainage. No acute Allergy symptoms Mild sinus pain/pressure No itchy/red eyes No earache  No Drooling No Trismus  No Nausea No Vomiting No Abdominal pain No Diarrhea No Reflux symptoms  Mild, nonproductive Cough No Breathing Difficulty No Shortness of Breath No pleuritic pain No Wheezing No Hemoptysis Positive for fatigue but no focal neurologic symptoms   Past Medical History  Diagnosis Date  . Allergy   . Anemia     prior to hysterectomy--1997  . GERD (gastroesophageal reflux disease)   . Heart murmur   . Hemorrhoid 2011  . History of shingles 04/2014   Past Surgical History  Procedure Laterality Date  . Appendectomy  1960  . Abdominal hysterectomy  1997  . Lipoma excision  1975    neck, left breast and righ jaw.  . Hemorrhoid surgery  9/11    8/27 had a follow up procedure.     Family History  Problem Relation Age of Onset  . Cancer Father     prostate  . Asthma Daughter   . Cancer Maternal Grandmother     lung   History  Substance Use Topics  . Smoking status: Never Smoker   . Smokeless tobacco: Never Used  . Alcohol Use: No   OB History    No data available     Review of Systems  All other systems reviewed and are negative.   Allergies  Review of patient's allergies indicates no known allergies.  Home  Medications   Prior to Admission medications   Medication Sig Start Date End Date Taking? Authorizing Provider  azithromycin (ZITHROMAX Z-PAK) 250 MG tablet Take 2 tablets on day one, then 1 tablet daily on days 2 through 5 11/05/14   Jacqulyn Cane, MD  calcium carbonate (OS-CAL) 600 MG TABS tablet Take 1,200 mg by mouth daily.    Historical Provider, MD  cholecalciferol (VITAMIN D) 1000 UNITS tablet Take 2,000 Units by mouth daily.    Historical Provider, MD  nystatin-triamcinolone ointment (MYCOLOG) Apply 1 application topically 2 (two) times daily. 08/26/14   Lanice Shirts, MD  pantoprazole (PROTONIX) 20 MG tablet Take 1 tablet (20 mg total) by mouth daily. 01/28/14   Lanice Shirts, MD   BP 137/89 mmHg  Pulse 104  Temp(Src) 98.6 F (37 C) (Oral)  SpO2 98%  LMP 05/23/1995 Physical Exam  Constitutional: She is oriented to person, place, and time. She appears well-developed and well-nourished. She is cooperative.  Non-toxic appearance. No distress.  HENT:  Head: Normocephalic and atraumatic.  Right Ear: Tympanic membrane, external ear and ear canal normal.  Left Ear: Tympanic membrane, external ear and ear canal normal.  Nose: Nose normal. Right sinus exhibits no maxillary sinus tenderness and no frontal sinus tenderness. Left sinus exhibits no maxillary sinus tenderness and no frontal sinus tenderness.  Mouth/Throat: Mucous membranes are normal. Posterior oropharyngeal erythema present. No oropharyngeal exudate or posterior  oropharyngeal edema.  She is hoarse voice  Eyes: Conjunctivae are normal. No scleral icterus.  Neck: Neck supple.  Cardiovascular: Normal rate, regular rhythm and normal heart sounds.   No murmur heard. Pulmonary/Chest: Effort normal and breath sounds normal. No stridor. No respiratory distress. She has no wheezes. She has no rales.  Musculoskeletal: She exhibits no edema.  Lymphadenopathy:    She has cervical adenopathy.       Right cervical: Superficial  cervical adenopathy present. No deep cervical and no posterior cervical adenopathy present.      Left cervical: Superficial cervical adenopathy present. No deep cervical and no posterior cervical adenopathy present.  Neurological: She is alert and oriented to person, place, and time.  Skin: Skin is warm and dry. No rash noted.  Psychiatric: She has a normal mood and affect.  Nursing note and vitals reviewed.   ED Course  Procedures (including critical care time) Labs Review Labs Reviewed - No data to display  Imaging Review No results found.   MDM   1. Acute pharyngitis, unspecified pharyngitis type   2. Acute laryngitis and tracheitis     Rapid strep test negative Treatment options discussed, as well as risks, benefits, alternatives. Patient voiced understanding and agreement with the following plans: New Prescriptions   AZITHROMYCIN (ZITHROMAX Z-PAK) 250 MG TABLET    Take 2 tablets on day one, then 1 tablet daily on days 2 through 5   We gave her 400 mg ibuprofen by mouth stat for pain relief. May take this every 6 hours when necessary pain. Other symptomatic care discussed. She no longer is associated with her prior PCP, and I urged her to establish with a new PCP. Follow-up with primary care doctor in 5-7 days if not improving, or sooner if symptoms become worse. Precautions discussed. Red flags discussed. Questions invited and answered. Patient voiced understanding and agreement.   Jacqulyn Cane, MD 11/05/14 2010

## 2015-04-23 ENCOUNTER — Encounter: Payer: Self-pay | Admitting: Family Medicine

## 2015-04-23 ENCOUNTER — Ambulatory Visit (INDEPENDENT_AMBULATORY_CARE_PROVIDER_SITE_OTHER): Payer: BLUE CROSS/BLUE SHIELD | Admitting: Family Medicine

## 2015-04-23 VITALS — BP 128/81 | HR 91 | Ht 69.0 in | Wt 215.0 lb

## 2015-04-23 DIAGNOSIS — E559 Vitamin D deficiency, unspecified: Secondary | ICD-10-CM

## 2015-04-23 DIAGNOSIS — M25561 Pain in right knee: Secondary | ICD-10-CM | POA: Diagnosis not present

## 2015-04-23 MED ORDER — METHYLPREDNISOLONE ACETATE 40 MG/ML IJ SUSP
40.0000 mg | Freq: Once | INTRAMUSCULAR | Status: AC
  Administered 2015-04-23: 40 mg via INTRA_ARTICULAR

## 2015-04-23 NOTE — Patient Instructions (Signed)
Your knee pain is due to arthritis vs a degenerative medial meniscus tear - both are treated similarly. These are the medicines you can take for this: Tylenol '500mg'$  1-2 tabs three times a day for pain. Aleve 1-2 tabs twice a day with food Glucosamine sulfate '750mg'$  twice a day is a supplement that may help. Capsaicin, aspercreme, or biofreeze topically up to four times a day may also help with pain. Cortisone injections are an option - you were given this today. If cortisone injections do not help, there are different types of shots that may help but they take longer to take effect. It's important that you continue to stay active. Straight leg raises, knee extensions 3 sets of 10 once a day (add ankle weight if these become too easy). Consider physical therapy to strengthen muscles around the joint that hurts to take pressure off of the joint itself. Shoe inserts with good arch support may be helpful. Heat or ice 15 minutes at a time 3-4 times a day as needed to help with pain. Water aerobics and cycling with low resistance are the best two types of exercise for arthritis. Follow up with me in 1 month. If not improving would consider an MRI.

## 2015-04-28 DIAGNOSIS — M25561 Pain in right knee: Secondary | ICD-10-CM | POA: Insufficient documentation

## 2015-04-28 NOTE — Progress Notes (Signed)
PCP: Kelton Pillar, MD  Subjective:   HPI: Patient is a 69 y.o. female here for right knee pain.  Patient denies known injury or trauma. States she's had right knee pain for 12 months - anteromedial. Worse after prolonged sitting. Pain level 7/10, sharp. Impacting her ability to sleep. Has been icing, using heat, and massage. Had been coming and going until a month ago. No skin changes, fever, other complaints.  Past Medical History  Diagnosis Date  . Allergy   . Anemia     prior to hysterectomy--1997  . GERD (gastroesophageal reflux disease)   . Heart murmur   . Hemorrhoid 2011  . History of shingles 04/2014    Current Outpatient Prescriptions on File Prior to Visit  Medication Sig Dispense Refill  . calcium carbonate (OS-CAL) 600 MG TABS tablet Take 1,200 mg by mouth daily.    . cholecalciferol (VITAMIN D) 1000 UNITS tablet Take 2,000 Units by mouth daily.    Marland Kitchen nystatin-triamcinolone ointment (MYCOLOG) Apply 1 application topically 2 (two) times daily. 30 g 0  . pantoprazole (PROTONIX) 20 MG tablet Take 1 tablet (20 mg total) by mouth daily. 90 tablet 1   No current facility-administered medications on file prior to visit.    Past Surgical History  Procedure Laterality Date  . Appendectomy  1960  . Abdominal hysterectomy  1997  . Lipoma excision  1975    neck, left breast and righ jaw.  . Hemorrhoid surgery  9/11    8/27 had a follow up procedure.      No Known Allergies  Social History   Social History  . Marital Status: Married    Spouse Name: N/A  . Number of Children: N/A  . Years of Education: N/A   Occupational History  . Not on file.   Social History Main Topics  . Smoking status: Never Smoker   . Smokeless tobacco: Never Used  . Alcohol Use: No  . Drug Use: Not on file  . Sexual Activity: Yes   Other Topics Concern  . Not on file   Social History Narrative    Family History  Problem Relation Age of Onset  . Cancer Father    prostate  . Asthma Daughter   . Cancer Maternal Grandmother     lung    BP 128/81 mmHg  Pulse 91  Ht '5\' 9"'$  (1.753 m)  Wt 215 lb (97.523 kg)  BMI 31.74 kg/m2  LMP 05/23/1995  Review of Systems: See HPI above.    Objective:  Physical Exam:  Gen: NAD  Right knee: No gross deformity, ecchymoses, effusion. Mild TTP medial joint line.  No other tenderness. FROM. Negative ant/post drawers. Negative valgus/varus testing. Negative lachmanns. Negative mcmurrays, apleys, patellar apprehension. NV intact distally.  Left knee: FROM without pain.    Assessment & Plan:  1. 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  Independently reviewed radiographs from a year ago and show mild DJD.  Discussed tylenol, nsaids, glucosamine, topical medications.  Injection given today.  Shown home exercises to do daily.  Heat/ice if needed.  F/u in 1 month.  Consider MRI if not improving.  After informed written consent, patient was seated on exam table. Right knee was prepped with alcohol swab and utilizing anteromedial approach, patient's right knee was injected intraarticularly with 3:1 marcaine: depomedrol. Patient tolerated the procedure well without immediate complications.

## 2015-04-28 NOTE — Assessment & Plan Note (Signed)
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  Independently reviewed radiographs from a year ago and show mild DJD.  Discussed tylenol, nsaids, glucosamine, topical medications.  Injection given today.  Shown home exercises to do daily.  Heat/ice if needed.  F/u in 1 month.  Consider MRI if not improving.  After informed written consent, patient was seated on exam table. Right knee was prepped with alcohol swab and utilizing anteromedial approach, patient's right knee was injected intraarticularly with 3:1 marcaine: depomedrol. Patient tolerated the procedure well without immediate complications.

## 2015-05-10 DIAGNOSIS — M818 Other osteoporosis without current pathological fracture: Secondary | ICD-10-CM | POA: Diagnosis not present

## 2015-05-10 DIAGNOSIS — E6609 Other obesity due to excess calories: Secondary | ICD-10-CM | POA: Diagnosis not present

## 2015-05-10 DIAGNOSIS — M4056 Lordosis, unspecified, lumbar region: Secondary | ICD-10-CM | POA: Diagnosis not present

## 2015-05-10 DIAGNOSIS — E559 Vitamin D deficiency, unspecified: Secondary | ICD-10-CM | POA: Diagnosis not present

## 2015-05-10 DIAGNOSIS — Z79899 Other long term (current) drug therapy: Secondary | ICD-10-CM | POA: Diagnosis not present

## 2015-05-10 DIAGNOSIS — M25561 Pain in right knee: Secondary | ICD-10-CM | POA: Diagnosis not present

## 2015-05-10 DIAGNOSIS — M2550 Pain in unspecified joint: Secondary | ICD-10-CM | POA: Diagnosis not present

## 2015-05-20 ENCOUNTER — Ambulatory Visit (INDEPENDENT_AMBULATORY_CARE_PROVIDER_SITE_OTHER): Payer: BLUE CROSS/BLUE SHIELD | Admitting: Family Medicine

## 2015-05-20 ENCOUNTER — Encounter: Payer: Self-pay | Admitting: Family Medicine

## 2015-05-20 VITALS — BP 136/85 | HR 99 | Ht 69.0 in | Wt 215.0 lb

## 2015-05-20 DIAGNOSIS — M25561 Pain in right knee: Secondary | ICD-10-CM

## 2015-05-20 MED ORDER — METHYLPREDNISOLONE ACETATE 40 MG/ML IJ SUSP
40.0000 mg | Freq: Once | INTRAMUSCULAR | Status: AC
  Administered 2015-05-20: 40 mg via INTRA_ARTICULAR

## 2015-05-20 NOTE — Patient Instructions (Signed)
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 Do hacky sack, hamstring curl exercises 3 sets of 10 once a day. Add ankle weights if these become too easy. Call me if over the next 1-2 weeks you're still not improving and i'd consider doing an MRI of this knee. Continue your celebrex in the meantime as well.

## 2015-05-21 NOTE — Progress Notes (Signed)
PCP: Kelton Pillar, MD  Subjective:   HPI: Patient is a 69 y.o. female here for right knee pain.  12/2: Patient denies known injury or trauma. States she's had right knee pain for 12 months - anteromedial. Worse after prolonged sitting. Pain level 7/10, sharp. Impacting her ability to sleep. Has been icing, using heat, and massage. Had been coming and going until a month ago. No skin changes, fever, other complaints.  12/29: Patient reports she has improved mildly. Pain level down to 3/10. Mostly medial now just below knee. Pain is more dull. Had radiographs at Dr. Anette Guarneri - showed mild medial and patellofemoral DJD. Worse getting up from sitting and with inclines, stairs. No skin changes, fever, other complaints.  Past Medical History  Diagnosis Date  . Allergy   . Anemia     prior to hysterectomy--1997  . GERD (gastroesophageal reflux disease)   . Heart murmur   . Hemorrhoid 2011  . History of shingles 04/2014    Current Outpatient Prescriptions on File Prior to Visit  Medication Sig Dispense Refill  . calcium carbonate (OS-CAL) 600 MG TABS tablet Take 1,200 mg by mouth daily.    . cholecalciferol (VITAMIN D) 1000 UNITS tablet Take 2,000 Units by mouth daily.    Marland Kitchen nystatin-triamcinolone ointment (MYCOLOG) Apply 1 application topically 2 (two) times daily. 30 g 0  . pantoprazole (PROTONIX) 20 MG tablet Take 1 tablet (20 mg total) by mouth daily. 90 tablet 1   No current facility-administered medications on file prior to visit.    Past Surgical History  Procedure Laterality Date  . Appendectomy  1960  . Abdominal hysterectomy  1997  . Lipoma excision  1975    neck, left breast and righ jaw.  . Hemorrhoid surgery  9/11    8/27 had a follow up procedure.      No Known Allergies  Social History   Social History  . Marital Status: Married    Spouse Name: N/A  . Number of Children: N/A  . Years of Education: N/A   Occupational History  . Not on  file.   Social History Main Topics  . Smoking status: Never Smoker   . Smokeless tobacco: Never Used  . Alcohol Use: No  . Drug Use: Not on file  . Sexual Activity: Yes   Other Topics Concern  . Not on file   Social History Narrative    Family History  Problem Relation Age of Onset  . Cancer Father     prostate  . Asthma Daughter   . Cancer Maternal Grandmother     lung    BP 136/85 mmHg  Pulse 99  Ht '5\' 9"'$  (1.753 m)  Wt 215 lb (97.523 kg)  BMI 31.74 kg/m2  LMP 05/23/1995  Review of Systems: See HPI above.    Objective:  Physical Exam:  Gen: NAD  Right knee: No gross deformity, ecchymoses, effusion. Mod TTP pes bursa, minimal medial joint line.  No other tenderness. FROM.  Pain on resisted hamstring flexion and sartorius testing. Negative ant/post drawers. Negative valgus/varus testing. Negative lachmanns. Negative mcmurrays, apleys, patellar apprehension. NV intact distally.  Left knee: FROM without pain.    Assessment & Plan:  1. 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  Has improved though now primary issue is pes bursitis.  Discussed options - she would like to try local injection and home exercises for this.  Consider MRI if not improving.    After informed  written consent, patient was seated on exam table. Area overlying right pes bursa was prepped with alcohol swab and injected with 2:1 marcaine: depomedrol. Patient tolerated the procedure well without immediate complications.

## 2015-05-21 NOTE — Assessment & Plan Note (Signed)
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  Has improved though now primary issue is pes bursitis.  Discussed options - she would like to try local injection and home exercises for this.  Consider MRI if not improving.    After informed written consent, patient was seated on exam table. Area overlying right pes bursa was prepped with alcohol swab and injected with 2:1 marcaine: depomedrol. Patient tolerated the procedure well without immediate complications.

## 2015-08-27 DIAGNOSIS — E559 Vitamin D deficiency, unspecified: Secondary | ICD-10-CM | POA: Diagnosis not present

## 2015-09-24 ENCOUNTER — Other Ambulatory Visit: Payer: Self-pay | Admitting: Internal Medicine

## 2015-09-24 DIAGNOSIS — N63 Unspecified lump in unspecified breast: Secondary | ICD-10-CM

## 2015-10-05 ENCOUNTER — Other Ambulatory Visit: Payer: Self-pay | Admitting: Internal Medicine

## 2015-10-05 ENCOUNTER — Ambulatory Visit
Admission: RE | Admit: 2015-10-05 | Discharge: 2015-10-05 | Disposition: A | Discharge status: Home / Self Care | Payer: Medicare Other | Source: Ambulatory Visit | Attending: Internal Medicine | Admitting: Internal Medicine

## 2015-10-05 DIAGNOSIS — N63 Unspecified lump in unspecified breast: Secondary | ICD-10-CM

## 2015-10-20 ENCOUNTER — Other Ambulatory Visit: Payer: Self-pay | Admitting: Internal Medicine

## 2015-10-20 DIAGNOSIS — N63 Unspecified lump in unspecified breast: Secondary | ICD-10-CM

## 2015-10-26 ENCOUNTER — Other Ambulatory Visit: Payer: Managed Care, Other (non HMO)

## 2015-11-01 DIAGNOSIS — N6012 Diffuse cystic mastopathy of left breast: Secondary | ICD-10-CM | POA: Diagnosis not present

## 2015-11-17 DIAGNOSIS — R0789 Other chest pain: Secondary | ICD-10-CM | POA: Diagnosis not present

## 2015-11-17 DIAGNOSIS — K219 Gastro-esophageal reflux disease without esophagitis: Secondary | ICD-10-CM | POA: Diagnosis not present

## 2016-01-20 DIAGNOSIS — M79641 Pain in right hand: Secondary | ICD-10-CM | POA: Diagnosis not present

## 2016-01-20 DIAGNOSIS — M461 Sacroiliitis, not elsewhere classified: Secondary | ICD-10-CM | POA: Diagnosis not present

## 2016-01-20 DIAGNOSIS — M25561 Pain in right knee: Secondary | ICD-10-CM | POA: Diagnosis not present

## 2016-01-20 DIAGNOSIS — M25551 Pain in right hip: Secondary | ICD-10-CM | POA: Diagnosis not present

## 2016-01-20 DIAGNOSIS — Z79899 Other long term (current) drug therapy: Secondary | ICD-10-CM | POA: Diagnosis not present

## 2016-02-26 ENCOUNTER — Other Ambulatory Visit: Payer: Self-pay | Admitting: Radiology

## 2016-02-26 DIAGNOSIS — Z79899 Other long term (current) drug therapy: Secondary | ICD-10-CM

## 2016-03-30 DIAGNOSIS — N6310 Unspecified lump in the right breast, unspecified quadrant: Secondary | ICD-10-CM | POA: Diagnosis not present

## 2016-03-30 DIAGNOSIS — M810 Age-related osteoporosis without current pathological fracture: Secondary | ICD-10-CM | POA: Diagnosis not present

## 2016-09-08 ENCOUNTER — Encounter (HOSPITAL_BASED_OUTPATIENT_CLINIC_OR_DEPARTMENT_OTHER): Payer: Self-pay | Admitting: *Deleted

## 2016-09-08 ENCOUNTER — Emergency Department (HOSPITAL_BASED_OUTPATIENT_CLINIC_OR_DEPARTMENT_OTHER)
Admission: EM | Admit: 2016-09-08 | Discharge: 2016-09-08 | Disposition: A | Discharge status: Home / Self Care | Payer: Managed Care, Other (non HMO) | Attending: Emergency Medicine | Admitting: Emergency Medicine

## 2016-09-08 ENCOUNTER — Emergency Department (HOSPITAL_BASED_OUTPATIENT_CLINIC_OR_DEPARTMENT_OTHER): Payer: Managed Care, Other (non HMO)

## 2016-09-08 DIAGNOSIS — R109 Unspecified abdominal pain: Secondary | ICD-10-CM | POA: Diagnosis not present

## 2016-09-08 DIAGNOSIS — N200 Calculus of kidney: Secondary | ICD-10-CM | POA: Diagnosis not present

## 2016-09-08 DIAGNOSIS — R911 Solitary pulmonary nodule: Secondary | ICD-10-CM | POA: Insufficient documentation

## 2016-09-08 DIAGNOSIS — R1032 Left lower quadrant pain: Secondary | ICD-10-CM | POA: Diagnosis present

## 2016-09-08 LAB — URINALYSIS, MICROSCOPIC (REFLEX)

## 2016-09-08 LAB — BASIC METABOLIC PANEL
Anion gap: 9 (ref 5–15)
BUN: 13 mg/dL (ref 6–20)
CHLORIDE: 100 mmol/L — AB (ref 101–111)
CO2: 25 mmol/L (ref 22–32)
Calcium: 9.4 mg/dL (ref 8.9–10.3)
Creatinine, Ser: 1.01 mg/dL — ABNORMAL HIGH (ref 0.44–1.00)
GFR calc Af Amer: >60 mL/min (ref >60)
GFR calc non Af Amer: 55 mL/min — ABNORMAL LOW (ref >60)
Glucose, Bld: 106 mg/dL — ABNORMAL HIGH (ref 65–99)
POTASSIUM: 3.7 mmol/L (ref 3.5–5.1)
SODIUM: 134 mmol/L — AB (ref 135–145)

## 2016-09-08 LAB — URINALYSIS, ROUTINE W REFLEX MICROSCOPIC
Bilirubin Urine: NEGATIVE
Glucose, UA: NEGATIVE mg/dL
Ketones, ur: 15 mg/dL — AB
Nitrite: NEGATIVE
PH: 5.5 (ref 5.0–8.0)
Protein, ur: NEGATIVE mg/dL
SPECIFIC GRAVITY, URINE: 1.023 (ref 1.005–1.030)

## 2016-09-08 LAB — CBC
HEMATOCRIT: 42.6 % (ref 36.0–46.0)
Hemoglobin: 14.4 g/dL (ref 12.0–15.0)
MCH: 30.5 pg (ref 26.0–34.0)
MCHC: 33.8 g/dL (ref 30.0–36.0)
MCV: 90.3 fL (ref 78.0–100.0)
Platelets: 310 10*3/uL (ref 150–400)
RBC: 4.72 MIL/uL (ref 3.87–5.11)
RDW: 12.5 % (ref 11.5–15.5)
WBC: 6.8 10*3/uL (ref 4.0–10.5)

## 2016-09-08 MED ORDER — IBUPROFEN 400 MG PO TABS: 400.0000 mg | ORAL_TABLET | Freq: Four times a day (QID) | ORAL | 0 refills | Status: DC | PRN

## 2016-09-08 MED ORDER — KETOROLAC TROMETHAMINE 30 MG/ML IJ SOLN
30.0000 mg | Freq: Once | INTRAMUSCULAR | Status: AC
  Administered 2016-09-08: 30 mg via INTRAVENOUS
  Filled 2016-09-08: qty 1

## 2016-09-08 MED ORDER — OXYCODONE-ACETAMINOPHEN 5-325 MG PO TABS: 1.0000 | ORAL_TABLET | Freq: Four times a day (QID) | ORAL | 0 refills | Status: DC | PRN

## 2016-09-08 MED FILL — IBUPROFEN 400 MG TABLET: 400 | 8 days supply | Qty: 30 | Fill #0

## 2016-09-08 MED FILL — OXYCODONE/APAP 5/325 MG TAB: 5-325 | 3 days supply | Qty: 10 | Fill #0

## 2016-09-08 NOTE — ED Triage Notes (Signed)
Intermittent left flank pain x 10 days.  Worsening pain last night.  Reports nausea denies vomiting.  Denies diarrhea.

## 2016-09-08 NOTE — Discharge Instructions (Signed)
Please read and follow all provided instructions.  Your diagnoses today include:  1. Nephrolithiasis   2. Pulmonary nodule    Tests performed today include: Urine test that showed blood in your urine and no infection CT scan which showed a small kidney stone on the left side Blood test that showed normal kidney function Vital signs. See below for your results today.   Medications prescribed:   Take any prescribed medications only as directed.  Home care instructions:  Follow any educational materials contained in this packet.  Please double your fluid intake for the next several days. Strain your urine and save any stones that may pass.   BE VERY CAREFUL not to take multiple medicines containing Tylenol (also called acetaminophen). Doing so can lead to an overdose which can damage your liver and cause liver failure and possibly death.  Follow-up instructions: Please follow-up with your urologist or the urologist referral (provided on front page) in the next 1 week for further evaluation of your symptoms.  If you need to return to the Emergency Department, go to [facility] and not [facility]. The urologists are located at [facility] and can better care for you at this location.  ALSO PLEASE FOLLOW UP WITH PRIMARY CARE DOCTOR FOR INCIDENTAL LUNG NODULE ON RIGHT LOWER LUNG. GET A REPEAT CHEST XRAY   Return instructions:  If you need to return to the Emergency Department, go to [facility] and not [facility]. The urologists are located at [facility] and can better care for you at this location.  Please return to the Emergency Department if you experience worsening symptoms.  Please return if you develop fever or uncontrolled pain or vomiting. Please return if you have any other emergent concerns.  Additional Information:  Your vital signs today were: BP (!) 141/83 (BP Location: Left Arm)    Pulse 97    Temp 98.7 F (37.1 C) (Oral)    Resp 18     Ht '5\' 10"'$  (1.778 m)    Wt 97.5 kg    LMP 05/23/1995    SpO2 98%    BMI 30.85 kg/m  If your blood pressure (BP) was elevated above 135/85 this visit, please have this repeated by your doctor within one month. --------------

## 2016-09-08 NOTE — ED Provider Notes (Signed)
[facility] Provider Note   CSN: [id number] Arrival date & time: 09/08/16  1523     History   Chief Complaint Chief Complaint  Patient presents with  . Flank Pain    HPI Stephanie Crawford is a 71 y.o. female.  HPI  71 y.o. female with a hx of GERD, presents to the Emergency Department today complaining of left flank pain intermittently x 2 weeks. Notes gradually improved last Wednesday, but return last night with same discomfort. States pain is aching sensation and isolated to left flank. Notes radiation into left groin. N/V intermittently. No abdominal pain. No diarrhea. No CP/SOB. No fevers. No chills/night sweats. No dysuria. No vaginal bleeding/discharge. Regular BMs. No other symptoms noted   Past Medical History:  Diagnosis Date  . Allergy   . Anemia    prior to hysterectomy--1997  . GERD (gastroesophageal reflux disease)   . Heart murmur   . Hemorrhoid 2011  . History of shingles 04/2014    Patient Active Problem List   Diagnosis Date Noted  . Right knee pain 04/28/2015  . Foot ulcer ([place]) 07/15/2014  . Osteoporosis 01/30/2013  . Vitamin D deficiency 01/13/2013  . Other and unspecified hyperlipidemia 01/06/2013  . Breast thickening 01/06/2013  . Skin lesion of right leg 09/19/2012  . Allergic rhinitis 09/19/2012  . Elevated bilirubin 07/12/2012  . History of cardiac murmur 07/12/2012  . Rash and nonspecific skin eruption 07/11/2012  . Rectal bleeding 05/11/2011  . Abnormal EKG 03/01/2011  . General medical examination 03/01/2011  . OSTEOPENIA 12/24/2009  . GERD 12/08/2009  . ANKLE EDEMA 12/08/2009    Past Surgical History:  Procedure Laterality Date  . ABDOMINAL HYSTERECTOMY  1997  . APPENDECTOMY  1960  . HEMORRHOID SURGERY  9/11   8/27 had a follow up procedure.    Marland Kitchen LIPOMA EXCISION  1975   neck, left breast and righ jaw.    OB History    No data available       Home Medications    Prior to Admission medications     Medication Sig Start Date End Date Taking? Authorizing Provider  calcium carbonate (OS-CAL) 600 MG TABS tablet Take 1,200 mg by mouth daily.    Historical Provider, MD  cholecalciferol (VITAMIN D) 1000 UNITS tablet Take 2,000 Units by mouth daily.    Historical Provider, MD    Family History Family History  Problem Relation Age of Onset  . Cancer Father     prostate  . Asthma Daughter   . Cancer Maternal Grandmother     lung    Social History Social History  Substance Use Topics  . Smoking status: Never Smoker  . Smokeless tobacco: Never Used  . Alcohol use No     Allergies   Patient has no known allergies.   Review of Systems Review of Systems ROS reviewed and all are negative for acute change except as noted in the HPI.  Physical Exam Updated Vital Signs BP (!) 141/83 (BP Location: Left Arm)   Pulse 97   Temp 98.7 F (37.1 C) (Oral)   Resp 18   Ht '5\' 10"'$  (1.778 m)   Wt 97.5 kg   LMP 05/23/1995   SpO2 98%   BMI 30.85 kg/m   Physical Exam  Constitutional: She is oriented to person, place, and time. Vital signs are normal. She appears well-developed and well-nourished.  NAD. Resting Comfortably   HENT:  Head: Normocephalic and atraumatic.  Right Ear: Hearing normal.  Left Ear: Hearing normal.  Eyes: Conjunctivae and EOM are normal. Pupils are equal, round, and reactive to light.  Neck: Normal range of motion.  Cardiovascular: Normal rate, regular rhythm, normal heart sounds and intact distal pulses.   Pulmonary/Chest: Effort normal and breath sounds normal.  Abdominal: Soft. Bowel sounds are normal. There is no rigidity, no rebound, no guarding, no CVA tenderness, no tenderness at McBurney's point and negative Murphy's sign.  Abdomen soft. Non tender. No CVA  Musculoskeletal: Normal range of motion.  Neurological: She is alert and oriented to person, place, and time.  Skin: Skin is warm and dry.  Psychiatric: She has a normal mood and affect. Her speech  is normal and behavior is normal. Thought content normal.  Nursing note and vitals reviewed.  ED Treatments / Results  Labs (all labs ordered are listed, but only abnormal results are displayed) Labs Reviewed  URINALYSIS, ROUTINE W REFLEX MICROSCOPIC - Abnormal; Notable for the following:       Result Value   APPearance CLOUDY (*)    Hgb urine dipstick LARGE (*)    Ketones, ur 15 (*)    Leukocytes, UA TRACE (*)    All other components within normal limits  BASIC METABOLIC PANEL - Abnormal; Notable for the following:    Sodium 134 (*)    Chloride 100 (*)    Glucose, Bld 106 (*)    Creatinine, Ser 1.01 (*)    GFR calc non Af Amer 55 (*)    All other components within normal limits  URINALYSIS, MICROSCOPIC (REFLEX) - Abnormal; Notable for the following:    Bacteria, UA FEW (*)    Squamous Epithelial / LPF 0-5 (*)    All other components within normal limits  CBC    EKG  EKG Interpretation None       Radiology Ct Renal Stone Study  Result Date: 09/08/2016 CLINICAL DATA:  71 year old female with intermittent left flank pain for the past 10 days EXAM: CT ABDOMEN AND PELVIS WITHOUT CONTRAST TECHNIQUE: Multidetector CT imaging of the abdomen and pelvis was performed following the standard protocol without IV contrast. COMPARISON:  None. FINDINGS: Lower chest: Small 3 mm pulmonary nodule in the periphery of the right lower lobe (image 3 series 4). Mild dependent atelectasis bilaterally. The heart is within normal limits. No pericardial effusion. Unremarkable distal thoracic esophagus. Hepatobiliary: Normal hepatic contour and morphology. No discrete hepatic lesions. Normal appearance of the gallbladder. No intra or extrahepatic biliary ductal dilatation. Pancreas: Unremarkable. No pancreatic ductal dilatation or surrounding inflammatory changes. Spleen: Normal in size without focal abnormality. Adrenals/Urinary Tract: Normal bilateral adrenal glands. The right kidney is unremarkable in  appearance. Mild left hydronephrosis with renal edema and perinephric stranding. The ureter is diffusely dilated throughout its course. There is a punctate stone at the UVJ. The bladder is decompressed. 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 The appendix is not visualized and may be surgically absent. No evidence of bowel obstruction or focal bowel wall thickening. Vascular/Lymphatic: No significant vascular findings are present. No enlarged abdominal or pelvic lymph nodes. Reproductive: Status post hysterectomy. No adnexal masses. Other: No abdominal wall hernia or abnormality. No abdominopelvic ascites. Musculoskeletal: No acute fracture or aggressive appearing lytic or blastic osseous lesion. L4-L5 and L5-S1 degenerative disc disease. IMPRESSION: 1. Punctate but obstructing left UVJ stone resulting in mild hydronephrosis, renal edema and perinephric stranding. 2. No additional nephrolithiasis identified. 3. **An incidental finding of potential clinical significance has been found. A 3  mm pulmonary nodule is noted in the right lower lobe. No follow-up needed if patient is low-risk. Non-contrast chest CT can be considered in 12 months if patient is high-risk. This recommendation follows the consensus statement: Guidelines for Management of Incidental Pulmonary Nodules Detected on CT Images: From the Fleischner Society 2017; Radiology 2017; 284:228-243.** 4. Colonic diverticular disease without CT evidence of active inflammation. 5. Lower lumbar degenerative disc disease. Electronically Signed   By: Jacqulynn Cadet M.D.   On: 09/08/2016 16:36    Procedures Procedures (including critical care time)  Medications Ordered in ED Medications  ketorolac (TORADOL) 30 MG/ML injection 30 mg (30 mg Intravenous Given 09/08/16 1617)   Initial Impression / Assessment and Plan / ED Course  I have reviewed the triage vital signs and the nursing notes.  Pertinent labs &  imaging results that were available during my care of the patient were reviewed by me and considered in my medical decision making (see chart for details).  Final Clinical Impressions(s) / ED Diagnoses  {I have reviewed and evaluated the relevant laboratory values. {I have reviewed and evaluated the relevant imaging studies.  {I have reviewed the relevant previous healthcare records.  {I obtained HPI from historian. {Patient discussed with supervising physician.  ED Course:  Assessment: Pt is a 71 y.o. female with hx GERD who presents with intermittent left flank pain x2 weeks. Improved at some point, but return last night. Aching sensation. Noted intermittent episodes of N/V. No fevers. No chills. No melena/hematochezia. . On exam, pt in NAD. Nontoxic/nonseptic appearing. VSS. Afebrile. Lungs CTA. Heart RRR. Abdomen nontender soft. CBC unremarkable. BMP unremarkable. UA with Hgb. NO infection. Possible nephrolithiasis. Pt takes calcium supplements CT Renal shows punctate non obstructing stone on left UVJ. Incidental 76m pulmonary nodule noted on RLL. Given analgesia in ED. Plan is to DC home with follow up to PCP. Given Rx #10 Percocet. [place] Seen by supervising physician. At time of discharge, Patient is in no acute distress. Vital Signs are stable. Patient is able to ambulate. Patient able to tolerate PO.    Disposition/Plan:  DC Home Additional Verbal discharge instructions given and discussed with patient.  Pt Instructed to f/u with PCP in the next week for evaluation and treatment of symptoms. Return precautions given Pt acknowledges and agrees with plan  Supervising Physician RSharlett Iles MD  Final diagnoses:  Nephrolithiasis    New Prescriptions New Prescriptions   No medications on file     TShary Decamp PA-C 09/08/16 [place] MD 09/08/16 [phone]

## 2016-09-11 DIAGNOSIS — N200 Calculus of kidney: Secondary | ICD-10-CM | POA: Diagnosis not present

## 2016-09-11 DIAGNOSIS — Q6211 Congenital occlusion of ureteropelvic junction: Secondary | ICD-10-CM | POA: Diagnosis not present

## 2016-09-13 DIAGNOSIS — H6123 Impacted cerumen, bilateral: Secondary | ICD-10-CM | POA: Diagnosis not present

## 2016-09-14 DIAGNOSIS — R1084 Generalized abdominal pain: Secondary | ICD-10-CM | POA: Diagnosis not present

## 2016-09-14 DIAGNOSIS — N201 Calculus of ureter: Secondary | ICD-10-CM | POA: Diagnosis not present

## 2016-09-29 DIAGNOSIS — N201 Calculus of ureter: Secondary | ICD-10-CM | POA: Diagnosis not present

## 2016-10-30 ENCOUNTER — Other Ambulatory Visit: Payer: Self-pay | Admitting: Internal Medicine

## 2016-10-30 DIAGNOSIS — R911 Solitary pulmonary nodule: Secondary | ICD-10-CM

## 2016-11-03 ENCOUNTER — Other Ambulatory Visit: Payer: Managed Care, Other (non HMO)

## 2016-11-09 ENCOUNTER — Ambulatory Visit
Admission: RE | Admit: 2016-11-09 | Discharge: 2016-11-09 | Disposition: A | Discharge status: Home / Self Care | Payer: Managed Care, Other (non HMO) | Source: Ambulatory Visit | Attending: Internal Medicine | Admitting: Internal Medicine

## 2016-11-09 ENCOUNTER — Other Ambulatory Visit: Payer: Managed Care, Other (non HMO)

## 2016-11-09 DIAGNOSIS — R911 Solitary pulmonary nodule: Secondary | ICD-10-CM

## 2016-11-20 ENCOUNTER — Telehealth: Payer: Self-pay

## 2016-11-21 NOTE — Telephone Encounter (Signed)
error 

## 2016-11-30 ENCOUNTER — Institutional Professional Consult (permissible substitution): Payer: Managed Care, Other (non HMO) | Admitting: Pulmonary Disease

## 2016-11-30 ENCOUNTER — Encounter: Payer: Self-pay | Admitting: Pulmonary Disease

## 2016-11-30 ENCOUNTER — Ambulatory Visit (INDEPENDENT_AMBULATORY_CARE_PROVIDER_SITE_OTHER): Payer: Managed Care, Other (non HMO) | Admitting: Pulmonary Disease

## 2016-11-30 ENCOUNTER — Other Ambulatory Visit: Payer: Managed Care, Other (non HMO)

## 2016-11-30 VITALS — BP 132/84 | HR 88 | Ht 70.0 in | Wt 228.2 lb

## 2016-11-30 DIAGNOSIS — R05 Cough: Secondary | ICD-10-CM

## 2016-11-30 DIAGNOSIS — K219 Gastro-esophageal reflux disease without esophagitis: Secondary | ICD-10-CM

## 2016-11-30 DIAGNOSIS — R059 Cough, unspecified: Secondary | ICD-10-CM

## 2016-11-30 DIAGNOSIS — J302 Other seasonal allergic rhinitis: Secondary | ICD-10-CM | POA: Diagnosis not present

## 2016-11-30 DIAGNOSIS — R918 Other nonspecific abnormal finding of lung field: Secondary | ICD-10-CM

## 2016-11-30 NOTE — Progress Notes (Signed)
Subjective:    Patient ID: Stephanie Crawford, female    DOB: 1945/07/09, 71 y.o.   MRN: [id number]  HPI The patient was seen in the ED and underwent imaging for nephrolithiasis. She was found to have lung nodules that prompted a CT scan of her chest and ultimately a referral to me. She denies any prior report of lung nodules but she also hasn't had any CT scans of her chest previously either. She denies any dyspnea out of proportion for her age. She reports a frequent, nonproductive cough that is worse depending on the season. She doesn't feel this is progressively worsening. Previously her cough was attributed to GERD. She was previously treated and reports it did seem to help the cough some at that time. She reports her cough is more in the morning when she wakes up. She reports the cough may be a bit worse since they have a new outside dog. She reports increased sinus congestion & drainage in February and early Spring where she will have post nasal drainage as well. She also notices symptoms in the Fall of the year. She has questionable audible wheezing. No fever, chills, or sweats. No weight loss. No chest pain, tightness or pressure. No dysphagia or odynophagia. No abdominal pain, nausea or emesis. She reports she has had reflux in the past. She reports morning brash water taste in the past but none recently. She does occasionally have dry mouth but no dry eyes or oral ulcers.   Review of Systems No rashes or abnormal bruising. She reports chronic pain in her hips & knees that she attributed to osteoporosis. No joint stiffness, swelling, or erythema. A pertinent 14 point review of systems is negative except as per the history of presenting illness.  No Known Allergies  Current Outpatient Prescriptions on File Prior to Visit  Medication Sig Dispense Refill  . calcium carbonate (OS-CAL) 600 MG TABS tablet Take 1,200 mg by mouth daily.    . cholecalciferol (VITAMIN D) 1000 UNITS tablet Take  2,000 Units by mouth daily.    Marland Kitchen ibuprofen (ADVIL,MOTRIN) 400 MG tablet Take 1 tablet (400 mg total) by mouth every 6 (six) hours as needed. (Patient not taking: Reported on 11/30/2016) 30 tablet 0  . oxyCODONE-acetaminophen (PERCOCET/ROXICET) 5-325 MG tablet Take 1 tablet by mouth every 6 (six) hours as needed for severe pain. (Patient not taking: Reported on 11/30/2016) 10 tablet 0   No current facility-administered medications on file prior to visit.     Past Medical History:  Diagnosis Date  . Allergic rhinitis   . Allergy   . Anemia    prior to hysterectomy--1997  . GERD (gastroesophageal reflux disease)   . Heart murmur   . Hemorrhoid 2011  . History of shingles 04/2014    Past Surgical History:  Procedure Laterality Date  . ABDOMINAL HYSTERECTOMY  1997  . APPENDECTOMY  1960  . HEMORRHOID SURGERY  9/11   8/27 had a follow up procedure.    Marland Kitchen LIPOMA EXCISION  1975   neck, left breast and righ jaw.    Family History  Problem Relation Age of Onset  . Prostate cancer Father   . Lung cancer Maternal Grandmother   . Asthma Daughter   . Asthma Sister   . Breast cancer Brother   . Heart attack Brother     Social History   Social History  . Marital status: Married    Spouse name: N/A  . Number of children: N/A  .  Years of education: N/A   Social History Main Topics  . Smoking status: Never Smoker  . Smokeless tobacco: Never Used  . Alcohol use No  . Drug use: No  . Sexual activity: Yes   Other Topics Concern  . None   Social History Narrative   [facility] (11/30/16):   Originally from [place], [place]. Grew up primarily in [place], [place]. She has also lived in [place]. She moved to [facility] in 2006. 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 Recently has acquired an outside dog. No bird exposure. Possible mold problem in her current home with  history of water damage. No hot tub exposure. Enjoys babysitting her grandsons. Remote travel to [place], [place], [place], [place], [place], [place], [place], [place], [place], & [place].       Objective:   Physical Exam BP 132/84 (BP Location: Left Arm, Patient Position: Sitting, Cuff Size: Normal)   Pulse 88   Ht 5\' 10"  (1.778 m)   Wt 228 lb 3.2 oz (103.5 kg)   LMP 05/23/1995   SpO2 100%   BMI 32.74 kg/m  General:  Awake. Alert. No  distress. Mild central obesity. Integument:  Warm & dry. No rash on exposed skin. No bruising on exposed skin. Extremities:  No cyanosis or clubbing.  Lymphatics:  No appreciated cervical or supraclavicular lymphadenoapthy. HEENT:  Moist mucus membranes. No oral ulcers. No scleral injection or icterus. Minimal nasal turbinate swelling. Cardiovascular:  Regular rate. No edema. Normal S1 & S2.  Pulmonary:  Good aeration & clear to auscultation bilaterally. Symmetric chest wall expansion. No accessory muscle use on room air. Abdomen: Soft. Normal bowel sounds. Mildly protuberant. Musculoskeletal:  Normal bulk and tone. Hand grip strength 5/5 bilaterally. No joint deformity or effusion appreciated. Neurological:  CN 2-12 grossly in tact. No meningismus. Moving all 4 extremities equally. Symmetric brachioradialis deep tendon reflexes. Psychiatric:  Mood and affect congruent. Speech normal rhythm, rate & tone.   IMAGING CT CHEST W/O 11/09/16 (personally reviewed by me):  Two nodular opacities are present within right lower lobe measuring 1.6 centimeters in maximal dimension. The nodule that is adjacent the diaphragm appears relatively unchanged in size when compared with CT imaging of the abdomen/pelvis in April. Other subcentimeter nodules noted in bilateral lower lungs. Borderline mediastinal lymphadenopathy. No pleural effusion or thickening. No pericardial effusion.    Assessment & Plan:  71 y.o. female with long-standing history of cough as well as  chronic seasonal allergic rhinitis. I do question whether or not her cough could be related to underlying asthma. Alternatively, he could simply be due to her allergic rhinitis. Interestingly, she has had reflux in the past and seemed to have some improvement in her cough by her recollection with treatment of reflux. As such, I'm going to investigate her cough, allergies, and reflux more closely. We had a lengthy discussion with regards to her chest CT scan as well as the potential investigational options including PET/CT imaging versus repeat CT imaging of the chest. We did discuss the possibility, albeit low, that this represents malignancy. There has been no discernible increase in size of the nodule adjacent the diaphragm and it's not clear to me whether or not the other sizable nodule within her right lower lobe was captured from her previous abdominal CT scan. Even so, the patient wishes to undergo repeat CT imaging as per radiology recommendations and will contact me  if she changes her mind. I instructed the patient to contact me if she had any further questions or concerns before her next appointment.  1. Multiple lung nodules on CT:  Ordering CT chest without contrast in September 2018. Further imaging pending this result. Patient will contact me if she desires to undergo PET/CT imaging. 2. Cough: Question possible underlying asthma. Checking full pulmonary function testing before next appointment. 3. Chronic seasonal allergic rhinitis: Checking serum RAST panel. 4. GERD: Holding on medications at this time. Checking esophagogram/barium swallow. 5. Follow-up: Return to clinic in 2 months or sooner if needed.  Sonia Baller Ashok Cordia, M.D. [facility] Pager:  [phone] After 3pm or if no response, call [phone] 4:53 PM 11/30/16

## 2016-11-30 NOTE — Patient Instructions (Signed)
   Call or email me if you have any new breathing problems or questions before your next appointment.  We will review your test results as they become available but will discuss them further at your follow-up appointment.  As we discussed today I am ordering a chest CT Scan in September but if you decide you want to do a PET CT scan to look for cancer sooner then just let me know.  TESTS ORDERED: 1. Full PFTs before next appointment 2. Serum RAST Panel Today 3. CT Chest w/o September 2018 4. Esophagram/Barium Swallow

## 2016-12-01 LAB — RESPIRATORY ALLERGY PROFILE REGION II ~~LOC~~
Allergen, A. alternata, m6: <0.1 kU/L
Allergen, C. Herbarum, M2: <0.1 kU/L
Allergen, Cedar tree, t12: <0.1 kU/L
Allergen, Comm Silver Birch, t9: 1.08 kU/L — ABNORMAL HIGH
Allergen, Mouse Urine Protein, e78: <0.1 kU/L
Allergen, Oak,t7: <0.1 kU/L
COMMON RAGWEED: 0.11 kU/L — AB
Cat Dander: <0.1 kU/L
IgE (Immunoglobulin E), Serum: 43 kU/L (ref <115)
Johnson Grass: <0.1 kU/L
Pecan/Hickory Tree IgE: <0.1 kU/L
Rough Pigweed  IgE: <0.1 kU/L
Timothy Grass: <0.1 kU/L

## 2016-12-04 ENCOUNTER — Other Ambulatory Visit: Payer: Self-pay | Admitting: Internal Medicine

## 2016-12-04 DIAGNOSIS — Z0000 Encounter for general adult medical examination without abnormal findings: Secondary | ICD-10-CM | POA: Diagnosis not present

## 2016-12-04 DIAGNOSIS — N200 Calculus of kidney: Secondary | ICD-10-CM | POA: Diagnosis not present

## 2016-12-04 DIAGNOSIS — E7800 Pure hypercholesterolemia, unspecified: Secondary | ICD-10-CM | POA: Diagnosis not present

## 2016-12-04 DIAGNOSIS — Z1231 Encounter for screening mammogram for malignant neoplasm of breast: Secondary | ICD-10-CM

## 2016-12-04 DIAGNOSIS — N6001 Solitary cyst of right breast: Secondary | ICD-10-CM | POA: Diagnosis not present

## 2016-12-04 DIAGNOSIS — E559 Vitamin D deficiency, unspecified: Secondary | ICD-10-CM | POA: Diagnosis not present

## 2016-12-14 ENCOUNTER — Ambulatory Visit
Admission: RE | Admit: 2016-12-14 | Discharge: 2016-12-14 | Disposition: A | Discharge status: Home / Self Care | Payer: Managed Care, Other (non HMO) | Source: Ambulatory Visit | Attending: Internal Medicine | Admitting: Internal Medicine

## 2016-12-14 DIAGNOSIS — Z1231 Encounter for screening mammogram for malignant neoplasm of breast: Secondary | ICD-10-CM

## 2016-12-27 ENCOUNTER — Telehealth: Payer: Self-pay | Admitting: Pulmonary Disease

## 2016-12-27 DIAGNOSIS — K219 Gastro-esophageal reflux disease without esophagitis: Secondary | ICD-10-CM

## 2016-12-27 NOTE — Telephone Encounter (Signed)
Attempted to call the number but it was the number to the ED department. The order has been changed. WIll close this message.

## 2016-12-28 ENCOUNTER — Ambulatory Visit (HOSPITAL_COMMUNITY)
Admission: RE | Admit: 2016-12-28 | Discharge: 2016-12-28 | Disposition: A | Discharge status: Home / Self Care | Payer: Managed Care, Other (non HMO) | Source: Ambulatory Visit | Attending: Pulmonary Disease | Admitting: Pulmonary Disease

## 2016-12-28 ENCOUNTER — Encounter (HOSPITAL_COMMUNITY): Payer: Self-pay

## 2016-12-28 DIAGNOSIS — K219 Gastro-esophageal reflux disease without esophagitis: Secondary | ICD-10-CM | POA: Insufficient documentation

## 2017-01-02 ENCOUNTER — Telehealth: Payer: Self-pay | Admitting: Pulmonary Disease

## 2017-01-02 NOTE — Telephone Encounter (Signed)
Notes recorded by Javier Glazier, MD on 01/01/2017 at 2:20 PM EDT Please let the patient know that her study was normal and there is no evidence of any hiatal hernia or reflux.  Thank you.  Left detailed message for patient.

## 2017-01-10 ENCOUNTER — Telehealth: Payer: Self-pay

## 2017-01-10 NOTE — Telephone Encounter (Signed)
Received a prior authorization request for Diclofenac gel form [employer] renewal department. Reviewed patient's chart to find that she hasn't been seen since 12/2015. Patient will need to be seen before an authorization can be attempted.   Called patient to see if she was still using and medication and wanted to make an appointment. Patient did not answer and I could not leave a message.   Will delete the request until we hear back.  Rastus Borton, [place], CPhT  2:31 PM

## 2017-01-25 ENCOUNTER — Other Ambulatory Visit (HOSPITAL_BASED_OUTPATIENT_CLINIC_OR_DEPARTMENT_OTHER): Payer: Managed Care, Other (non HMO)

## 2017-02-05 ENCOUNTER — Ambulatory Visit (HOSPITAL_BASED_OUTPATIENT_CLINIC_OR_DEPARTMENT_OTHER)
Admission: RE | Admit: 2017-02-05 | Discharge: 2017-02-05 | Disposition: A | Discharge status: Home / Self Care | Payer: Managed Care, Other (non HMO) | Source: Ambulatory Visit | Attending: Pulmonary Disease | Admitting: Pulmonary Disease

## 2017-02-05 DIAGNOSIS — R918 Other nonspecific abnormal finding of lung field: Secondary | ICD-10-CM | POA: Diagnosis not present

## 2017-02-05 DIAGNOSIS — I700 Atherosclerosis of aorta: Secondary | ICD-10-CM | POA: Insufficient documentation

## 2017-02-09 ENCOUNTER — Ambulatory Visit (INDEPENDENT_AMBULATORY_CARE_PROVIDER_SITE_OTHER): Payer: Managed Care, Other (non HMO) | Admitting: Pulmonary Disease

## 2017-02-09 ENCOUNTER — Ambulatory Visit: Payer: Managed Care, Other (non HMO) | Admitting: Pulmonary Disease

## 2017-02-09 ENCOUNTER — Encounter: Payer: Self-pay | Admitting: Pulmonary Disease

## 2017-02-09 VITALS — BP 140/78 | HR 68 | Ht 70.0 in | Wt 228.0 lb

## 2017-02-09 DIAGNOSIS — J302 Other seasonal allergic rhinitis: Secondary | ICD-10-CM | POA: Diagnosis not present

## 2017-02-09 DIAGNOSIS — R918 Other nonspecific abnormal finding of lung field: Secondary | ICD-10-CM

## 2017-02-09 DIAGNOSIS — R059 Cough, unspecified: Secondary | ICD-10-CM

## 2017-02-09 DIAGNOSIS — K219 Gastro-esophageal reflux disease without esophagitis: Secondary | ICD-10-CM

## 2017-02-09 DIAGNOSIS — R05 Cough: Secondary | ICD-10-CM | POA: Diagnosis not present

## 2017-02-09 DIAGNOSIS — R0602 Shortness of breath: Secondary | ICD-10-CM

## 2017-02-09 LAB — PULMONARY FUNCTION TEST
DL/VA % pred: 100 %
DL/VA: 5.48 ml/min/mmHg/L
DLCO COR % PRED: 80 %
DLCO UNC: 26.02 ml/min/mmHg
DLCO cor: 26.02 ml/min/mmHg
DLCO unc % pred: 80 %
FEF 25-75 PRE: 3.02 L/s
FEF2575-%PRED-PRE: 135 %
FEV1-%Pred-Pre: 84 %
FEV1-Pre: 2.41 L
FEV1FVC-%PRED-PRE: 110 %
FEV6-%PRED-PRE: 80 %
FEV6-PRE: 2.87 L
FEV6FVC-%Pred-Pre: 104 %
FVC-%Pred-Pre: 76 %
FVC-PRE: 2.87 L
PRE FEV1/FVC RATIO: 84 %
PRE FEV6/FVC RATIO: 100 %

## 2017-02-09 MED ORDER — MONTELUKAST SODIUM 10 MG PO TABS: 10.0000 mg | ORAL_TABLET | Freq: Every day | ORAL | 6 refills | Status: DC

## 2017-02-09 NOTE — Patient Instructions (Signed)
   Consider having your home tested for mold.  Please let us know if you have any problems with or feel your cough is not improved on Singulair.  We will see you back in 6 months after your CT scan.  Please call if you have any new breathing problems or questions.  TESTS ORDERED: 1. CT CHEST W/O March 2019

## 2017-02-09 NOTE — Addendum Note (Signed)
Addended by: Della Goo C on: 02/09/2017 12:13 PM   Modules accepted: Orders

## 2017-02-09 NOTE — Progress Notes (Signed)
Subjective:    Patient ID: Stephanie Crawford, female    DOB: Jun 07, 1945, 71 y.o.   MRN: [id number]  C.C.:  Follow-up for Multiple Lung Nodules, Cough, Restrictive Lung Disease, Chronic Seasonal Allergic Rhinitis, & GERD.  HPI Multiple Lung Nodules: Identified on CT imaging in June 2018. Largest nodular opacity is within right lower lobe measuring 1.6 cm. No evidence of progression on CT scan performed this month.  Cough:  She does feel like her cough is getter worse. She denies any associated wheezing. She reports she does have some dyspnea on exertion that is unchanged. Other than a seasonal variation to her cough she does seem to cough more in the morning. Her cough does seem to improve some mid-day. She does feel she is more sensitive to dust and recently changed her house filters. She doesn't seem to cough when she is away and on vacation.   Mild restrictive lung disease: Seen on lung volumes performed today. No suggestion of/evidence of interstitial lung disease on chest CT imaging. Suspect this is due to central obesity.  Chronic Seasonal Allergic Rhinitis: No significant IgE elevation. Some RAST panel reactivity to ragweed and Silver Wendee Copp. She denies any sinus congestion or drainage recently.   GERD: Not seen on esophagogram/barium swallow. Not currently on medication. She denies any reflux or dyspepsia recently.   Review of Systems No chest pain or pressure. No fever or chills. No abdominal pain or nausea.   No Known Allergies  Current Outpatient Prescriptions on File Prior to Visit  Medication Sig Dispense Refill  . calcium carbonate (OS-CAL) 600 MG TABS tablet Take 1,200 mg by mouth daily.    . cholecalciferol (VITAMIN D) 1000 UNITS tablet Take 2,000 Units by mouth daily.     No current facility-administered medications on file prior to visit.     Past Medical History:  Diagnosis Date  . Allergic rhinitis   . Allergy   . Anemia    prior to hysterectomy--1997  . GERD  (gastroesophageal reflux disease)   . Heart murmur   . Hemorrhoid 2011  . History of shingles 04/2014    Past Surgical History:  Procedure Laterality Date  . ABDOMINAL HYSTERECTOMY  1997  . APPENDECTOMY  1960  . HEMORRHOID SURGERY  9/11   8/27 had a follow up procedure.    Marland Kitchen LIPOMA EXCISION  1975   neck, left breast and righ jaw.    Family History  Problem Relation Age of Onset  . Prostate cancer Father   . Lung cancer Maternal Grandmother   . Asthma Daughter   . Asthma Sister   . Breast cancer Brother   . Heart attack Brother     Social History   Social History  . Marital status: Married    Spouse name: N/A  . Number of children: N/A  . Years of education: N/A   Social History Main Topics  . Smoking status: Never Smoker  . Smokeless tobacco: Never Used  . Alcohol use No  . Drug use: No  . Sexual activity: Yes   Other Topics Concern  . None   Social History Narrative   [facility] (11/30/16):   Originally from [place], [place]. Grew up primarily in [place], [place]. She has also lived in [place]. She moved to [place] in 2006. 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 Recently has acquired an outside dog. No bird exposure.  Possible mold problem in her current home with history of water damage. No hot tub exposure. Enjoys babysitting her grandsons. Remote travel to [place], [place], [place], [place], [place], [place], [place], [place], [place], & [place].       Objective:   Physical Exam BP 140/78 (BP Location: Right Arm, Cuff Size: Normal)   Pulse 68   Ht 5\' 10"  (1.778 m)   Wt 228 lb (103.4 kg)   LMP 05/23/1995   SpO2 96%   BMI 32.71 kg/m   General:  Awake. Mild central obesity. No distress. Integument:  Warm & dry. No rash on exposed skin. No bruising on exposed skin. Extremities:  No cyanosis or clubbing.  HEENT:  Moderate  bilateral nasal turbinate swelling. No oral ulcers. Moist mucous membranes Cardiovascular:  Regular rate. No edema. Regular rhythm.  Pulmonary:  Clear bilaterally with auscultation. Normal work of breathing on room air. Abdomen: Soft. Normal bowel sounds. Protuberant. Musculoskeletal:  Normal bulk and tone. No joint deformity or effusion appreciated.  PFT 02/09/17: FVC 2.87 L (76%) FEV1 2.41 L (84%) FEV1/FVC 0.84 FEF 25-75 3.02 L (135%) TLC 4.23 L (71%) RV 67% ERV 39% DLCO corrected 80% (unable to perform post-spirometry bronchodilator challenge due to coughing)  IMAGING CT CHEST W/O 02/05/17 (personally reviewed by me):  No signs of progression in her semisolid nodular opacity measuring at most 1.6 cm in the right lung base. Other scattered subcentimeter nodules unchanged. No evidence of developing mass. No pleural effusion or thickening. No pericardial effusion. No pathologic mediastinal adenopathy.  ESOPHAGRAM/BARMIUM SWALLOW 12/28/16 (per radiologist):  Normal esophagram.  CT CHEST W/O 11/09/16 (previously reviewed by me):  Two nodular opacities are present within right lower lobe measuring 1.6 centimeters in maximal dimension. The nodule that is adjacent the diaphragm appears relatively unchanged in size when compared with CT imaging of the abdomen/pelvis in April. Other subcentimeter nodules noted in bilateral lower lungs. Borderline mediastinal lymphadenopathy. No pleural effusion or thickening. No pericardial effusion.  LABS 11/30/16 IgE:  43 RAST Panel:  Common Silver Wendee Copp 1.08 & Common Ragweed 0.11    Assessment & Plan:  71 y.o. female with long-standing history of a cough. I reviewed her chest CT scan with her today. There is no evidence of any progression in her lung nodules. Given her family history of malignancy I believe repeating a CT chest without contrast before 12 months is reasonable given her increased risk of cancer. With her reported resolution of cough while on vacation and  away from home I do question whether or not there could be some mold within her home it did have water damage. I recommended testing her home for mold. Alternatively, she could simply have underlying asthma. We discussed potential treatment options today and the patient is most comfortable with starting Singulair. I instructed her to contact my office if she had any new breathing problems or questions before her next appointment.  1. Multiple lung nodules:  Planning for repeat CT chest without contrast in March 2019. 2. Cough:  Recommended testing hold for mold. Empiric treatment with Singulair 10 mg by mouth daily at bedtime. 3. Restrictive lung disease: Suspect secondary to central obesity. Deferring further workup. 4. Chronic seasonal allergic rhinitis: Starting patient on Singulair 10 mg by mouth daily at bedtime. 5. GERD: Asymptomatic. No new medications. 6. Health maintenance: Status post Tdap August 2014. Declines vaccinations today. 7. Follow-up: Return to clinic in 6 months or sooner if needed.  Sonia Baller Ashok Cordia, M.D. [facility]  Pager:  [phone] After 3pm or if no response, call [phone] 11:37 AM 02/09/17

## 2017-02-09 NOTE — Progress Notes (Signed)
PFT done today. 

## 2017-04-24 ENCOUNTER — Telehealth: Payer: Self-pay | Admitting: Rheumatology

## 2017-04-24 NOTE — Telephone Encounter (Signed)
Patient needs a refill on Voltaren gel. Patient uses [facility] in [place]. Patient has a rov on 12/31.

## 2017-04-24 NOTE — Telephone Encounter (Signed)
Patient requesting a refill on Voltaren gel. Patient treated for left hip pain greater than right hip pain, OA of the hands and osteoporosis in our office. Patient has not been seen in our office since August 2017. She does not have a follow up scheduled. Do you want to refill the Voltaren Gel?

## 2017-04-24 NOTE — Telephone Encounter (Signed)
Patient has an appointment scheduled for 05/21/17. Patient offered sooner appointment and will call back if she is able to come in.

## 2017-04-24 NOTE — Telephone Encounter (Signed)
Need appointment for further refills

## 2017-05-10 NOTE — Progress Notes (Signed)
Office Visit Note  Patient: Stephanie Crawford             Date of Birth: [birth year]           MRN: [id number]             PCP: Lanice Shirts, MD Referring: Lanice Shirts, * Visit Date: 05/17/2017 Occupation: @GUAROCC @    Subjective:  Pain in the right knee.   History of Present Illness: Stephanie Crawford is a 71 y.o. female with history of osteoarthritis, disc disease and osteoporosis. She returns today after her last visit August 2017. She states she's been having some discomfort in her right knee lower and upper back. She continues to have some discomfort in her bilateral trochanteric area. She uses Voltaren gel which is been helpful. She denies any joint swelling. She has been taking calcium and vitamin D and also doing some resistive exercises.  Activities of Daily Living:  Patient reports morning stiffness for 0 minute.   Patient Denies nocturnal pain.  Difficulty dressing/grooming: Denies Difficulty climbing stairs: Denies Difficulty getting out of chair: Denies Difficulty using hands for taps, buttons, cutlery, and/or writing: Denies   Review of Systems  Constitutional: Positive for fatigue. Negative for night sweats, weight gain, weight loss and weakness.  HENT: Negative for mouth sores, trouble swallowing, trouble swallowing, mouth dryness and nose dryness.   Eyes: Negative.  Negative for pain, redness, visual disturbance and dryness.  Respiratory: Negative for cough, shortness of breath and difficulty breathing.   Cardiovascular: Negative for chest pain, palpitations, hypertension, irregular heartbeat and swelling in legs/feet.  Gastrointestinal: Negative.  Negative for blood in stool, constipation and diarrhea.  Endocrine: Negative for increased urination.  Genitourinary: Negative for vaginal dryness.  Musculoskeletal: Positive for arthralgias, joint pain and morning stiffness. Negative for joint swelling, myalgias, muscle weakness, muscle  tenderness and myalgias.  Skin: Negative.  Negative for color change, rash, hair loss, skin tightness, ulcers and sensitivity to sunlight.  Allergic/Immunologic: Negative for susceptible to infections.  Neurological: Negative for dizziness, numbness, headaches, memory loss and night sweats.  Hematological: Negative for swollen glands.  Psychiatric/Behavioral: Positive for sleep disturbance. Negative for depressed mood. The patient is not nervous/anxious.     PMFS History:  Patient Active Problem List   Diagnosis Date Noted  . Primary osteoarthritis of both knees 05/17/2017  . Primary osteoarthritis of both hands 05/17/2017  . DDD (degenerative disc disease), lumbar 05/17/2017  . History of gastroesophageal reflux (GERD) 05/17/2017  . History of shingles 05/17/2017  . History of cellulitis 05/17/2017  . Multiple lung nodules on CT 11/30/2016  . Cough 11/30/2016  . Right knee pain 04/28/2015  . Foot ulcer ([place]) 07/15/2014  . Osteoporosis 01/30/2013  . Vitamin D deficiency 01/13/2013  . Other and unspecified hyperlipidemia 01/06/2013  . Breast thickening 01/06/2013  . Skin lesion of right leg 09/19/2012  . Chronic seasonal allergic rhinitis 09/19/2012  . Elevated bilirubin 07/12/2012  . History of cardiac murmur 07/12/2012  . Rash and nonspecific skin eruption 07/11/2012  . Rectal bleeding 05/11/2011  . Abnormal EKG 03/01/2011  . General medical examination 03/01/2011  . GERD 12/08/2009  . ANKLE EDEMA 12/08/2009    Past Medical History:  Diagnosis Date  . Allergic rhinitis   . Allergy   . Anemia    prior to hysterectomy--1997  . GERD (gastroesophageal reflux disease)   . Heart murmur   . Hemorrhoid 2011  . History of shingles 04/2014    Family History  Problem Relation Age of Onset  . Prostate cancer Father   . Lung cancer Maternal Grandmother   . Asthma Daughter   . Asthma Sister   . Breast cancer Brother   . Heart attack Brother    Past Surgical History:    Procedure Laterality Date  . ABDOMINAL HYSTERECTOMY  1997  . APPENDECTOMY  1960  . HEMORRHOID SURGERY  9/11   8/27 had a follow up procedure.    Marland Kitchen LIPOMA EXCISION  1975   neck, left breast and righ jaw.   Social History   Social History Narrative   [facility] (11/30/16):   Originally from [place], [place]. Grew up primarily in [place], [place]. She has also lived in [place]. She moved to [facility] in 2006. 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 Recently has acquired an outside dog. No bird exposure. Possible mold problem in her current home with history of water damage. No hot tub exposure. Enjoys babysitting her grandsons. Remote travel to [place], [place], [place], [place], [place], [place], [place], [place], [place], & [place].      Objective: Vital Signs: BP 121/74 (BP Location: Left Arm, Patient Position: Sitting, Cuff Size: Normal)   Pulse 75   Ht 5\' 9"  (1.753 m)   Wt 238 lb (108 kg)   LMP 05/23/1995   BMI 35.15 kg/m    Physical Exam  Constitutional: She is oriented to person, place, and time. She appears well-developed and well-nourished.  HENT:  Head: Normocephalic and atraumatic.  Eyes: Conjunctivae and EOM are normal.  Neck: Normal range of motion.  Cardiovascular: Normal rate, regular rhythm and intact distal pulses.  Murmur heard. Pulmonary/Chest: Effort normal and breath sounds normal.  Abdominal: Soft. Bowel sounds are normal.  Lymphadenopathy:    She has no cervical adenopathy.  Neurological: She is alert and oriented to person, place, and time.  Skin: Skin is warm and dry. Capillary refill takes less than 2 seconds.  Psychiatric: She has a normal mood and affect. Her behavior is normal.  Nursing note and vitals reviewed.    Musculoskeletal Exam: C-spine and thoracic lumbar spine good range of motion. She is some discomfort range  of motion of her lumbar spine. Shoulder joints elbow joints wrist joints are good range of motion. She had DIP PIP thickening in her hands consistent with osteoarthritis. Hip joints knee joints ankles MTPs PIPs with good range of motion. She is tenderness over bilateral trochanteric bursa consistent with trochanteric bursitis.  CDAI Exam: No CDAI exam completed.    Investigation: No additional findings.   Imaging: No results found.  Speciality Comments: No specialty comments available.    Procedures:  No procedures performed Allergies: Patient has no known allergies.   Assessment / Plan:     Visit Diagnoses: Age-related osteoporosis without current pathological fracture - DXA 02/03/2015 T -2.6, BMD 0.875 - she has been taking calcium and vitamin D. She did not want to take any bisphosphonates or other medications for osteoporosis. I will repeat her bone density as it has been more than 2 years. Once we have results available we can discuss this further. Plan: DG DXA FRACTURE ASSESSMENT  History of vitamin D deficiency - last vitamin D level in was normal. 04/2015 Vit D 40  Trochanteric bursitis of both hips: She continues to have some discomfort in trochanteric area. ITB and exercise were discussed and handout was given.  Primary  osteoarthritis of both knees: She has chronic pain in her bilateral knee joints. Weight loss diet and exercise was discussed. She has tried Voltaren gel in the past which she has good response to. Per request prescription refill was given and side effects were revised.  Primary osteoarthritis of both hands: She has a stiffness in her hands but no swelling. She had fracture of her right wrist in the past.  DDD (degenerative disc disease), lumbar: Chronic pain  History of gastroesophageal reflux (GERD)  History of cardiac murmur  History of shingles - 2015  History of cellulitis - right foot, MRSA 2016    Orders: Orders Placed This Encounter    Procedures  . DG DXA FRACTURE ASSESSMENT   Meds ordered this encounter  Medications  . diclofenac sodium (VOLTAREN) 1 % GEL    Sig: Apply 4 g topically 4 (four) times daily.    Dispense:  5 Tube    Refill:  1    Face-to-face time spent with patient was 30 minutes. Greater than 50% of time was spent in counseling and coordination of care.  Follow-Up Instructions: Return in about 1 year (around 05/17/2018) for Osteoarthritis,DDD, OP.   Bo Merino, MD  Note - This record has been created using Editor, commissioning.  Chart creation errors have been sought, but may not always  have been located. Such creation errors do not reflect on  the standard of medical care.

## 2017-05-17 ENCOUNTER — Encounter: Payer: Self-pay | Admitting: Rheumatology

## 2017-05-17 ENCOUNTER — Ambulatory Visit (INDEPENDENT_AMBULATORY_CARE_PROVIDER_SITE_OTHER): Payer: Managed Care, Other (non HMO) | Admitting: Rheumatology

## 2017-05-17 VITALS — BP 121/74 | HR 75 | Ht 69.0 in | Wt 238.0 lb

## 2017-05-17 DIAGNOSIS — M19042 Primary osteoarthritis, left hand: Secondary | ICD-10-CM

## 2017-05-17 DIAGNOSIS — Z8719 Personal history of other diseases of the digestive system: Secondary | ICD-10-CM | POA: Insufficient documentation

## 2017-05-17 DIAGNOSIS — M51369 Other intervertebral disc degeneration, lumbar region without mention of lumbar back pain or lower extremity pain: Secondary | ICD-10-CM

## 2017-05-17 DIAGNOSIS — Z8679 Personal history of other diseases of the circulatory system: Secondary | ICD-10-CM

## 2017-05-17 DIAGNOSIS — M810 Age-related osteoporosis without current pathological fracture: Secondary | ICD-10-CM

## 2017-05-17 DIAGNOSIS — M19041 Primary osteoarthritis, right hand: Secondary | ICD-10-CM | POA: Diagnosis not present

## 2017-05-17 DIAGNOSIS — M170 Bilateral primary osteoarthritis of knee: Secondary | ICD-10-CM

## 2017-05-17 DIAGNOSIS — M5136 Other intervertebral disc degeneration, lumbar region: Secondary | ICD-10-CM | POA: Diagnosis not present

## 2017-05-17 DIAGNOSIS — Z872 Personal history of diseases of the skin and subcutaneous tissue: Secondary | ICD-10-CM | POA: Diagnosis not present

## 2017-05-17 DIAGNOSIS — Z8639 Personal history of other endocrine, nutritional and metabolic disease: Secondary | ICD-10-CM

## 2017-05-17 DIAGNOSIS — M7062 Trochanteric bursitis, left hip: Secondary | ICD-10-CM | POA: Diagnosis not present

## 2017-05-17 DIAGNOSIS — Z8619 Personal history of other infectious and parasitic diseases: Secondary | ICD-10-CM | POA: Diagnosis not present

## 2017-05-17 DIAGNOSIS — M7061 Trochanteric bursitis, right hip: Secondary | ICD-10-CM

## 2017-05-17 MED ORDER — DICLOFENAC SODIUM 1 % TD GEL: 4.0000 g | Freq: Four times a day (QID) | TRANSDERMAL | 1 refills | Status: DC

## 2017-05-17 NOTE — Patient Instructions (Addendum)
Please get CBC with differential, CMP with GFR, vitamin D level with your PCP. Back Exercises The following exercises strengthen the muscles that help to support the back. They also help to keep the lower back flexible. Doing these exercises can help to prevent back pain or lessen existing pain. If you have back pain or discomfort, try doing these exercises 2-3 times each day or as told by your health care provider. When the pain goes away, do them once each day, but increase the number of times that you repeat the steps for each exercise (do more repetitions). If you do not have back pain or discomfort, do these exercises once each day or as told by your health care provider. Exercises Single Knee to Chest  Repeat these steps 3-5 times for each leg: 1. Lie on your back on a firm bed or the floor with your legs extended. 2. Bring one knee to your chest. Your other leg should stay extended and in contact with the floor. 3. Hold your knee in place by grabbing your knee or thigh. 4. Pull on your knee until you feel a gentle stretch in your lower back. 5. Hold the stretch for 10-30 seconds. 6. Slowly release and straighten your leg.  Pelvic Tilt  Repeat these steps 5-10 times: 1. Lie on your back on a firm bed or the floor with your legs extended. 2. Bend your knees so they are pointing toward the ceiling and your feet are flat on the floor. 3. Tighten your lower abdominal muscles to press your lower back against the floor. This motion will tilt your pelvis so your tailbone points up toward the ceiling instead of pointing to your feet or the floor. 4. With gentle tension and even breathing, hold this position for 5-10 seconds.  Cat-Cow  Repeat these steps until your lower back becomes more flexible: 1. Get into a hands-and-knees position on a firm surface. Keep your hands under your shoulders, and keep your knees under your hips. You may place padding under your knees for comfort. 2. Let your  head hang down, and point your tailbone toward the floor so your lower back becomes rounded like the back of a cat. 3. Hold this position for 5 seconds. 4. Slowly lift your head and point your tailbone up toward the ceiling so your back forms a sagging arch like the back of a cow. 5. Hold this position for 5 seconds.  Press-Ups  Repeat these steps 5-10 times: 1. Lie on your abdomen (face-down) on the floor. 2. Place your palms near your head, about shoulder-width apart. 3. While you keep your back as relaxed as possible and keep your hips on the floor, slowly straighten your arms to raise the top half of your body and lift your shoulders. Do not use your back muscles to raise your upper torso. You may adjust the placement of your hands to make yourself more comfortable. 4. Hold this position for 5 seconds while you keep your back relaxed. 5. Slowly return to lying flat on the floor.  Bridges  Repeat these steps 10 times: 1. Lie on your back on a firm surface. 2. Bend your knees so they are pointing toward the ceiling and your feet are flat on the floor. 3. Tighten your buttocks muscles and lift your buttocks off of the floor until your waist is at almost the same height as your knees. You should feel the muscles working in your buttocks and the back of your thighs.  If you do not feel these muscles, slide your feet 1-2 inches farther away from your buttocks. 4. Hold this position for 3-5 seconds. 5. Slowly lower your hips to the starting position, and allow your buttocks muscles to relax completely.  If this exercise is too easy, try doing it with your arms crossed over your chest. Abdominal Crunches  Repeat these steps 5-10 times: 1. Lie on your back on a firm bed or the floor with your legs extended. 2. Bend your knees so they are pointing toward the ceiling and your feet are flat on the floor. 3. Cross your arms over your chest. 4. Tip your chin slightly toward your chest without  bending your neck. 5. Tighten your abdominal muscles and slowly raise your trunk (torso) high enough to lift your shoulder blades a tiny bit off of the floor. Avoid raising your torso higher than that, because it can put too much stress on your low back and it does not help to strengthen your abdominal muscles. 6. Slowly return to your starting position.  Back Lifts Repeat these steps 5-10 times: 1. Lie on your abdomen (face-down) with your arms at your sides, and rest your forehead on the floor. 2. Tighten the muscles in your legs and your buttocks. 3. Slowly lift your chest off of the floor while you keep your hips pressed to the floor. Keep the back of your head in line with the curve in your back. Your eyes should be looking at the floor. 4. Hold this position for 3-5 seconds. 5. Slowly return to your starting position.  Contact a health care provider if:  Your back pain or discomfort gets much worse when you do an exercise.  Your back pain or discomfort does not lessen within 2 hours after you exercise. If you have any of these problems, stop doing these exercises right away. Do not do them again unless your health care provider says that you can. Get help right away if:  You develop sudden, severe back pain. If this happens, stop doing the exercises right away. Do not do them again unless your health care provider says that you can. This information is not intended to replace advice given to you by your health care provider. Make sure you discuss any questions you have with your health care provider. Document Released: 06/15/2004 Document Revised: 09/15/2015 Document Reviewed: 07/02/2014 Elsevier Interactive Patient Education  2017 [place] [facility] Ask your health care provider which exercises are safe for you. Do exercises exactly as told by your health care provider and adjust them as directed. It is normal to feel mild stretching, pulling, tightness,  or discomfort as you do these exercises, but you should stop right away if you feel sudden pain or your pain gets worse.Do not begin these exercises until told by your health care provider. Stretching and range of motion exercises These exercises warm up your muscles and joints and improve the movement and flexibility of your hip and pelvis. Exercise A: Quadriceps, prone  1. Lie on your abdomen on a firm surface, such as a bed or padded floor. 2. Bend your left / right knee and hold your ankle. If you cannot reach your ankle or pant leg, loop a belt around your foot and grab the belt instead. 3. Gently pull your heel toward your buttocks. Your knee should not slide out to the side. You should feel a stretch in the front of your thigh and knee. 4. Hold  this position for __________ seconds. Repeat __________ times. Complete this stretch __________ times a day. Exercise B: Iliotibial band  1. Lie on your side with your left / right leg in the top position. 2. Bend both of your knees and grab your left / right ankle. Stretch out your bottom arm to help you balance. 3. Slowly bring your top knee back so your thigh goes behind your trunk. 4. Slowly lower your top leg toward the floor until you feel a gentle stretch on the outside of your left / right hip and thigh. If you do not feel a stretch and your knee will not fall farther, place the heel of your other foot on top of your knee and pull your knee down toward the floor with your foot. 5. Hold this position for __________ seconds. Repeat __________ times. Complete this stretch __________ times a day. Strengthening exercises These exercises build strength and endurance in your hip and pelvis. Endurance is the ability to use your muscles for a long time, even after they get tired. Exercise C: Straight leg raises ( hip abductors) 1. Lie on your side with your left / right leg in the top position. Lie so your head, shoulder, knee, and hip line up.  You may bend your bottom knee to help you balance. 2. Roll your hips slightly forward so your hips are stacked directly over each other and your left / right knee is facing forward. 3. Tense the muscles in your outer thigh and lift your top leg 4-6 inches (10-15 cm). 4. Hold this position for __________ seconds. 5. Slowly return to the starting position. Let your muscles relax completely before doing another repetition. Repeat __________ times. Complete this exercise __________ times a day. Exercise D: Straight leg raises ( hip extensors) 1. Lie on your abdomen on your bed or a firm surface. You can put a pillow under your hips if that is more comfortable. 2. Bend your left / right knee so your foot is straight up in the air. 3. Squeeze your buttock muscles and lift your left / right thigh off the bed. Do not let your back arch. 4. Tense this muscle as hard as you can without increasing any knee pain. 5. Hold this position for __________ seconds. 6. Slowly lower your leg to the starting position and allow it to relax completely. Repeat __________ times. Complete this exercise __________ times a day. Exercise E: Hip hike 1. Stand sideways on a bottom step. Stand on your left / right leg with your other foot unsupported next to the step. You can hold onto the railing or wall if needed for balance. 2. Keep your knees straight and your torso square. Then, lift your left / right hip up toward the ceiling. 3. Slowly let your left / right hip lower toward the floor, past the starting position. Your foot should get closer to the floor. Do not lean or bend your knees. Repeat __________ times. Complete this exercise __________ times a day. This information is not intended to replace advice given to you by your health care provider. Make sure you discuss any questions you have with your health care provider. Document Released: 05/08/2005 Document Revised: 01/11/2016 Document Reviewed: 04/09/2015 Elsevier  Interactive Patient Education  [employer].

## 2017-05-18 ENCOUNTER — Telehealth: Payer: Self-pay

## 2017-05-18 NOTE — Telephone Encounter (Signed)
A prior authorization for Voltaren Gel was submitted to pts insurance via cover my meds. Medication has been approved from 04/18/2017 through 05/18/2018  Case ID: [id number]  Will send document to scan center once received.  Called patient to update. Pt did not answer and I could not leave a message.  Braelee Herrle, [place], CPhT 8:22 AM

## 2017-05-21 ENCOUNTER — Ambulatory Visit: Payer: Self-pay | Admitting: Rheumatology

## 2017-06-28 ENCOUNTER — Telehealth: Payer: Self-pay | Admitting: Acute Care

## 2017-06-28 DIAGNOSIS — R0602 Shortness of breath: Secondary | ICD-10-CM

## 2017-06-28 NOTE — Telephone Encounter (Signed)
Former [facility] patient in need of CT w/o contrast in March 2019 Discon't order of JN; placed current order for pt CT w/o contrast under [facility] Pt needs be seen Sept 2019; 74mo after CT completed Submitted 91mo f/u on recall list today for [facility] Nothing further needed at this time

## 2017-07-14 ENCOUNTER — Encounter (HOSPITAL_BASED_OUTPATIENT_CLINIC_OR_DEPARTMENT_OTHER): Payer: Self-pay | Admitting: Emergency Medicine

## 2017-07-14 ENCOUNTER — Emergency Department (HOSPITAL_BASED_OUTPATIENT_CLINIC_OR_DEPARTMENT_OTHER)
Admission: EM | Admit: 2017-07-14 | Discharge: 2017-07-14 | Disposition: A | Discharge status: Home / Self Care | Payer: Managed Care, Other (non HMO) | Attending: Emergency Medicine | Admitting: Emergency Medicine

## 2017-07-14 ENCOUNTER — Emergency Department (HOSPITAL_BASED_OUTPATIENT_CLINIC_OR_DEPARTMENT_OTHER): Payer: Managed Care, Other (non HMO)

## 2017-07-14 ENCOUNTER — Other Ambulatory Visit: Payer: Self-pay

## 2017-07-14 DIAGNOSIS — R112 Nausea with vomiting, unspecified: Secondary | ICD-10-CM

## 2017-07-14 DIAGNOSIS — E860 Dehydration: Secondary | ICD-10-CM | POA: Diagnosis not present

## 2017-07-14 DIAGNOSIS — R5383 Other fatigue: Secondary | ICD-10-CM | POA: Insufficient documentation

## 2017-07-14 DIAGNOSIS — R1110 Vomiting, unspecified: Secondary | ICD-10-CM | POA: Diagnosis present

## 2017-07-14 DIAGNOSIS — Z79899 Other long term (current) drug therapy: Secondary | ICD-10-CM | POA: Diagnosis not present

## 2017-07-14 DIAGNOSIS — R42 Dizziness and giddiness: Secondary | ICD-10-CM | POA: Diagnosis not present

## 2017-07-14 LAB — LIPASE, BLOOD: LIPASE: 29 U/L (ref 11–51)

## 2017-07-14 LAB — URINALYSIS, ROUTINE W REFLEX MICROSCOPIC
Glucose, UA: 100 mg/dL — AB
Hgb urine dipstick: NEGATIVE
KETONES UR: 15 mg/dL — AB
Leukocytes, UA: NEGATIVE
NITRITE: NEGATIVE
Protein, ur: 30 mg/dL — AB
pH: 5.5 (ref 5.0–8.0)

## 2017-07-14 LAB — HEPATIC FUNCTION PANEL
ALBUMIN: 4 g/dL (ref 3.5–5.0)
ALK PHOS: 100 U/L (ref 38–126)
ALT: 25 U/L (ref 14–54)
AST: 26 U/L (ref 15–41)
BILIRUBIN DIRECT: 0.3 mg/dL (ref 0.1–0.5)
BILIRUBIN INDIRECT: 1.6 mg/dL — AB (ref 0.3–0.9)
Total Bilirubin: 1.9 mg/dL — ABNORMAL HIGH (ref 0.3–1.2)
Total Protein: 7.4 g/dL (ref 6.5–8.1)

## 2017-07-14 LAB — BASIC METABOLIC PANEL
ANION GAP: 10 (ref 5–15)
BUN: 14 mg/dL (ref 6–20)
CALCIUM: 9.8 mg/dL (ref 8.9–10.3)
CO2: 25 mmol/L (ref 22–32)
Chloride: 103 mmol/L (ref 101–111)
Creatinine, Ser: 0.93 mg/dL (ref 0.44–1.00)
GFR calc Af Amer: >60 mL/min (ref >60)
GFR calc non Af Amer: >60 mL/min (ref >60)
GLUCOSE: 117 mg/dL — AB (ref 65–99)
Potassium: 3.5 mmol/L (ref 3.5–5.1)
SODIUM: 138 mmol/L (ref 135–145)

## 2017-07-14 LAB — CBC
HCT: 43.6 % (ref 36.0–46.0)
HEMOGLOBIN: 15.3 g/dL — AB (ref 12.0–15.0)
MCH: 31.2 pg (ref 26.0–34.0)
MCHC: 35.1 g/dL (ref 30.0–36.0)
MCV: 89 fL (ref 78.0–100.0)
Platelets: 279 10*3/uL (ref 150–400)
RBC: 4.9 MIL/uL (ref 3.87–5.11)
RDW: 12.1 % (ref 11.5–15.5)
WBC: 4.9 10*3/uL (ref 4.0–10.5)

## 2017-07-14 LAB — URINALYSIS, MICROSCOPIC (REFLEX)
RBC / HPF: NONE SEEN RBC/hpf (ref 0–5)
WBC, UA: NONE SEEN WBC/hpf (ref 0–5)

## 2017-07-14 MED ORDER — SODIUM CHLORIDE 0.9 % IV BOLUS (SEPSIS)
1000.0000 mL | Freq: Once | INTRAVENOUS | Status: AC
  Administered 2017-07-14: 1000 mL via INTRAVENOUS

## 2017-07-14 MED ORDER — ONDANSETRON HCL 4 MG/2ML IJ SOLN
4.0000 mg | Freq: Once | INTRAMUSCULAR | Status: AC
  Administered 2017-07-14: 4 mg via INTRAVENOUS
  Filled 2017-07-14: qty 2

## 2017-07-14 MED ORDER — ONDANSETRON HCL 4 MG PO TABS: 4.0000 mg | ORAL_TABLET | Freq: Three times a day (TID) | ORAL | 0 refills | Status: DC | PRN

## 2017-07-14 NOTE — ED Notes (Signed)
ED Provider at bedside. 

## 2017-07-14 NOTE — ED Notes (Signed)
Pt on cardiac monitor and auto VS 

## 2017-07-14 NOTE — ED Triage Notes (Signed)
Pt reports fatigue, decreased appetite x 3-4 days. States she vomited on Wednesday. Denies pain.

## 2017-07-14 NOTE — ED Provider Notes (Signed)
[facility] Provider Note   CSN: [id number] Arrival date & time: 07/14/17  1527     History   Chief Complaint Chief Complaint  Patient presents with  . Fatigue    HPI Stephanie Crawford is a 72 y.o. female.  The history is provided by the patient and medical records. No language interpreter was used.  Emesis   This is a new problem. The current episode started more than 2 days ago. The problem occurs 2 to 4 times per day. The problem has been rapidly improving. The emesis has an appearance of stomach contents. There has been no fever. Associated symptoms include chills. Pertinent negatives include no abdominal pain, no cough, no diarrhea, no fever, no headaches, no myalgias, no sweats and no URI. Risk factors include ill contacts.    Past Medical History:  Diagnosis Date  . Allergic rhinitis   . Allergy   . Anemia    prior to hysterectomy--1997  . GERD (gastroesophageal reflux disease)   . Heart murmur   . Hemorrhoid 2011  . History of shingles 04/2014    Patient Active Problem List   Diagnosis Date Noted  . Primary osteoarthritis of both knees 05/17/2017  . Primary osteoarthritis of both hands 05/17/2017  . DDD (degenerative disc disease), lumbar 05/17/2017  . History of gastroesophageal reflux (GERD) 05/17/2017  . History of shingles 05/17/2017  . History of cellulitis 05/17/2017  . Multiple lung nodules on CT 11/30/2016  . Cough 11/30/2016  . Right knee pain 04/28/2015  . Foot ulcer ([place]) 07/15/2014  . Osteoporosis 01/30/2013  . Vitamin D deficiency 01/13/2013  . Other and unspecified hyperlipidemia 01/06/2013  . Breast thickening 01/06/2013  . Skin lesion of right leg 09/19/2012  . Chronic seasonal allergic rhinitis 09/19/2012  . Elevated bilirubin 07/12/2012  . History of cardiac murmur 07/12/2012  . Rash and nonspecific skin eruption 07/11/2012  . Rectal bleeding 05/11/2011  . Abnormal EKG 03/01/2011  . General medical  examination 03/01/2011  . GERD 12/08/2009  . ANKLE EDEMA 12/08/2009    Past Surgical History:  Procedure Laterality Date  . ABDOMINAL HYSTERECTOMY  1997  . APPENDECTOMY  1960  . HEMORRHOID SURGERY  9/11   8/27 had a follow up procedure.    Marland Kitchen LIPOMA EXCISION  1975   neck, left breast and righ jaw.    OB History    No data available       Home Medications    Prior to Admission medications   Medication Sig Start Date End Date Taking? Authorizing Provider  calcium carbonate (OS-CAL) 600 MG TABS tablet Take 1,200 mg by mouth daily.    [provider]  cholecalciferol (VITAMIN D) 1000 UNITS tablet Take 2,000 Units by mouth daily.    [provider]  diclofenac sodium (VOLTAREN) 1 % GEL Apply 4 g topically 4 (four) times daily. 05/17/17   Bo Merino, MD  montelukast (SINGULAIR) 10 MG tablet Take 1 tablet (10 mg total) by mouth at bedtime. Patient not taking: Reported on 05/17/2017 02/09/17   Javier Glazier, MD    Family History Family History  Problem Relation Age of Onset  . Prostate cancer Father   . Lung cancer Maternal Grandmother   . Asthma Daughter   . Asthma Sister   . Breast cancer Brother   . Heart attack Brother     Social History Social History   Tobacco Use  . Smoking status: Never Smoker  . Smokeless tobacco: Never Used  Substance Use Topics  . Alcohol use: No    Alcohol/week: 0.0 oz  . Drug use: No     Allergies   Patient has no known allergies.   Review of Systems Review of Systems  Constitutional: Positive for activity change, chills and fatigue. Negative for diaphoresis and fever.  HENT: Negative for congestion.   Respiratory: Negative for cough, chest tightness, shortness of breath, wheezing and stridor.   Gastrointestinal: Positive for vomiting. Negative for abdominal pain and diarrhea.  Genitourinary: Negative for flank pain and frequency.  Musculoskeletal: Negative for back pain, myalgias, neck pain and neck  stiffness.  Neurological: Negative for syncope, light-headedness and headaches.  Psychiatric/Behavioral: Negative for agitation.  All other systems reviewed and are negative.    Physical Exam Updated Vital Signs BP 122/82 (BP Location: Left Arm)   Pulse (!) 109   Temp 98.9 F (37.2 C) (Oral)   Resp 18   Ht 5\' 9"  (1.753 m)   Wt 93.9 kg (207 lb)   LMP 05/23/1995   SpO2 100%   BMI 30.57 kg/m   Physical Exam  Constitutional: She is oriented to person, place, and time. She appears well-developed and well-nourished. No distress.  HENT:  Head: Normocephalic.  Eyes: Conjunctivae and EOM are normal. Pupils are equal, round, and reactive to light.  Neck: Normal range of motion.  Cardiovascular: Intact distal pulses. Tachycardia present.  No murmur heard. Pulmonary/Chest: Effort normal. No stridor. No respiratory distress. She has no wheezes. She exhibits no tenderness.  Abdominal: Soft. Bowel sounds are normal. She exhibits no distension. There is no tenderness.  Musculoskeletal: She exhibits no edema or tenderness.  Neurological: She is alert and oriented to person, place, and time. No sensory deficit. She exhibits normal muscle tone.  Skin: Skin is warm. Capillary refill takes less than 2 seconds. She is not diaphoretic. No erythema.  Psychiatric: She has a normal mood and affect.  Nursing note and vitals reviewed.    ED Treatments / Results  Labs (all labs ordered are listed, but only abnormal results are displayed) Labs Reviewed  BASIC METABOLIC PANEL - Abnormal; Notable for the following components:      Result Value   Glucose, Bld 117 (*)    All other components within normal limits  CBC - Abnormal; Notable for the following components:   Hemoglobin 15.3 (*)    All other components within normal limits  URINALYSIS, ROUTINE W REFLEX MICROSCOPIC - Abnormal; Notable for the following components:   Color, Urine AMBER (*)    Specific Gravity, Urine >1.030 (*)    Glucose,  UA 100 (*)    Bilirubin Urine MODERATE (*)    Ketones, ur 15 (*)    Protein, ur 30 (*)    All other components within normal limits  HEPATIC FUNCTION PANEL - Abnormal; Notable for the following components:   Total Bilirubin 1.9 (*)    Indirect Bilirubin 1.6 (*)    All other components within normal limits  URINALYSIS, MICROSCOPIC (REFLEX) - Abnormal; Notable for the following components:   Bacteria, UA FEW (*)    Squamous Epithelial / LPF 0-5 (*)    Crystals CA OXALATE CRYSTALS (*)    All other components within normal limits  LIPASE, BLOOD    EKG  EKG Interpretation  Date/Time:  Saturday July 14 2017 15:33:50 EST Ventricular Rate:  104 PR Interval:  142 QRS Duration: 68 QT Interval:  336 QTC Calculation: 441 R Axis:   11 Text Interpretation:  Sinus tachycardia  Otherwise normal ECG When comapred to prior, new S1Q3 pattern.  No STEMI Confirmed by Antony Blackbird [id number]) on 07/14/2017 3:39:39 PM       Radiology Dg Chest 2 View  Result Date: 07/14/2017 CLINICAL DATA:  Lightheadedness.  Fatigue.  History of heart murmur. EXAM: CHEST  2 VIEW COMPARISON:  11/10/2013 FINDINGS: Cardiac silhouette is normal in size and configuration. No mediastinal or hilar masses. No evidence of adenopathy. Clear lungs.  No pleural effusion or pneumothorax. Skeletal structures are intact. IMPRESSION: No active cardiopulmonary disease. Electronically Signed   By: Lajean Manes M.D.   On: 07/14/2017 17:03    Procedures Procedures (including critical care time)  Medications Ordered in ED Medications  sodium chloride 0.9 % bolus 1,000 mL (0 mLs Intravenous Stopped 07/14/17 1851)  ondansetron (ZOFRAN) injection 4 mg (4 mg Intravenous Given 07/14/17 1718)  sodium chloride 0.9 % bolus 1,000 mL (0 mLs Intravenous Stopped 07/14/17 1755)     Initial Impression / Assessment and Plan / ED Course  I have reviewed the triage vital signs and the nursing notes.  Pertinent labs & imaging results that were  available during my care of the patient were reviewed by me and considered in my medical decision making (see chart for details).     NICCOLE WITTHUHN is a 72 y.o. female with a past medical history significant for GERD and anemia who presents with 4 days of nausea, vomiting, lightheadedness, fatigue, malaise, decreased p.o. intake, and mild headaches.  Patient reports that she has had multiple sick contacts with people with pneumonias.  She denies any urinary symptoms, conservation, or diarrhea.  She denies any rhinorrhea, cough, or congestion.  She denies any chest pain or shortness of breath.  She reports that when she tries to stand or ambulate she gets very lightheaded and feels near syncopal.  She has had very little oral intake in the last 4 days due to her nausea and vomiting.  She denies any numbness, tingling, or unilateral weakness.  She denies any speech of normality's or coordination problems.  She denies any neck pain or neck stiffness.  She denies any fevers or chills.  On exam, patient got lightheaded when she tried to sit up.  Patient's lungs were otherwise clear.  Chest and abdomen nontender.  No focal neurologic deficits seen.  Patient's or pharynx was very dry appearing mucous membranes.  No significant edema seen in the legs.  Based on exam I am concerned patient had orthostatic hypotension and dehydration causing her symptoms likely secondary to the nausea and vomiting.  Patient may have a viral infection causing her symptoms however will look for occult bacterial infection, electrolyte abnormalities, or organ injury from dehydration.  Patient was given fluids and nausea medicine during initial workup.  Anticipate reassessment after workup.  Patient will likely be stable for discharge if she is feeling better after rehydration.  Diagnostic testing results and above.  Patient had evidence of dehydration on urinalysis but no evidence of infection.  No evidence of pneumonia or  urinary tract infection.  Patient felt much better after fluids.  Next  Suspect dehydration in the setting of the nausea and vomiting.  Patient was feeling better in regards to nausea.  Patient will be given prescription for Zofran and discharged home with PCP follow-up and instructions to stay hydrated.  Patient will follow up with PCP in several days and understood return precautions.  Patient had no other questions or concerns and was discharged in good condition.  Final Clinical Impressions(s) / ED Diagnoses   Final diagnoses:  Fatigue, unspecified type  Dehydration  Nausea and vomiting, intractability of vomiting not specified, unspecified vomiting type    ED Discharge Orders        Ordered    ondansetron (ZOFRAN) 4 MG tablet  Every 8 hours PRN     07/14/17 1845     Clinical Impression: 1. Fatigue, unspecified type   2. Dehydration   3. Nausea and vomiting, intractability of vomiting not specified, unspecified vomiting type     Disposition: Discharge  Condition: Good  I have discussed the results, Dx and Tx plan with the pt(& family if present). He/she/they expressed understanding and agree(s) with the plan. Discharge instructions discussed at great length. Strict return precautions discussed and pt &/or family have verbalized understanding of the instructions. No further questions at time of discharge.    Discharge Medication List as of 07/14/2017  6:46 PM    START taking these medications   Details  ondansetron (ZOFRAN) 4 MG tablet Take 1 tablet (4 mg total) by mouth every 8 (eight) hours as needed for nausea or vomiting., Starting Sat 07/14/2017, Print        Follow Up: Lanice Shirts, MD [address] [place] [address] [phone]     [facility] [address] [id number] [id number] [place] [address] [phone]       Domonic Kimball, Gwenyth Allegra, MD 07/15/17 [phone]

## 2017-07-14 NOTE — Discharge Instructions (Signed)
Your workup today showed no evidence of serious bacterial infection including no evidence of pneumonia and no evidence of UTI.  You showed some evidence of dehydration on her lab work but  your liver and kidneys appear to be working normally.  Please stay hydrated and use the nausea medicine to help stay hydrated.  Please follow-up with a primary care physician in several days.  If any symptoms change or worsen, please return to the nearest emergency department.

## 2017-07-19 DIAGNOSIS — M810 Age-related osteoporosis without current pathological fracture: Secondary | ICD-10-CM | POA: Diagnosis not present

## 2017-07-23 DIAGNOSIS — R0989 Other specified symptoms and signs involving the circulatory and respiratory systems: Secondary | ICD-10-CM | POA: Diagnosis not present

## 2017-07-23 DIAGNOSIS — R05 Cough: Secondary | ICD-10-CM | POA: Diagnosis not present

## 2017-07-23 DIAGNOSIS — J209 Acute bronchitis, unspecified: Secondary | ICD-10-CM | POA: Diagnosis not present

## 2017-07-26 ENCOUNTER — Ambulatory Visit (HOSPITAL_BASED_OUTPATIENT_CLINIC_OR_DEPARTMENT_OTHER): Payer: Managed Care, Other (non HMO)

## 2017-07-26 ENCOUNTER — Telehealth: Payer: Self-pay | Admitting: Emergency Medicine

## 2017-07-26 ENCOUNTER — Other Ambulatory Visit (HOSPITAL_BASED_OUTPATIENT_CLINIC_OR_DEPARTMENT_OTHER): Payer: Managed Care, Other (non HMO)

## 2017-07-26 ENCOUNTER — Ambulatory Visit (HOSPITAL_BASED_OUTPATIENT_CLINIC_OR_DEPARTMENT_OTHER)
Admission: RE | Admit: 2017-07-26 | Discharge: 2017-07-26 | Disposition: A | Discharge status: Home / Self Care | Payer: Managed Care, Other (non HMO) | Source: Ambulatory Visit | Attending: Acute Care | Admitting: Acute Care

## 2017-07-26 DIAGNOSIS — I700 Atherosclerosis of aorta: Secondary | ICD-10-CM | POA: Insufficient documentation

## 2017-07-26 DIAGNOSIS — R0602 Shortness of breath: Secondary | ICD-10-CM | POA: Insufficient documentation

## 2017-07-26 DIAGNOSIS — R918 Other nonspecific abnormal finding of lung field: Secondary | ICD-10-CM | POA: Insufficient documentation

## 2017-07-26 DIAGNOSIS — J189 Pneumonia, unspecified organism: Secondary | ICD-10-CM | POA: Diagnosis not present

## 2017-07-26 NOTE — Telephone Encounter (Signed)
Spoke to pt ct has been approved [id number] [facility] Stephanie Crawford

## 2017-07-30 NOTE — Progress Notes (Signed)
LMTCB x1 on preferred phone number listed for patient. Patient has follow up with Dr. Lamonte Sakai on 08/06/17

## 2017-07-31 ENCOUNTER — Telehealth: Payer: Self-pay | Admitting: Emergency Medicine

## 2017-07-31 DIAGNOSIS — R918 Other nonspecific abnormal finding of lung field: Secondary | ICD-10-CM

## 2017-07-31 NOTE — Telephone Encounter (Signed)
Order placed for CT 07/2018

## 2017-07-31 NOTE — Telephone Encounter (Signed)
Advised pt of results. Pt understood and nothing further is needed.   

## 2017-07-31 NOTE — Telephone Encounter (Signed)
Notes recorded by Magdalen Spatz, NP on 07/27/2017 at 5:24 PM EST Please call patient and let her know her her scan shows stable nodules. We will recheck a CT chest again in 1 year to ensure there has been no change. Please make sure she has a follow up appointment with another provider March 2019, as she was supposed to have a 6 month follow up with [facility]. Please place order for follow up scan in March 2020. Thanks. ------------------------------------------- Left a message for the pt to return our call x1.

## 2017-07-31 NOTE — Telephone Encounter (Signed)
Patient returned, CB is [phone]

## 2017-08-03 ENCOUNTER — Telehealth: Payer: Self-pay | Admitting: Emergency Medicine

## 2017-08-03 NOTE — Telephone Encounter (Signed)
Spoke with the pt  She was already notified of her ct chest results  She thinks maybe it was an old msg that she just received from [place]  She has an ov Monday 3/18  No call placed to her since we spoke with her on 07/31/17  Nothing further needed

## 2017-08-06 ENCOUNTER — Ambulatory Visit (INDEPENDENT_AMBULATORY_CARE_PROVIDER_SITE_OTHER): Payer: Managed Care, Other (non HMO) | Admitting: Emergency Medicine

## 2017-08-06 ENCOUNTER — Encounter: Payer: Self-pay | Admitting: Emergency Medicine

## 2017-08-06 DIAGNOSIS — R05 Cough: Secondary | ICD-10-CM | POA: Diagnosis not present

## 2017-08-06 DIAGNOSIS — R918 Other nonspecific abnormal finding of lung field: Secondary | ICD-10-CM | POA: Diagnosis not present

## 2017-08-06 DIAGNOSIS — R911 Solitary pulmonary nodule: Secondary | ICD-10-CM | POA: Diagnosis not present

## 2017-08-06 DIAGNOSIS — R059 Cough, unspecified: Secondary | ICD-10-CM

## 2017-08-06 NOTE — Patient Instructions (Addendum)
If your cough persists then you may want to consider restarting treatment for allergic rhinitis and for esophageal reflux. Good therapy for allergic rhinitis would be a nonsedating antihistamine such as loratadine, fexofenadine, Zyrtec, etc. or possibly a steroid nasal spray like fluticasone nasal spray (Flonase). Therapy for esophageal reflux would consist of medications like omeprazole (Prilosec) 20 mg once or twice a day. Try to cut down on drinking carbonated sodas. Your CT chest shows that your pulmonary nodules are unchanged. This is good news. We should plan to repeat your CT in 1 year, March 2020 Follow with Dr Lamonte Sakai in 12 months or sooner if you have any problems

## 2017-08-06 NOTE — Assessment & Plan Note (Signed)
I reviewed all of her CT scans of the chest.  There is been no significant interval change on her most recent scan in March 2019.  She does have a family history of lung cancer.  I believe she needs a repeat in 1 year.

## 2017-08-06 NOTE — Progress Notes (Signed)
Subjective:    Patient ID: Stephanie Crawford, female    DOB: 09-01-1945, 72 y.o.   MRN: [id number]  HPI 72 year old never smoker who has been followed in our office by Dr. Ashok Cordia for chronic cough in the setting of allergic rhinitis, pulmonary nodules noted on CT scan of the chest, restrictive lung disease is been ascribed to her weight.  She underwent a repeat CT scan of her chest on 07/26/17 that I have reviewed.  This was compared with 10/2016.  There has been no interval change in the subpleural consolidation and nodular consolidation in the right lower lobe or in her smaller scattered pulmonary nodules. She had URI sx beginning about 2 weeks ago, was treated with abx, cough suppression. She tried singulair, now off.    Review of Systems  Past Medical History:  Diagnosis Date  . Allergic rhinitis   . Allergy   . Anemia    prior to hysterectomy--1997  . GERD (gastroesophageal reflux disease)   . Heart murmur   . Hemorrhoid 2011  . History of shingles 04/2014     Family History  Problem Relation Age of Onset  . Prostate cancer Father   . Lung cancer Maternal Grandmother   . Asthma Daughter   . Asthma Sister   . Breast cancer Brother   . Heart attack Brother      Social History   Socioeconomic History  . Marital status: Married    Spouse name: Not on file  . Number of children: Not on file  . Years of education: Not on file  . Highest education level: Not on file  Social Needs  . Financial resource strain: Not on file  . Food insecurity - worry: Not on file  . Food insecurity - inability: Not on file  . Transportation needs - medical: Not on file  . Transportation needs - non-medical: Not on file  Occupational History  . Not on file  Tobacco Use  . Smoking status: Never Smoker  . Smokeless tobacco: Never Used  Substance and Sexual Activity  . Alcohol use: No    Alcohol/week: 0.0 oz  . Drug use: No  . Sexual activity: Yes  Other Topics Concern  . Not on file   Social History Narrative   [facility] (11/30/16):   Originally from [place], [place]. Grew up primarily in [place], [place]. She has also lived in [place]. She moved to [facility] in 2006. 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 Recently has acquired an outside dog. No bird exposure. Possible mold problem in her current home with history of water damage. No hot tub exposure. Enjoys babysitting her grandsons. Remote travel to [place], [place], [place], [place], [place], [place], [place], [place], [place], & [place].      No Known Allergies   Outpatient Medications Prior to Visit  Medication Sig Dispense Refill  . calcium carbonate (OS-CAL) 600 MG TABS tablet Take 1,200 mg by mouth daily.    . cholecalciferol (VITAMIN D) 1000 UNITS tablet Take 2,000 Units by mouth daily.    . diclofenac sodium (VOLTAREN) 1 % GEL Apply 4 g topically 4 (four) times daily. 5 Tube 1  . montelukast (SINGULAIR) 10 MG tablet Take 1 tablet (10 mg total) by mouth at bedtime. (Patient not taking: Reported on 05/17/2017) 30 tablet 6  . ondansetron (ZOFRAN) 4 MG tablet Take 1 tablet (4 mg total) by  mouth every 8 (eight) hours as needed for nausea or vomiting. (Patient not taking: Reported on 08/06/2017) 12 tablet 0   No facility-administered medications prior to visit.          Objective:   Physical Exam Vitals:   08/06/17 1425  BP: (!) 142/82  Pulse: 87  SpO2: 98%  Weight: 218 lb 3.2 oz (99 kg)  Height: 5\' 10"  (1.778 m)   Gen: Pleasant, well-nourished, in no distress,  normal affect  ENT: No lesions,  mouth clear,  oropharynx clear, no postnasal drip  Neck: No JVD, no stridor  Lungs: No use of accessory muscles, clear without rales or rhonchi  Cardiovascular: RRR, heart sounds normal, no murmur or gallops, no peripheral edema  Musculoskeletal: No deformities, no cyanosis or  clubbing  Neuro: alert, non focal  Skin: Warm, no lesions or rashes      Assessment & Plan:  Cough Flaring after a recent upper respiratory infection.  Discussed the potential contributors.  If your cough persists then you may want to consider restarting treatment for allergic rhinitis and for esophageal reflux. Good therapy for allergic rhinitis would be a nonsedating antihistamine such as loratadine, fexofenadine, Zyrtec, etc. or possibly a steroid nasal spray like fluticasone nasal spray (Flonase). Therapy for esophageal reflux would consist of medications like omeprazole (Prilosec) 20 mg once or twice a day. Try to cut down on drinking carbonated sodas.Follow with Dr Lamonte Sakai in 12 months or sooner if you have any problems  Multiple lung nodules on CT I reviewed all of her CT scans of the chest.  There is been no significant interval change on her most recent scan in March 2019.  She does have a family history of lung cancer.  I believe she needs a repeat in 1 year.  Baltazar Apo, MD, PhD 08/06/2017, 3:05 PM [facility] [phone] or if no answer [phone]

## 2017-08-06 NOTE — Assessment & Plan Note (Signed)
Flaring after a recent upper respiratory infection.  Discussed the potential contributors.  If your cough persists then you may want to consider restarting treatment for allergic rhinitis and for esophageal reflux. Good therapy for allergic rhinitis would be a nonsedating antihistamine such as loratadine, fexofenadine, Zyrtec, etc. or possibly a steroid nasal spray like fluticasone nasal spray (Flonase). Therapy for esophageal reflux would consist of medications like omeprazole (Prilosec) 20 mg once or twice a day. Try to cut down on drinking carbonated sodas.Follow with Dr Lamonte Sakai in 12 months or sooner if you have any problems

## 2017-10-22 ENCOUNTER — Other Ambulatory Visit: Payer: Self-pay | Admitting: Rheumatology

## 2017-10-22 NOTE — Telephone Encounter (Signed)
Last Visit: 05/17/17 Next Visit: 05/06/18  Okay to refill per Dr. Estanislado Pandy

## 2017-10-26 ENCOUNTER — Other Ambulatory Visit (HOSPITAL_BASED_OUTPATIENT_CLINIC_OR_DEPARTMENT_OTHER): Payer: Managed Care, Other (non HMO)

## 2017-12-05 DIAGNOSIS — E7800 Pure hypercholesterolemia, unspecified: Secondary | ICD-10-CM | POA: Diagnosis not present

## 2017-12-05 DIAGNOSIS — E559 Vitamin D deficiency, unspecified: Secondary | ICD-10-CM | POA: Diagnosis not present

## 2017-12-05 DIAGNOSIS — Z0000 Encounter for general adult medical examination without abnormal findings: Secondary | ICD-10-CM | POA: Diagnosis not present

## 2017-12-05 DIAGNOSIS — R911 Solitary pulmonary nodule: Secondary | ICD-10-CM | POA: Diagnosis not present

## 2017-12-05 DIAGNOSIS — M810 Age-related osteoporosis without current pathological fracture: Secondary | ICD-10-CM | POA: Diagnosis not present

## 2017-12-05 DIAGNOSIS — R3915 Urgency of urination: Secondary | ICD-10-CM | POA: Diagnosis not present

## 2017-12-05 DIAGNOSIS — R079 Chest pain, unspecified: Secondary | ICD-10-CM | POA: Diagnosis not present

## 2018-02-05 ENCOUNTER — Other Ambulatory Visit: Payer: Self-pay | Admitting: Internal Medicine

## 2018-02-05 DIAGNOSIS — Z1231 Encounter for screening mammogram for malignant neoplasm of breast: Secondary | ICD-10-CM

## 2018-02-21 ENCOUNTER — Ambulatory Visit
Admission: RE | Admit: 2018-02-21 | Discharge: 2018-02-21 | Disposition: A | Discharge status: Home / Self Care | Payer: Managed Care, Other (non HMO) | Source: Ambulatory Visit | Attending: Internal Medicine | Admitting: Internal Medicine

## 2018-02-21 ENCOUNTER — Other Ambulatory Visit: Payer: Self-pay | Admitting: Internal Medicine

## 2018-02-21 DIAGNOSIS — M25462 Effusion, left knee: Secondary | ICD-10-CM

## 2018-02-21 DIAGNOSIS — M79662 Pain in left lower leg: Secondary | ICD-10-CM | POA: Diagnosis not present

## 2018-02-21 DIAGNOSIS — M7989 Other specified soft tissue disorders: Secondary | ICD-10-CM | POA: Diagnosis not present

## 2018-02-21 DIAGNOSIS — M25562 Pain in left knee: Secondary | ICD-10-CM | POA: Diagnosis not present

## 2018-02-21 DIAGNOSIS — M1712 Unilateral primary osteoarthritis, left knee: Secondary | ICD-10-CM | POA: Diagnosis not present

## 2018-03-05 ENCOUNTER — Ambulatory Visit
Admission: RE | Admit: 2018-03-05 | Discharge: 2018-03-05 | Disposition: A | Discharge status: Home / Self Care | Payer: Managed Care, Other (non HMO) | Source: Ambulatory Visit | Attending: Internal Medicine | Admitting: Internal Medicine

## 2018-03-05 DIAGNOSIS — M1712 Unilateral primary osteoarthritis, left knee: Secondary | ICD-10-CM | POA: Diagnosis not present

## 2018-03-05 DIAGNOSIS — Z1231 Encounter for screening mammogram for malignant neoplasm of breast: Secondary | ICD-10-CM

## 2018-03-19 NOTE — Progress Notes (Signed)
Office Visit Note  Patient: Stephanie Crawford             Date of Birth: 24-Mar-1946           MRN: [id number]             PCP: Stephanie Shirts, MD Referring: Stephanie Crawford, * Visit Date: 04/02/2018 Occupation: @GUAROCC @  Subjective:  Left knee pain  History of Present Illness: Stephanie Crawford is a 72 y.o. female with history of osteoporosis, DDD, and osteoarthritis.  She is taking calcium and vitamin D supplements daily. She is not taking anything for osteoporosis.  She states she developed left knee pain at the beginning of October 2019. She denies any injuries, but she states she was walking more running errands and develops swelling in the popliteal region of the left knee.  She followed up with her PCP and had an ultrasound to rule out a DVT.  She was having pain with ROM and bearing weight. She was evaluated by Dr. Alfonso Crawford who ordered a X-ray that revealed osteoarthritis.  She had a cortisone injection performed that resolved the swelling in the popliteal region, but she continues to have left knee pain.  She reports she noticed swelling but no warmth.  She has been soaking her knee in the bath and using heat.  She tried taking Aleve initially but discontinued.  She would like a refill of voltaren gel. She continues to have bilateral trochanteric bursitis.  She has occasional lower back pain.  She denies any other joint pain or joint swelling.     Activities of Daily Living:  Patient reports morning stiffness for 5 minutes.   Patient Reports nocturnal pain.  Difficulty dressing/grooming: Denies Difficulty climbing stairs: Reports Difficulty getting out of chair: Reports Difficulty using hands for taps, buttons, cutlery, and/or writing: Denies  Review of Systems  Constitutional: Positive for fatigue.  HENT: Positive for mouth dryness. Negative for mouth sores and nose dryness.   Eyes: Negative for pain, visual disturbance and dryness.  Respiratory: Negative for  cough, hemoptysis, shortness of breath and difficulty breathing.   Cardiovascular: Negative for chest pain, palpitations, hypertension and swelling in legs/feet.  Gastrointestinal: Negative for blood in stool, constipation and diarrhea.  Endocrine: Negative for increased urination.  Genitourinary: Negative for painful urination.  Musculoskeletal: Positive for arthralgias, joint pain, joint swelling and morning stiffness. Negative for myalgias, muscle weakness, muscle tenderness and myalgias.  Skin: Negative for color change, pallor, rash, hair loss, nodules/bumps, skin tightness, ulcers and sensitivity to sunlight.  Allergic/Immunologic: Negative for susceptible to infections.  Neurological: Negative for dizziness, numbness, headaches and weakness.  Hematological: Negative for swollen glands.  Psychiatric/Behavioral: Positive for sleep disturbance. Negative for depressed mood. The patient is not nervous/anxious.     PMFS History:  Patient Active Problem List   Diagnosis Date Noted  . Primary osteoarthritis of both knees 05/17/2017  . Primary osteoarthritis of both hands 05/17/2017  . DDD (degenerative disc disease), lumbar 05/17/2017  . History of gastroesophageal reflux (GERD) 05/17/2017  . History of shingles 05/17/2017  . History of cellulitis 05/17/2017  . Multiple lung nodules on CT 11/30/2016  . Cough 11/30/2016  . Right knee pain 04/28/2015  . Foot ulcer ([place]) 07/15/2014  . Osteoporosis 01/30/2013  . Vitamin D deficiency 01/13/2013  . Other and unspecified hyperlipidemia 01/06/2013  . Breast thickening 01/06/2013  . Skin lesion of right leg 09/19/2012  . Chronic seasonal allergic rhinitis 09/19/2012  . Elevated bilirubin 07/12/2012  .  History of cardiac murmur 07/12/2012  . Rash and nonspecific skin eruption 07/11/2012  . Rectal bleeding 05/11/2011  . Abnormal EKG 03/01/2011  . General medical examination 03/01/2011  . GERD 12/08/2009  . ANKLE EDEMA 12/08/2009    Past  Medical History:  Diagnosis Date  . Allergic rhinitis   . Allergy   . Anemia    prior to hysterectomy--1997  . GERD (gastroesophageal reflux disease)   . Heart murmur   . Hemorrhoid 2011  . History of shingles 04/2014    Family History  Problem Relation Age of Onset  . Prostate cancer Father   . Lung cancer Maternal Grandmother   . Asthma Daughter   . Asthma Sister   . Breast cancer Brother   . Heart attack Brother    Past Surgical History:  Procedure Laterality Date  . ABDOMINAL HYSTERECTOMY  1997  . APPENDECTOMY  1960  . HEMORRHOID SURGERY  9/11   8/27 had a follow up procedure.    Marland Kitchen LIPOMA EXCISION  1975   neck, left breast and righ jaw.   Social History   Social History Narrative   [facility] (11/30/16):   Originally from [place], [place]. Grew up primarily in [place], [place]. She has also lived in [place]. She moved to [facility] in 2006. 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 Recently has acquired an outside dog. No bird exposure. Possible mold problem in her current home with history of water damage. No hot tub exposure. Enjoys babysitting her grandsons. Remote travel to [place], [place], [place], [place], [place], [place], [place], [place], [place], & [place].     Objective: Vital Signs: BP 121/75 (BP Location: Left Arm, Patient Position: Sitting, Cuff Size: Normal)   Pulse 84   Ht 5\' 10"  (1.778 m)   Wt 219 lb 6.4 oz (99.5 kg)   LMP 05/23/1995   BMI 31.48 kg/m    Physical Exam  Constitutional: She is oriented to person, place, and time. She appears well-developed and well-nourished.  HENT:  Head: Normocephalic and atraumatic.  Eyes: Conjunctivae and EOM are normal.  Neck: Normal range of motion.  Cardiovascular: Normal rate, regular rhythm, normal heart sounds and intact distal pulses.  Pulmonary/Chest: Effort normal and breath  sounds normal.  Abdominal: Soft. Bowel sounds are normal.  Lymphadenopathy:    She has no cervical adenopathy.  Neurological: She is alert and oriented to person, place, and time.  Skin: Skin is warm and dry. Capillary refill takes less than 2 seconds.  Psychiatric: She has a normal mood and affect. Her behavior is normal.  Nursing note and vitals reviewed.    Musculoskeletal Exam: C-spine, thoracic spine, and lumbar spine good ROM.  Shoulder joints, elbow joints, wrist joints, MCPs, PIPs, and DIPs good ROM with no synovitis.  Hip joints, knee joints, ankle joints, MTPs, PIPs, and DIPs good ROM with no synovitis.  No warmth or effusion of knee joints.  No tenderness or swelling of ankle joints.  Tenderness of bilateral trochanteric bursa.   CDAI Exam: CDAI Score: Not documented Patient Global Assessment: Not documented; Provider Global Assessment: Not documented Swollen: Not documented; Tender: Not documented Joint Exam   Not documented   There is currently no information documented on the homunculus. Go to the Rheumatology activity and complete the homunculus joint exam.  Investigation: No additional findings.  Imaging: Mm 3d Screen Breast Bilateral  Result Date: 03/05/2018 CLINICAL  DATA:  Screening. EXAM: DIGITAL SCREENING BILATERAL MAMMOGRAM WITH TOMO AND CAD COMPARISON:  Previous exam(s). ACR Breast Density Category b: There are scattered areas of fibroglandular density. FINDINGS: There are no findings suspicious for malignancy. Images were processed with CAD. IMPRESSION: No mammographic evidence of malignancy. A result letter of this screening mammogram will be mailed directly to the patient. RECOMMENDATION: Screening mammogram in one year. (Code:SM-B-01Y) BI-RADS CATEGORY  1: Negative. Electronically Signed   By: Stephanie Crawford M.D.   On: 03/05/2018 16:28    Recent Labs: Lab Results  Component Value Date   WBC 4.9 07/14/2017   HGB 15.3 (H) 07/14/2017   PLT 279  07/14/2017   NA 138 07/14/2017   K 3.5 07/14/2017   CL 103 07/14/2017   CO2 25 07/14/2017   GLUCOSE 117 (H) 07/14/2017   BUN 14 07/14/2017   CREATININE 0.93 07/14/2017   BILITOT 1.9 (H) 07/14/2017   ALKPHOS 100 07/14/2017   AST 26 07/14/2017   ALT 25 07/14/2017   PROT 7.4 07/14/2017   ALBUMIN 4.0 07/14/2017   CALCIUM 9.8 07/14/2017   GFRAA >60 07/14/2017    Speciality Comments: No specialty comments available.  Procedures:  No procedures performed Allergies: Patient has no known allergies.   Assessment / Plan:     Visit Diagnoses: Age-related osteoporosis without current pathological fracture - Her most recent DEXA was 07/19/2017 T-score: -2.7 neck right. Previous DXA 02/03/2015 T -2.6, BMD 0.875.  She does not want to take anything for treatment of osteoporosis.  She has been taking calcium and vitamin D daily.  She has not had any recent falls or fractures.    History of vitamin D deficiency: She takes vitamin D 2,000 units by mouth daily.   Primary osteoarthritis of both hands: She has no synovitis or tenderness.  Complete fist formation.  Joint protection and muscle strengthening were discussed.   Primary osteoarthritis of both knees: She has been having left knee pain for 1 month.  Patient denies any injuries.  She had increased her level activity and developed swelling in the popliteal region.  She is concerned about a DVT and followed up with primary care who obtained an ultrasound was negative for DVT.  She was evaluated by Dr. Alfonso Crawford at [facility] office to obtain an x-ray that revealed osteoarthritis.  He performed a cortisone injection in October 2019.  She reports that the cortisone injection resolved and swelling in the popliteal region but she continues to have pain along the medial joint line.  No warmth or effusion was noted today.  She has good range of motion.  She initially was taking Aleve for pain relief but discontinued.  She requested a refill of Voltaren gel  which was sent to the pharmacy.  She was given a list of natural anti-inflammatories that she can try taking.  She was also given a handout of knee exercises that she can perform at home.  We also discussed trying to wear a knee brace of her pain continues.  We discussed in the future possibly moving forward with Visco gel injections.  She was advised to bring a copy of the x-rays at her next visit.  Trochanteric bursitis of both hips: She continues to have tenderness to palpation.   DDD (degenerative disc disease), lumbar: No midline spinal tenderness.  She has intermittent lower back pain.   Other medical conditions are listed as follows:   History of cellulitis - right foot, MRSA 2016   History of gastroesophageal reflux (  GERD)  History of shingles - 2015.  History of cardiac murmur   Orders: No orders of the defined types were placed in this encounter.  Meds ordered this encounter  Medications  . diclofenac sodium (VOLTAREN) 1 % GEL    Sig: Apply 3 grams to 3 large joints up to 3 times daily.    Dispense:  3 Tube    Refill:  3    Face-to-face time spent with patient was 30 minutes. Greater than 50% of time was spent in counseling and coordination of care.  Follow-Up Instructions: Return in about 1 year (around 04/03/2019) for Osteoporosis, Osteoarthritis, DDD.   Stephanie Neas, PA-C  Note - This record has been created using Dragon software.  Chart creation errors have been sought, but may not always  have been located. Such creation errors do not reflect on  the standard of medical care.

## 2018-04-02 ENCOUNTER — Encounter: Payer: Self-pay | Admitting: Rheumatology

## 2018-04-02 ENCOUNTER — Ambulatory Visit (INDEPENDENT_AMBULATORY_CARE_PROVIDER_SITE_OTHER): Payer: Managed Care, Other (non HMO) | Admitting: Rheumatology

## 2018-04-02 VITALS — BP 121/75 | HR 84 | Ht 70.0 in | Wt 219.4 lb

## 2018-04-02 DIAGNOSIS — Z8639 Personal history of other endocrine, nutritional and metabolic disease: Secondary | ICD-10-CM

## 2018-04-02 DIAGNOSIS — Z8679 Personal history of other diseases of the circulatory system: Secondary | ICD-10-CM

## 2018-04-02 DIAGNOSIS — M19041 Primary osteoarthritis, right hand: Secondary | ICD-10-CM

## 2018-04-02 DIAGNOSIS — M810 Age-related osteoporosis without current pathological fracture: Secondary | ICD-10-CM | POA: Diagnosis not present

## 2018-04-02 DIAGNOSIS — M19042 Primary osteoarthritis, left hand: Secondary | ICD-10-CM

## 2018-04-02 DIAGNOSIS — Z872 Personal history of diseases of the skin and subcutaneous tissue: Secondary | ICD-10-CM

## 2018-04-02 DIAGNOSIS — Z8619 Personal history of other infectious and parasitic diseases: Secondary | ICD-10-CM

## 2018-04-02 DIAGNOSIS — M7061 Trochanteric bursitis, right hip: Secondary | ICD-10-CM | POA: Diagnosis not present

## 2018-04-02 DIAGNOSIS — Z8719 Personal history of other diseases of the digestive system: Secondary | ICD-10-CM | POA: Diagnosis not present

## 2018-04-02 DIAGNOSIS — M170 Bilateral primary osteoarthritis of knee: Secondary | ICD-10-CM

## 2018-04-02 DIAGNOSIS — M7062 Trochanteric bursitis, left hip: Secondary | ICD-10-CM | POA: Diagnosis not present

## 2018-04-02 DIAGNOSIS — M5136 Other intervertebral disc degeneration, lumbar region: Secondary | ICD-10-CM

## 2018-04-02 MED ORDER — DICLOFENAC SODIUM 1 % TD GEL: TRANSDERMAL | 3 refills | Status: DC

## 2018-04-02 NOTE — Patient Instructions (Signed)

## 2018-04-23 ENCOUNTER — Other Ambulatory Visit: Payer: Self-pay | Admitting: Internal Medicine

## 2018-04-23 ENCOUNTER — Ambulatory Visit
Admission: RE | Admit: 2018-04-23 | Discharge: 2018-04-23 | Disposition: A | Discharge status: Home / Self Care | Payer: Managed Care, Other (non HMO) | Source: Ambulatory Visit | Attending: Internal Medicine | Admitting: Internal Medicine

## 2018-04-23 DIAGNOSIS — J0101 Acute recurrent maxillary sinusitis: Secondary | ICD-10-CM | POA: Diagnosis not present

## 2018-04-23 DIAGNOSIS — R05 Cough: Secondary | ICD-10-CM

## 2018-04-23 DIAGNOSIS — R059 Cough, unspecified: Secondary | ICD-10-CM

## 2018-05-06 ENCOUNTER — Ambulatory Visit: Payer: Managed Care, Other (non HMO) | Admitting: Rheumatology

## 2018-06-03 ENCOUNTER — Telehealth: Payer: Self-pay | Admitting: *Deleted

## 2018-06-03 NOTE — Telephone Encounter (Signed)
Prior Authorization submitted via cover my meds for Voltaren Gel. Will update once response received.

## 2018-07-09 DIAGNOSIS — R1084 Generalized abdominal pain: Secondary | ICD-10-CM | POA: Diagnosis not present

## 2018-07-27 ENCOUNTER — Other Ambulatory Visit: Payer: Self-pay | Admitting: Physician Assistant

## 2018-07-29 NOTE — Telephone Encounter (Signed)
Last Visit: 04/02/18 Next Visit: 04/08/19  Okay to refill per Dr. Estanislado Pandy

## 2018-07-31 ENCOUNTER — Telehealth: Payer: Self-pay | Admitting: Emergency Medicine

## 2018-07-31 ENCOUNTER — Other Ambulatory Visit: Payer: Self-pay

## 2018-07-31 ENCOUNTER — Ambulatory Visit (HOSPITAL_BASED_OUTPATIENT_CLINIC_OR_DEPARTMENT_OTHER)
Admission: RE | Admit: 2018-07-31 | Discharge: 2018-07-31 | Disposition: A | Discharge status: Home / Self Care | Payer: BLUE CROSS/BLUE SHIELD | Source: Ambulatory Visit | Attending: Acute Care | Admitting: Acute Care

## 2018-07-31 DIAGNOSIS — R918 Other nonspecific abnormal finding of lung field: Secondary | ICD-10-CM | POA: Insufficient documentation

## 2018-07-31 NOTE — Telephone Encounter (Signed)
Dr. Lamonte Sakai, you saw the pt last, please review.

## 2018-07-31 NOTE — Telephone Encounter (Signed)
I reviewed the scan. Please make her an OV to see me or SG to review the results together. Thanks.

## 2018-07-31 NOTE — Telephone Encounter (Signed)
Called spoke with patient, appt scheduled with SG for 3.18.2020 @ 1500 (pt unable to come sooner) New address and department info given to patient  Nothing further needed, will sign off

## 2018-08-01 ENCOUNTER — Other Ambulatory Visit: Payer: Self-pay | Admitting: Acute Care

## 2018-08-07 ENCOUNTER — Encounter: Payer: Self-pay | Admitting: Acute Care

## 2018-08-07 ENCOUNTER — Other Ambulatory Visit: Payer: Self-pay

## 2018-08-07 ENCOUNTER — Ambulatory Visit (INDEPENDENT_AMBULATORY_CARE_PROVIDER_SITE_OTHER): Payer: BLUE CROSS/BLUE SHIELD | Admitting: Acute Care

## 2018-08-07 DIAGNOSIS — R911 Solitary pulmonary nodule: Secondary | ICD-10-CM | POA: Diagnosis not present

## 2018-08-07 DIAGNOSIS — R918 Other nonspecific abnormal finding of lung field: Secondary | ICD-10-CM | POA: Diagnosis not present

## 2018-08-07 DIAGNOSIS — J302 Other seasonal allergic rhinitis: Secondary | ICD-10-CM

## 2018-08-07 NOTE — Assessment & Plan Note (Signed)
Well-controlled with current maintenance regimen

## 2018-08-07 NOTE — Progress Notes (Signed)
History of Present Illness Stephanie Crawford is a 73 y.o. female never smoker with chronic cough in the setting of allergic rhinitis, and pulmonary nodules noted on CT chest.  She is followed by Dr. Royston Cowper.  HPI 73 year old never smoker who has been followed in our office by Dr. Ashok Cordia for chronic cough in the setting of allergic rhinitis, pulmonary nodules noted on CT scan of the chest, restrictive lung disease is been ascribed to her weight.  She underwent a repeat CT scan of her chest on 07/26/17 that I have reviewed.  This was compared with 10/2016.  There has been no interval change in the subpleural consolidation and nodular consolidation in the right lower lobe or in her smaller scattered pulmonary nodules. She had URI sx beginning about 2 weeks ago, was treated with abx, cough suppression. She tried singulair, now off.  She had a repeat CT Chest 07/31/2018 which showed an interval increase in size of her RLL nodule from 1.8 x 1.5 cm  To 2.0 x 1.7 cm  08/07/2018  Pt. Presents for follow up of CT scan.  She is here today to review the results of her CT scan.  Dr. Lamonte Sakai has been following several pulmonary nodules with annual screenings.  CT scan done 07/31/2018 was positive for a mild increase in size of a sub-solid 2.0 cm basilar right lower lobe pulmonary nodule with a 0.4 cm solid component.  This is mildly increased in size in the interval since last CT.  There is concerned that this might be a slow-growing primary bronchogenic adenocarcinoma.  We reviewed her scan.  I explained that we do not know what this is, but we need to be diligent and evaluate the nodule.  We discussed the option of a PET scan to evaluate SUV.  We discussed the process of having a PET scan.  Stephanie Crawford is in agreement with obtaining a PET scan for further evaluation. She states she has had no changes in her health. She has had some intentional weight loss.  No unintentional weight loss.  Patient denies any hemoptysis.   She is a never smoker, PFTs done 02/08/2017 indicate relatively normal pulmonary function, low lung volumes were noted with mild restriction.  There was no postbronchodilator study done as patient was coughing.  Test Results:  Spirometry 02/09/2017 FVC 2.87 L or 76% FEV1 2.41 L or 84% FF ratio 84 or 110% DLCO 26.02 or 80% Low lung volumes indicate mild restriction  07/31/2018 CT Chest Subsolid 2.0 cm basilar right lower lobe pulmonary nodule with 0.4 cm solid component, mildly increased in size in the interval. Slow growing primary bronchogenic adenocarcinoma cannot be excluded. Multidisciplinary thoracic oncology consultation suggested. Separate 1.6 cm peripheral right lower lobe subsolid pulmonary nodule, stable. No thoracic adenopathy.  No pleural effusions. One vessel coronary atherosclerosis.    CBC Latest Ref Rng & Units 07/14/2017 09/08/2016 08/26/2014  WBC 4.0 - 10.5 K/uL 4.9 6.8 4.8  Hemoglobin 12.0 - 15.0 g/dL 15.3(H) 14.4 13.6  Hematocrit 36.0 - 46.0 % 43.6 42.6 40.8  Platelets 150 - 400 K/uL 279 310 306    BMP Latest Ref Rng & Units 07/14/2017 09/08/2016 07/16/2014  Glucose 65 - 99 mg/dL 117(H) 106(H) 100(H)  BUN 6 - 20 mg/dL 14 13 13   Creatinine 0.44 - 1.00 mg/dL 0.93 1.01(H) 0.74  Sodium 135 - 145 mmol/L 138 134(L) 135  Potassium 3.5 - 5.1 mmol/L 3.5 3.7 3.6  Chloride 101 - 111 mmol/L 103 100(L) 101  CO2  22 - 32 mmol/L 25 25 27   Calcium 8.9 - 10.3 mg/dL 9.8 9.4 9.0    BNP No results found for: BNP  ProBNP No results found for: PROBNP  PFT    Component Value Date/Time   FEV1PRE 2.41 02/09/2017 1013   FVCPRE 2.87 02/09/2017 1013   DLCOUNC 26.02 02/09/2017 1013   PREFEV1FVCRT 84 02/09/2017 1013    Ct Chest Wo Contrast  Result Date: 07/31/2018 CLINICAL DATA:  Follow-up pulmonary nodules. EXAM: CT CHEST WITHOUT CONTRAST TECHNIQUE: Multidetector CT imaging of the chest was performed following the standard protocol without IV contrast. COMPARISON:  07/26/2017  chest CT.  04/23/2018 chest radiograph. FINDINGS: Cardiovascular: Normal heart size. No significant pericardial effusion/thickening. Left anterior descending coronary atherosclerosis. Atherosclerotic nonaneurysmal thoracic aorta. Normal caliber pulmonary arteries. Mediastinum/Nodes: Suggestion of hypodense small bilateral thyroid lobe nodules, largest 1.1 cm on the left. Unremarkable esophagus. No pathologically enlarged axillary, mediastinal or hilar lymph nodes, noting limited sensitivity for the detection of hilar adenopathy on this noncontrast study. Lungs/Pleura: No pneumothorax. No pleural effusion. No acute consolidative airspace disease or lung masses. Subsolid 2.0 x 1.7 cm basilar right lower lobe pulmonary nodule (series 3/image 134) with 0.4 cm solid component, previously 1.8 x 1.5 cm using similar measurement technique, mildly increased. Peripheral right lower lobe sub solid 1.6 x 1.2 cm pulmonary nodule (series 3/image 95), previously 1.6 x 1.1 cm using similar measurement technique, not appreciably changed. Numerous tiny solid pulmonary nodules scattered in both lungs measuring up to 4 mm in the anterior right lower lobe (series 3/image 95), all stable, considered benign. No new significant pulmonary nodules. Upper abdomen: Colonic diverticulosis. Musculoskeletal: No aggressive appearing focal osseous lesions. Moderate thoracic spondylosis. IMPRESSION: 1. Subsolid 2.0 cm basilar right lower lobe pulmonary nodule with 0.4 cm solid component, mildly increased in size in the interval. Slow growing primary bronchogenic adenocarcinoma cannot be excluded. Multidisciplinary thoracic oncology consultation suggested. 2. Separate 1.6 cm peripheral right lower lobe subsolid pulmonary nodule, stable. 3. No thoracic adenopathy.  No pleural effusions. 4. One vessel coronary atherosclerosis. Aortic Atherosclerosis (ICD10-I70.0). These results will be called to the ordering clinician or representative by the  Radiologist Assistant, and communication documented in the PACS or zVision Dashboard. Electronically Signed   By: Ilona Sorrel M.D.   On: 07/31/2018 12:26     Past medical hx Past Medical History:  Diagnosis Date   Allergic rhinitis    Allergy    Anemia    prior to hysterectomy--1997   GERD (gastroesophageal reflux disease)    Heart murmur    Hemorrhoid 2011   History of shingles 04/2014     Social History   Tobacco Use   Smoking status: Never Smoker   Smokeless tobacco: Never Used  Substance Use Topics   Alcohol use: No    Alcohol/week: 0.0 standard drinks   Drug use: No    StephanieTaylor reports that she has never smoked. She has never used smokeless tobacco. She reports that she does not drink alcohol or use drugs.  Tobacco Cessation: Never smoker  Past surgical hx, Family hx, Social hx all reviewed.  Current Outpatient Medications on File Prior to Visit  Medication Sig   calcium carbonate (OS-CAL) 600 MG TABS tablet Take 1,200 mg by mouth daily.   cholecalciferol (VITAMIN D) 1000 UNITS tablet Take 2,000 Units by mouth daily.   diclofenac sodium (VOLTAREN) 1 % GEL APPLY 3 GRAMS TO 3 LARGE JOINTS UP TO 3 TIMES DAILY.   omega-3 acid ethyl esters (LOVAZA) 1  g capsule Take by mouth 2 (two) times daily.   No current facility-administered medications on file prior to visit.      No Known Allergies  Review Of Systems:  Constitutional:   No unintentional weight loss, night sweats,  Fevers, chills, fatigue, or  lassitude.  HEENT:   No headaches,  Difficulty swallowing,  Tooth/dental problems, or  Sore throat,                No sneezing, itching, ear ache, nasal congestion, post nasal drip,   CV:  No chest pain,  Orthopnea, PND, swelling in lower extremities, anasarca, dizziness, palpitations, syncope.   GI  No heartburn, indigestion, abdominal pain, nausea, vomiting, diarrhea, change in bowel habits, loss of appetite, bloody stools.   Resp: No  shortness of breath with exertion or at rest.  No excess mucus, no productive cough,  No non-productive cough,  No coughing up of blood.  No change in color of mucus.  No wheezing.  No chest wall deformity  Skin: no rash or lesions.  GU: no dysuria, change in color of urine, no urgency or frequency.  No flank pain, no hematuria   MS:  No joint pain or swelling.  No decreased range of motion.  No back pain.  Psych:  No change in mood or affect. No depression or anxiety.  No memory loss.   Vital Signs BP 112/60 (BP Location: Left Arm, Patient Position: Sitting, Cuff Size: Normal)    Pulse 100    Temp 98.8 F (37.1 C) (Oral)    Ht 5\' 10"  (1.778 m)    Wt 212 lb (96.2 kg)    LMP 05/23/1995    SpO2 97%    BMI 30.42 kg/m    Physical Exam:  General- No distress,  A&Ox3, pleasant ENT: No sinus tenderness, TM clear, pale nasal mucosa, no oral exudate,no post nasal drip, no LAN Cardiac: S1, S2, regular rate and rhythm, no murmur Chest: No wheeze/ rales/ dullness; no accessory muscle use, no nasal flaring, no sternal retractions Abd.: Soft Non-tender, nondistended, bowel sounds positive,Body mass index is 30.42 kg/m. Ext: No clubbing cyanosis, edema Neuro:  normal strength, moving all extremities x4, alert and oriented x3, Skin: No rashes, warm and dry Psych: normal mood and behavior   Assessment/Plan  Chronic seasonal allergic rhinitis Well-controlled with current maintenance regimen  Multiple lung nodules on CT Interval slight increase in right lower lobe pulmonary nodule over the last 12 months Family history of lung cancer Never smoker Plan We will schedule you for a PET scan today.( Wednesday or Thursday is best)  PET scan is scheduled for August 12, 2018 We will schedule you for a follow up appointment after the PET,  scan to go over the results with Dr. Lamonte Sakai or Judson Roch NP.  This appointment is scheduled for August 14, 2018 Please contact office for sooner follow up if symptoms do  not improve or worsen or seek emergency care        Magdalen Spatz, NP 08/07/2018  9:23 PM

## 2018-08-07 NOTE — Patient Instructions (Signed)
It is good to see you today. We will schedule you for a PET scan today.( Wednesday or Thursday is best)  We will schedule you for a follow up appointment after the PET scan to go over the results with Dr. Lamonte Sakai or Judson Roch NP. Please contact office for sooner follow up if symptoms do not improve or worsen or seek emergency care

## 2018-08-07 NOTE — Assessment & Plan Note (Signed)
Interval slight increase in right lower lobe pulmonary nodule over the last 12 months Family history of lung cancer Never smoker Plan We will schedule you for a PET scan today.( Wednesday or Thursday is best)  PET scan is scheduled for August 12, 2018 We will schedule you for a follow up appointment after the PET,  scan to go over the results with Dr. Lamonte Sakai or Judson Roch NP.  This appointment is scheduled for August 14, 2018 Please contact office for sooner follow up if symptoms do not improve or worsen or seek emergency care

## 2018-08-12 ENCOUNTER — Ambulatory Visit (HOSPITAL_COMMUNITY): Payer: Medicare Other

## 2018-08-14 ENCOUNTER — Ambulatory Visit: Payer: Medicare Other | Admitting: Acute Care

## 2018-08-15 ENCOUNTER — Other Ambulatory Visit: Payer: Self-pay

## 2018-08-15 ENCOUNTER — Encounter (HOSPITAL_COMMUNITY)
Admission: RE | Admit: 2018-08-15 | Discharge: 2018-08-15 | Disposition: A | Discharge status: Home / Self Care | Payer: BLUE CROSS/BLUE SHIELD | Source: Ambulatory Visit | Attending: Acute Care | Admitting: Acute Care

## 2018-08-15 DIAGNOSIS — R911 Solitary pulmonary nodule: Secondary | ICD-10-CM | POA: Diagnosis not present

## 2018-08-15 DIAGNOSIS — Z79899 Other long term (current) drug therapy: Secondary | ICD-10-CM | POA: Diagnosis not present

## 2018-08-15 LAB — GLUCOSE, CAPILLARY: Glucose-Capillary: 86 mg/dL (ref 70–99)

## 2018-08-15 MED ORDER — FLUDEOXYGLUCOSE F - 18 (FDG) INJECTION
10.7900 | Freq: Once | INTRAVENOUS | Status: AC
  Administered 2018-08-15: 10.79 via INTRAVENOUS

## 2018-08-21 ENCOUNTER — Other Ambulatory Visit: Payer: Self-pay

## 2018-08-21 ENCOUNTER — Encounter: Payer: Self-pay | Admitting: Acute Care

## 2018-08-21 ENCOUNTER — Ambulatory Visit (INDEPENDENT_AMBULATORY_CARE_PROVIDER_SITE_OTHER): Payer: BLUE CROSS/BLUE SHIELD | Admitting: Acute Care

## 2018-08-21 DIAGNOSIS — R911 Solitary pulmonary nodule: Secondary | ICD-10-CM

## 2018-08-21 NOTE — Progress Notes (Signed)
Virtual Visit via Video Note  I connected with Stephanie Crawford on 08/21/18 at  1:30 PM EDT by a video enabled telemedicine application and verified that I am speaking with the correct person using two identifiers.   I discussed the limitations of evaluation and management by telemedicine and the availability of in person appointments. The patient expressed understanding and agreed to proceed.   I was present in the office.  The patient was present in her home.  I  confirmed date of birth and address to authenticate  Identity. My nurse Quentin Ore reviewed medications and ordered any refills required.  Synopsis Stephanie Crawford is a 73 y.o. female never smoker with chronic cough in the setting of allergic rhinitis, and pulmonary nodules noted on CT chest.  She is followed by Dr. Royston Cowper.  History of Present Illness: Pt. Is a never smoker with a strong family history of cancer. She has pulmonary nodules that had been stable until the last CT on 07/31/2018. There was an interval change on CT Chest done CT Chest 07/31/2018 which showed an interval increase in size of her RLL nodule from 1.8 x 1.5 cm  To 2.0 x 1.7 cm  She was seen in the office 3/18, where we decided we should do a PET scan to evaluate the nodules for SUV readings, as they had interval growth on CT. Marland Kitchen PET scan was done 08/15/2018 and showed : Low level hypermetabolism (max SUV 3.1) within the subsolid 2.1 cm basilar right lower lobe pulmonary nodule, which had mildly increased in size on recent chest CT. Slow growing primary bronchogenic adenocarcinoma not excluded.  Low level metabolism (max SUV 2.3) within the subsolid 1.6 cm peripheral right lower lobe pulmonary nodule, which was stable in size on recent chest CT. Continued chest CT surveillance suggested for this nodule.  No hypermetabolic thoracic adenopathy or distant metastatic Disease.  Stable left adrenal adenoma. Moderate diffuse colonic  diverticulosis. Aortic Atherosclerosis (ICD10-I70.0).  Today's tele visit is to discuss the results of the PET scan and plan moving forward.Pt. states that she is doing well.  I Explained that her PET scan was positive for 2 areas of hypermetabolism. Both of these areas are in the right lower lobe of her lung.The nodule with an SUV of 3.1 is concerning for slow growing bronchogenic adenocarcinoma. The second nodule has an SUV of 2.3, with recommendation of  Continued CT surveillance. I explained that I had spoken with Dr. Lamonte Sakai . I reviewed with her his recommendations which are noted below: Dr. Agustina Caroli Comments:  Please let her know that I have reviewed the Ct and the PET scan. I am concerned that one of the pulmonary nodules has changed and has at least moderate risk that it could be a slow-growing adenoCA.   I recommend sending her to discuss with [facility] to at least consider options of resection (vs continuing to follow with serial films).   I discussed possible options with Ms. Schwinn. I explained that as there is no evidence of metastatic disease, and as she has never smoker and has good lung function she is an excellent surgical candidate. I explained that this is why Dr. Lamonte Sakai felt we should refer her for evaluation by Thoracic surgery for next steps. She is in agreement with the plan for referral.   Observations/Objective: Positive interval right lower lobe nodule growth per CT. 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 Low level metabolism (max SUV 2.3) within the subsolid 1.6 cm peripheral right lower lobe pulmonary nodule, which was stable in size on recent chest CT. Continued chest CT surveillance suggested for this nodule No hypermetabolic thoracic adenopathy or distant metastatic Disease Pt. Is in agreement with referral to [facility] to evaluate for possibility of  resection.    Assessment and Plan: Positive interval right lower lobe nodule growth per CT. 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 Low level metabolism (max SUV 2.3) within the subsolid 1.6 cm peripheral right lower lobe pulmonary nodule, which was stable in size on recent chest CT. Continued chest CT surveillance suggested for this nodule No hypermetabolic thoracic adenopathy or distant metastatic Disease Plan: Referral to [facility] Dr. Roxan Hockey for evaluation for  for  possible resection of above noted nodules Follow up with Pulmonary 11/13/2018 at 2:30 pm  Follow Up Instructions:  Follow up appointment scheduled for 11/13/2018 at 2:30 pm with Judson Roch NP.   I discussed the assessment and treatment plan with the patient. The patient was provided an opportunity to ask questions and all were answered. The patient agreed with the plan and demonstrated an understanding of the instructions.   The patient was advised to call back or seek an in-person evaluation if the symptoms worsen or if the condition fails to improve as anticipated.  I provided 30 minutes of non-face-to-face time during this encounter.   Magdalen Spatz, NP 08/21/2018 2:12 PM

## 2018-08-21 NOTE — Patient Instructions (Addendum)
Thanks so much for taking the time today for a teleconference. We will refer you to Dr. Roxan Hockey of Thoracic Surgery to evaluate you for resection of the pulmonary nodules in your right lower lobe.. Plan: Referral to [facility] ( [facility] and [facility] Surgery)  Dr. Roxan Hockey for evaluation possible resection of pulmonary  Nodules, which have had interval increase in size. Follow up with Pulmonary 11/13/2018 at 2:30 pm Please contact office for sooner follow up if symptoms do not improve or worsen or seek emergency care Please let us know if you need [place] for any reason. Stay safe during this frightening time. Follow the below guidelines :    Coronavirus (COVID-19) Are you at risk?  Are you at risk for the Coronavirus (COVID-19)?  To be considered HIGH RISK for Coronavirus (COVID-19), you have to meet the following criteria:  . Traveled to [place], [place], [place], [place] or [place]; or in the [place] to [place], [place], [place], or [place]; and have fever, cough, and shortness of breath within the last 2 weeks of travel OR . Been in close contact with a person diagnosed with COVID-19 within the last 2 weeks and have fever, cough, and shortness of breath . IF YOU DO NOT MEET THESE CRITERIA, YOU ARE CONSIDERED LOW RISK FOR COVID-19.  What to do if you are HIGH RISK for COVID-19?  Marland Kitchen If you are having a medical emergency, call 911. . Seek medical care right away. Before you go to a doctor's office, urgent care or emergency department, call ahead and tell them about your recent travel, contact with someone diagnosed with COVID-19, and your symptoms. You should receive instructions from your physician's office regarding next steps of care.  . When you arrive at healthcare provider, tell the healthcare staff immediately you have returned from visiting [place], [place], [place], [place] or [place]; or traveled in the [place] to [place], [place], [place], or [place];  in the last two weeks or you have been in close contact with a person diagnosed with COVID-19 in the last 2 weeks.   . Tell the health care staff about your symptoms: fever, cough and shortness of breath. . After you have been seen by a medical provider, you will be either: o Tested for (COVID-19) and discharged home on quarantine except to seek medical care if symptoms worsen, and asked to  - Stay home and avoid contact with others until you get your results (4-5 days)  - Avoid travel on public transportation if possible (such as bus, train, or airplane) or o Sent to the Emergency Department by EMS for evaluation, COVID-19 testing, and possible admission depending on your condition and test results.  What to do if you are LOW RISK for COVID-19?  Reduce your risk of any infection by using the same precautions used for avoiding the common cold or flu:  Marland Kitchen Wash your hands often with soap and warm water for at least 20 seconds.  If soap and water are not readily available, use an alcohol-based hand sanitizer with at least 60% alcohol.  . If coughing or sneezing, cover your mouth and nose by coughing or sneezing into the elbow areas of your shirt or coat, into a tissue or into your sleeve (not your hands). . Avoid shaking hands with others and consider head nods or verbal greetings only. . Avoid touching your eyes, nose, or mouth with unwashed hands.  . Avoid close contact with people who are sick. Marland Kitchen  Avoid places or events with large numbers of people in one location, like concerts or sporting events. . Carefully consider travel plans you have or are making. . If you are planning any travel outside or inside the [place], visit the CDC's Travelers' Health webpage for the latest health notices. . If you have some symptoms but not all symptoms, continue to monitor at home and seek medical attention if your symptoms worsen. . If you are having a medical emergency, call 911.   [place] / e-Visit: [email or web address]         [facility]: [facility]: [phone]                   [facility]: [phone]

## 2018-09-09 ENCOUNTER — Other Ambulatory Visit: Payer: Self-pay

## 2018-09-10 ENCOUNTER — Encounter: Payer: Medicare Other | Admitting: Thoracic Surgery (Cardiothoracic Vascular Surgery)

## 2018-09-10 ENCOUNTER — Institutional Professional Consult (permissible substitution) (INDEPENDENT_AMBULATORY_CARE_PROVIDER_SITE_OTHER): Payer: BLUE CROSS/BLUE SHIELD | Admitting: Thoracic Surgery (Cardiothoracic Vascular Surgery)

## 2018-09-10 VITALS — BP 140/90 | HR 92 | Temp 96.4°F | Resp 20 | Ht 70.0 in | Wt 204.0 lb

## 2018-09-10 DIAGNOSIS — R918 Other nonspecific abnormal finding of lung field: Secondary | ICD-10-CM | POA: Diagnosis not present

## 2018-09-10 NOTE — Progress Notes (Signed)
PCP is Schoenhoff, Altamese Cabal, MD Referring Provider is Magdalen Spatz, NP  Chief Complaint  Patient presents with  . Lung Lesion    Surgical eval, Chest CT 07/31/18, PET Scan 08/15/18, PFT's 02/09/17    HPI: Mrs. Stephanie Crawford is sent for consultation regarding right lower lobe lung nodules.  Louellen Haldeman is a 73 year old woman who is a lifelong non-smoker.  She has a past medical history significant for heart murmur since childhood, allergies, anemia, and reflux.  She was found to have lung nodules back in 2018 when she presented with a kidney stone.  She has been followed with serial CT scans since then.  In March 2020 a CT scan showed an increase in size of a basilar right lower lobe lung nodule from 1.8 x 1.5 to 2.0 x 1.7 cm.  A second sub-solid nodule was stable at 1.6 x 1.2 cm.  There were numerous tiny solid nodules that were unchanged.  A PET/CT showed mild uptake in both sub-solid nodules concerning for low-grade adenocarcinomas.  She did have a grandmother who had lung cancer.  She continues to work.  She is currently working from home.  She has not had any change in appetite or weight loss.  She not had any recent respiratory infections.  She had pulmonary function testing 2 years ago which showed no signs of COPD.  She denies chest pain, pressure, or tightness.  She does have an occasional cough, usually worse in the morning.  Nonproductive. Zubrod Score: At the time of surgery this patient's most appropriate activity status/level should be described as: [x]     0    Normal activity, no symptoms []     1    Restricted in physical strenuous activity but ambulatory, able to do out light work []     2    Ambulatory and capable of self care, unable to do work activities, up and about >50 % of waking hours                              []     3    Only limited self care, in bed greater than 50% of waking hours []     4    Completely disabled, no self care, confined to bed or chair []     5     Moribund  Past Medical History:  Diagnosis Date  . Allergic rhinitis   . Allergy   . Anemia    prior to hysterectomy--1997  . GERD (gastroesophageal reflux disease)   . Heart murmur   . Hemorrhoid 2011  . History of shingles 04/2014    Past Surgical History:  Procedure Laterality Date  . ABDOMINAL HYSTERECTOMY  1997  . APPENDECTOMY  1960  . HEMORRHOID SURGERY  9/11   8/27 had a follow up procedure.    Marland Kitchen LIPOMA EXCISION  1975   neck, left breast and righ jaw.    Family History  Problem Relation Age of Onset  . Prostate cancer Father   . Lung cancer Maternal Grandmother   . Asthma Daughter   . Asthma Sister   . Breast cancer Brother   . Heart attack Brother     Social History Social History   Tobacco Use  . Smoking status: Never Smoker  . Smokeless tobacco: Never Used  Substance Use Topics  . Alcohol use: No    Alcohol/week: 0.0 standard drinks  . Drug use: No  Current Outpatient Medications  Medication Sig Dispense Refill  . calcium carbonate (OS-CAL) 600 MG TABS tablet Take 1,200 mg by mouth daily.    . cholecalciferol (VITAMIN D) 1000 UNITS tablet Take 2,000 Units by mouth daily.    . diclofenac sodium (VOLTAREN) 1 % GEL APPLY 3 GRAMS TO 3 LARGE JOINTS UP TO 3 TIMES DAILY. 300 g 3  . omega-3 acid ethyl esters (LOVAZA) 1 g capsule Take by mouth 2 (two) times daily.     No current facility-administered medications for this visit.     No Known Allergies  Review of Systems  Constitutional: Negative for activity change, appetite change, chills, fever and unexpected weight change.  HENT: Negative for trouble swallowing and voice change.   Eyes: Negative for visual disturbance.  Respiratory: Positive for cough. Negative for chest tightness, shortness of breath and wheezing.   Cardiovascular: Negative for chest pain, palpitations and leg swelling.  Gastrointestinal: Positive for abdominal pain (reflux). Negative for abdominal distention.  Genitourinary:  Negative for difficulty urinating and dysuria.  Musculoskeletal: Positive for arthralgias and joint swelling.  Neurological: Negative for syncope and weakness.  Hematological: Negative for adenopathy. Does not bruise/bleed easily.  All other systems reviewed and are negative.   BP 140/90   Pulse 92   Temp (!) 96.4 F (35.8 C) (Tympanic)   Resp 20   Ht 5\' 10"  (1.778 m)   Wt 204 lb (92.5 kg)   LMP 05/23/1995   SpO2 97% Comment: RA  BMI 29.27 kg/m  Physical Exam Vitals signs reviewed.  Constitutional:      General: She is not in acute distress.    Appearance: Normal appearance.  HENT:     Head: Normocephalic and atraumatic.     Comments: Wearing face mask Eyes:     General: No scleral icterus.    Extraocular Movements: Extraocular movements intact.     Conjunctiva/sclera: Conjunctivae normal.  Neck:     Musculoskeletal: Neck supple.  Cardiovascular:     Rate and Rhythm: Normal rate and regular rhythm.     Heart sounds: Murmur (2/6 systolic RUSB) present. No friction rub. No gallop.   Pulmonary:     Effort: Pulmonary effort is normal. No respiratory distress.     Breath sounds: Normal breath sounds. No wheezing or rales.  Abdominal:     General: There is no distension.     Palpations: Abdomen is soft.     Tenderness: There is no abdominal tenderness.  Musculoskeletal:        General: No swelling.  Lymphadenopathy:     Cervical: No cervical adenopathy.  Skin:    General: Skin is warm and dry.  Neurological:     General: No focal deficit present.     Mental Status: She is alert.     Cranial Nerves: No cranial nerve deficit.     Motor: No weakness.    Diagnostic Tests: CT CHEST WITHOUT CONTRAST  TECHNIQUE: Multidetector CT imaging of the chest was performed following the standard protocol without IV contrast.  COMPARISON:  07/26/2017 chest CT.  04/23/2018 chest radiograph.  FINDINGS: Cardiovascular: Normal heart size. No significant  pericardial effusion/thickening. Left anterior descending coronary atherosclerosis. Atherosclerotic nonaneurysmal thoracic aorta. Normal caliber pulmonary arteries.  Mediastinum/Nodes: Suggestion of hypodense small bilateral thyroid lobe nodules, largest 1.1 cm on the left. Unremarkable esophagus. No pathologically enlarged axillary, mediastinal or hilar lymph nodes, noting limited sensitivity for the detection of hilar adenopathy on this noncontrast study.  Lungs/Pleura: No pneumothorax. No pleural effusion.  No acute consolidative airspace disease or lung masses. Subsolid 2.0 x 1.7 cm basilar right lower lobe pulmonary nodule (series 3/image 134) with 0.4 cm solid component, previously 1.8 x 1.5 cm using similar measurement technique, mildly increased. Peripheral right lower lobe sub solid 1.6 x 1.2 cm pulmonary nodule (series 3/image 95), previously 1.6 x 1.1 cm using similar measurement technique, not appreciably changed. Numerous tiny solid pulmonary nodules scattered in both lungs measuring up to 4 mm in the anterior right lower lobe (series 3/image 95), all stable, considered benign. No new significant pulmonary nodules.  Upper abdomen: Colonic diverticulosis.  Musculoskeletal: No aggressive appearing focal osseous lesions. Moderate thoracic spondylosis.  IMPRESSION: 1. Subsolid 2.0 cm basilar right lower lobe pulmonary nodule with 0.4 cm solid component, mildly increased in size in the interval. Slow growing primary bronchogenic adenocarcinoma cannot be excluded. Multidisciplinary thoracic oncology consultation suggested. 2. Separate 1.6 cm peripheral right lower lobe subsolid pulmonary nodule, stable. 3. No thoracic adenopathy.  No pleural effusions. 4. One vessel coronary atherosclerosis.  Aortic Atherosclerosis (ICD10-I70.0).  These results will be called to the ordering clinician or representative by the Radiologist Assistant, and communication documented  in the PACS or zVision Dashboard.   Electronically Signed   By: Ilona Sorrel M.D.   On: 07/31/2018 12:26 NUCLEAR MEDICINE PET SKULL BASE TO THIGH  TECHNIQUE: 10.8 mCi F-18 FDG was injected intravenously. Full-ring PET imaging was performed from the skull base to thigh after the radiotracer. CT data was obtained and used for attenuation correction and anatomic localization.  Fasting blood glucose: 86 mg/dl  COMPARISON:  07/31/2018 chest CT.  FINDINGS: Mediastinal blood pool activity: SUV max 3.0  NECK: No hypermetabolic lymph nodes in the neck. Relatively symmetric hypermetabolism within Waldeyer's ring without CT mass correlate, nonspecific, probably physiologic/reactive.  Incidental CT findings: Hypodense non hypermetabolic 1.1 cm left thyroid lobe nodule. Mild mucoperiosteal thickening in the maxillary sinuses bilaterally. No fluid levels in the paranasal sinuses.  CHEST:  No hypermetabolic axillary, mediastinal or hilar lymph nodes.  There is low level hypermetabolism associated with the sub solid 2.1 cm basilar right lower lobe pulmonary nodule with max SUV 3.1 (series 8/image 59).  There is low level metabolism associated with the peripheral right lower lobe 1.6 cm sub solid pulmonary nodule with max SUV 2.3 (series 8/image 40).  Incidental CT findings: No acute consolidative airspace disease. Mildly atherosclerotic nonaneurysmal thoracic aorta.  ABDOMEN/PELVIS: No abnormal hypermetabolic activity within the liver, pancreas, adrenal glands, or spleen. No hypermetabolic lymph nodes in the abdomen or pelvis.  Incidental CT findings: Left adrenal 1.1 cm nodule is stable in size since 09/08/2016 CT abdomen study and demonstrates metabolism below that of the liver, compatible with a benign adenoma. Moderate diffuse colonic diverticulosis. Minimally atherosclerotic nonaneurysmal abdominal aorta.  SKELETON: No focal hypermetabolic activity to  suggest skeletal metastasis.  Incidental CT findings: none  IMPRESSION: 1. Low level hypermetabolism (max SUV 3.1) within the subsolid 2.1 cm basilar right lower lobe pulmonary nodule, which had mildly increased in size on recent chest CT. Slow growing primary bronchogenic adenocarcinoma not excluded. 2. Low level metabolism (max SUV 2.3) within the subsolid 1.6 cm peripheral right lower lobe pulmonary nodule, which was stable in size on recent chest CT. Continued chest CT surveillance suggested for this nodule. 3. No hypermetabolic thoracic adenopathy or distant metastatic disease. 4. Stable left adrenal adenoma. 5. Moderate diffuse colonic diverticulosis. 6.  Aortic Atherosclerosis (ICD10-I70.0).   Electronically Signed   By: Ilona Sorrel M.D.   On:  08/15/2018 15:43 I personally reviewed the CT and PET/CT images and concur with the findings noted above  Impression: Mrs. Stephanie Crawford is a 76 year old lifelong non-smoker who was found to have lung nodules 2 years ago on a CT of the abdomen to evaluate for kidney stones.  She is been followed since that time.  Recently there has been an increase in size of a basilar right lower lobe sub-solid nodule.  There is a second sub-solid nodule in the lateral aspect of the right lower lobe that is unchanged.  On PET/CT both of these lesions are mildly hypermetabolic.  There is no evidence of adenopathy or distant metastasis.  Differential diagnosis includes low-grade adenocarcinomas, as well as infectious and inflammatory nodules.  I recommended that we proceed with wedge resection for definitive diagnosis and treatment of the nodules.  We would do this with a VATS approach.  I described the general nature of the procedure to her including the need for general anesthesia, the incisions to be used, the use of a drainage tube postoperatively, the expected hospital stay, and the overall recovery.  I informed her of the indications, risk, benefits,  and alternatives.  She understands the risk include, but not limited to death, MI, DVT, PE, bleeding, possible need for transfusion, infection, prolonged air leak, cardiac arrhythmias, as well as possibility of other unforeseeable complications.  She accepts the risk and wishes to proceed.  We will tentatively plan for surgery on 10/09/2018.  Obviously with the current COVID situation we may have to adjust that plan.  Plan: Right VATS for wedge resection x2 of right lower lobe nodules on Wednesday, 10/09/2018  Melrose Nakayama, MD [facility] [phone]

## 2018-09-10 NOTE — H&P (View-Only) (Signed)
PCP is Schoenhoff, Altamese Cabal, MD Referring Provider is Magdalen Spatz, NP  Chief Complaint  Patient presents with  . Lung Lesion    Surgical eval, Chest CT 07/31/18, PET Scan 08/15/18, PFT's 02/09/17    HPI: Stephanie Crawford is sent for consultation regarding right lower lobe lung nodules.  Stephanie Crawford is a 73 year old woman who is a lifelong non-smoker.  She has a past medical history significant for heart murmur since childhood, allergies, anemia, and reflux.  She was found to have lung nodules back in 2018 when she presented with a kidney stone.  She has been followed with serial CT scans since then.  In March 2020 a CT scan showed an increase in size of a basilar right lower lobe lung nodule from 1.8 x 1.5 to 2.0 x 1.7 cm.  A second sub-solid nodule was stable at 1.6 x 1.2 cm.  There were numerous tiny solid nodules that were unchanged.  A PET/CT showed mild uptake in both sub-solid nodules concerning for low-grade adenocarcinomas.  She did have a grandmother who had lung cancer.  She continues to work.  She is currently working from home.  She has not had any change in appetite or weight loss.  She not had any recent respiratory infections.  She had pulmonary function testing 2 years ago which showed no signs of COPD.  She denies chest pain, pressure, or tightness.  She does have an occasional cough, usually worse in the morning.  Nonproductive. Zubrod Score: At the time of surgery this patient's most appropriate activity status/level should be described as: [x]     0    Normal activity, no symptoms []     1    Restricted in physical strenuous activity but ambulatory, able to do out light work []     2    Ambulatory and capable of self care, unable to do work activities, up and about >50 % of waking hours                              []     3    Only limited self care, in bed greater than 50% of waking hours []     4    Completely disabled, no self care, confined to bed or chair []     5     Moribund  Past Medical History:  Diagnosis Date  . Allergic rhinitis   . Allergy   . Anemia    prior to hysterectomy--1997  . GERD (gastroesophageal reflux disease)   . Heart murmur   . Hemorrhoid 2011  . History of shingles 04/2014    Past Surgical History:  Procedure Laterality Date  . ABDOMINAL HYSTERECTOMY  1997  . APPENDECTOMY  1960  . HEMORRHOID SURGERY  9/11   8/27 had a follow up procedure.    Marland Kitchen LIPOMA EXCISION  1975   neck, left breast and righ jaw.    Family History  Problem Relation Age of Onset  . Prostate cancer Father   . Lung cancer Maternal Grandmother   . Asthma Daughter   . Asthma Sister   . Breast cancer Brother   . Heart attack Brother     Social History Social History   Tobacco Use  . Smoking status: Never Smoker  . Smokeless tobacco: Never Used  Substance Use Topics  . Alcohol use: No    Alcohol/week: 0.0 standard drinks  . Drug use: No  Current Outpatient Medications  Medication Sig Dispense Refill  . calcium carbonate (OS-CAL) 600 MG TABS tablet Take 1,200 mg by mouth daily.    . cholecalciferol (VITAMIN D) 1000 UNITS tablet Take 2,000 Units by mouth daily.    . diclofenac sodium (VOLTAREN) 1 % GEL APPLY 3 GRAMS TO 3 LARGE JOINTS UP TO 3 TIMES DAILY. 300 g 3  . omega-3 acid ethyl esters (LOVAZA) 1 g capsule Take by mouth 2 (two) times daily.     No current facility-administered medications for this visit.     No Known Allergies  Review of Systems  Constitutional: Negative for activity change, appetite change, chills, fever and unexpected weight change.  HENT: Negative for trouble swallowing and voice change.   Eyes: Negative for visual disturbance.  Respiratory: Positive for cough. Negative for chest tightness, shortness of breath and wheezing.   Cardiovascular: Negative for chest pain, palpitations and leg swelling.  Gastrointestinal: Positive for abdominal pain (reflux). Negative for abdominal distention.  Genitourinary:  Negative for difficulty urinating and dysuria.  Musculoskeletal: Positive for arthralgias and joint swelling.  Neurological: Negative for syncope and weakness.  Hematological: Negative for adenopathy. Does not bruise/bleed easily.  All other systems reviewed and are negative.   BP 140/90   Pulse 92   Temp (!) 96.4 F (35.8 C) (Tympanic)   Resp 20   Ht 5\' 10"  (1.778 m)   Wt 204 lb (92.5 kg)   LMP 05/23/1995   SpO2 97% Comment: RA  BMI 29.27 kg/m  Physical Exam Vitals signs reviewed.  Constitutional:      General: She is not in acute distress.    Appearance: Normal appearance.  HENT:     Head: Normocephalic and atraumatic.     Comments: Wearing face mask Eyes:     General: No scleral icterus.    Extraocular Movements: Extraocular movements intact.     Conjunctiva/sclera: Conjunctivae normal.  Neck:     Musculoskeletal: Neck supple.  Cardiovascular:     Rate and Rhythm: Normal rate and regular rhythm.     Heart sounds: Murmur (2/6 systolic RUSB) present. No friction rub. No gallop.   Pulmonary:     Effort: Pulmonary effort is normal. No respiratory distress.     Breath sounds: Normal breath sounds. No wheezing or rales.  Abdominal:     General: There is no distension.     Palpations: Abdomen is soft.     Tenderness: There is no abdominal tenderness.  Musculoskeletal:        General: No swelling.  Lymphadenopathy:     Cervical: No cervical adenopathy.  Skin:    General: Skin is warm and dry.  Neurological:     General: No focal deficit present.     Mental Status: She is alert.     Cranial Nerves: No cranial nerve deficit.     Motor: No weakness.    Diagnostic Tests: CT CHEST WITHOUT CONTRAST  TECHNIQUE: Multidetector CT imaging of the chest was performed following the standard protocol without IV contrast.  COMPARISON:  07/26/2017 chest CT.  04/23/2018 chest radiograph.  FINDINGS: Cardiovascular: Normal heart size. No significant  pericardial effusion/thickening. Left anterior descending coronary atherosclerosis. Atherosclerotic nonaneurysmal thoracic aorta. Normal caliber pulmonary arteries.  Mediastinum/Nodes: Suggestion of hypodense small bilateral thyroid lobe nodules, largest 1.1 cm on the left. Unremarkable esophagus. No pathologically enlarged axillary, mediastinal or hilar lymph nodes, noting limited sensitivity for the detection of hilar adenopathy on this noncontrast study.  Lungs/Pleura: No pneumothorax. No pleural effusion.  No acute consolidative airspace disease or lung masses. Subsolid 2.0 x 1.7 cm basilar right lower lobe pulmonary nodule (series 3/image 134) with 0.4 cm solid component, previously 1.8 x 1.5 cm using similar measurement technique, mildly increased. Peripheral right lower lobe sub solid 1.6 x 1.2 cm pulmonary nodule (series 3/image 95), previously 1.6 x 1.1 cm using similar measurement technique, not appreciably changed. Numerous tiny solid pulmonary nodules scattered in both lungs measuring up to 4 mm in the anterior right lower lobe (series 3/image 95), all stable, considered benign. No new significant pulmonary nodules.  Upper abdomen: Colonic diverticulosis.  Musculoskeletal: No aggressive appearing focal osseous lesions. Moderate thoracic spondylosis.  IMPRESSION: 1. Subsolid 2.0 cm basilar right lower lobe pulmonary nodule with 0.4 cm solid component, mildly increased in size in the interval. Slow growing primary bronchogenic adenocarcinoma cannot be excluded. Multidisciplinary thoracic oncology consultation suggested. 2. Separate 1.6 cm peripheral right lower lobe subsolid pulmonary nodule, stable. 3. No thoracic adenopathy.  No pleural effusions. 4. One vessel coronary atherosclerosis.  Aortic Atherosclerosis (ICD10-I70.0).  These results will be called to the ordering clinician or representative by the Radiologist Assistant, and communication documented  in the PACS or zVision Dashboard.   Electronically Signed   By: Ilona Sorrel M.D.   On: 07/31/2018 12:26 NUCLEAR MEDICINE PET SKULL BASE TO THIGH  TECHNIQUE: 10.8 mCi F-18 FDG was injected intravenously. Full-ring PET imaging was performed from the skull base to thigh after the radiotracer. CT data was obtained and used for attenuation correction and anatomic localization.  Fasting blood glucose: 86 mg/dl  COMPARISON:  07/31/2018 chest CT.  FINDINGS: Mediastinal blood pool activity: SUV max 3.0  NECK: No hypermetabolic lymph nodes in the neck. Relatively symmetric hypermetabolism within Waldeyer's ring without CT mass correlate, nonspecific, probably physiologic/reactive.  Incidental CT findings: Hypodense non hypermetabolic 1.1 cm left thyroid lobe nodule. Mild mucoperiosteal thickening in the maxillary sinuses bilaterally. No fluid levels in the paranasal sinuses.  CHEST:  No hypermetabolic axillary, mediastinal or hilar lymph nodes.  There is low level hypermetabolism associated with the sub solid 2.1 cm basilar right lower lobe pulmonary nodule with max SUV 3.1 (series 8/image 59).  There is low level metabolism associated with the peripheral right lower lobe 1.6 cm sub solid pulmonary nodule with max SUV 2.3 (series 8/image 40).  Incidental CT findings: No acute consolidative airspace disease. Mildly atherosclerotic nonaneurysmal thoracic aorta.  ABDOMEN/PELVIS: No abnormal hypermetabolic activity within the liver, pancreas, adrenal glands, or spleen. No hypermetabolic lymph nodes in the abdomen or pelvis.  Incidental CT findings: Left adrenal 1.1 cm nodule is stable in size since 09/08/2016 CT abdomen study and demonstrates metabolism below that of the liver, compatible with a benign adenoma. Moderate diffuse colonic diverticulosis. Minimally atherosclerotic nonaneurysmal abdominal aorta.  SKELETON: No focal hypermetabolic activity to  suggest skeletal metastasis.  Incidental CT findings: none  IMPRESSION: 1. Low level hypermetabolism (max SUV 3.1) within the subsolid 2.1 cm basilar right lower lobe pulmonary nodule, which had mildly increased in size on recent chest CT. Slow growing primary bronchogenic adenocarcinoma not excluded. 2. Low level metabolism (max SUV 2.3) within the subsolid 1.6 cm peripheral right lower lobe pulmonary nodule, which was stable in size on recent chest CT. Continued chest CT surveillance suggested for this nodule. 3. No hypermetabolic thoracic adenopathy or distant metastatic disease. 4. Stable left adrenal adenoma. 5. Moderate diffuse colonic diverticulosis. 6.  Aortic Atherosclerosis (ICD10-I70.0).   Electronically Signed   By: Ilona Sorrel M.D.   On:  08/15/2018 15:43 I personally reviewed the CT and PET/CT images and concur with the findings noted above  Impression: Stephanie Crawford is a 61 year old lifelong non-smoker who was found to have lung nodules 2 years ago on a CT of the abdomen to evaluate for kidney stones.  She is been followed since that time.  Recently there has been an increase in size of a basilar right lower lobe sub-solid nodule.  There is a second sub-solid nodule in the lateral aspect of the right lower lobe that is unchanged.  On PET/CT both of these lesions are mildly hypermetabolic.  There is no evidence of adenopathy or distant metastasis.  Differential diagnosis includes low-grade adenocarcinomas, as well as infectious and inflammatory nodules.  I recommended that we proceed with wedge resection for definitive diagnosis and treatment of the nodules.  We would do this with a VATS approach.  I described the general nature of the procedure to her including the need for general anesthesia, the incisions to be used, the use of a drainage tube postoperatively, the expected hospital stay, and the overall recovery.  I informed her of the indications, risk, benefits,  and alternatives.  She understands the risk include, but not limited to death, MI, DVT, PE, bleeding, possible need for transfusion, infection, prolonged air leak, cardiac arrhythmias, as well as possibility of other unforeseeable complications.  She accepts the risk and wishes to proceed.  We will tentatively plan for surgery on 10/09/2018.  Obviously with the current COVID situation we may have to adjust that plan.  Plan: Right VATS for wedge resection x2 of right lower lobe nodules on Wednesday, 10/09/2018  Melrose Nakayama, MD [facility] [phone]

## 2018-09-11 ENCOUNTER — Encounter: Payer: Self-pay | Admitting: *Deleted

## 2018-09-11 ENCOUNTER — Other Ambulatory Visit: Payer: Self-pay | Admitting: *Deleted

## 2018-09-11 DIAGNOSIS — R918 Other nonspecific abnormal finding of lung field: Secondary | ICD-10-CM

## 2018-09-19 ENCOUNTER — Other Ambulatory Visit: Payer: Self-pay | Admitting: *Deleted

## 2018-09-19 DIAGNOSIS — R911 Solitary pulmonary nodule: Secondary | ICD-10-CM

## 2018-10-07 ENCOUNTER — Ambulatory Visit (HOSPITAL_COMMUNITY)
Admission: RE | Admit: 2018-10-07 | Discharge: 2018-10-07 | Disposition: A | Discharge status: Home / Self Care | Payer: BLUE CROSS/BLUE SHIELD | Source: Ambulatory Visit | Attending: Thoracic Surgery (Cardiothoracic Vascular Surgery) | Admitting: Thoracic Surgery (Cardiothoracic Vascular Surgery)

## 2018-10-07 ENCOUNTER — Encounter (HOSPITAL_COMMUNITY): Payer: Self-pay

## 2018-10-07 ENCOUNTER — Other Ambulatory Visit: Payer: Self-pay

## 2018-10-07 ENCOUNTER — Other Ambulatory Visit (HOSPITAL_COMMUNITY)
Admission: RE | Admit: 2018-10-07 | Discharge: 2018-10-07 | Disposition: A | Discharge status: Home / Self Care | Payer: BLUE CROSS/BLUE SHIELD | Source: Ambulatory Visit | Attending: Thoracic Surgery (Cardiothoracic Vascular Surgery) | Admitting: Thoracic Surgery (Cardiothoracic Vascular Surgery)

## 2018-10-07 ENCOUNTER — Encounter (HOSPITAL_COMMUNITY)
Admission: RE | Admit: 2018-10-07 | Discharge: 2018-10-07 | Disposition: A | Discharge status: Home / Self Care | Payer: BLUE CROSS/BLUE SHIELD | Source: Ambulatory Visit | Attending: Thoracic Surgery (Cardiothoracic Vascular Surgery) | Admitting: Thoracic Surgery (Cardiothoracic Vascular Surgery)

## 2018-10-07 DIAGNOSIS — Z01818 Encounter for other preprocedural examination: Secondary | ICD-10-CM | POA: Insufficient documentation

## 2018-10-07 DIAGNOSIS — Z1159 Encounter for screening for other viral diseases: Secondary | ICD-10-CM | POA: Insufficient documentation

## 2018-10-07 DIAGNOSIS — R918 Other nonspecific abnormal finding of lung field: Secondary | ICD-10-CM | POA: Insufficient documentation

## 2018-10-07 DIAGNOSIS — J984 Other disorders of lung: Secondary | ICD-10-CM | POA: Diagnosis not present

## 2018-10-07 DIAGNOSIS — R911 Solitary pulmonary nodule: Secondary | ICD-10-CM

## 2018-10-07 LAB — COMPREHENSIVE METABOLIC PANEL
ALT: 16 U/L (ref 0–44)
AST: 18 U/L (ref 15–41)
Albumin: 3.9 g/dL (ref 3.5–5.0)
Alkaline Phosphatase: 88 U/L (ref 38–126)
Anion gap: 12 (ref 5–15)
BUN: 8 mg/dL (ref 8–23)
CO2: 22 mmol/L (ref 22–32)
Calcium: 9.4 mg/dL (ref 8.9–10.3)
Chloride: 104 mmol/L (ref 98–111)
Creatinine, Ser: 0.8 mg/dL (ref 0.44–1.00)
GFR calc Af Amer: >60 mL/min (ref >60)
GFR calc non Af Amer: >60 mL/min (ref >60)
Glucose, Bld: 96 mg/dL (ref 70–99)
Potassium: 3.7 mmol/L (ref 3.5–5.1)
Sodium: 138 mmol/L (ref 135–145)
Total Bilirubin: 1.2 mg/dL (ref 0.3–1.2)
Total Protein: 6.8 g/dL (ref 6.5–8.1)

## 2018-10-07 LAB — URINALYSIS, ROUTINE W REFLEX MICROSCOPIC
Bilirubin Urine: NEGATIVE
Glucose, UA: NEGATIVE mg/dL
Hgb urine dipstick: NEGATIVE
Ketones, ur: NEGATIVE mg/dL
Leukocytes,Ua: NEGATIVE
Nitrite: NEGATIVE
Protein, ur: NEGATIVE mg/dL
Specific Gravity, Urine: 1.012 (ref 1.005–1.030)
pH: 6 (ref 5.0–8.0)

## 2018-10-07 LAB — CBC
HCT: 40 % (ref 36.0–46.0)
Hemoglobin: 13.3 g/dL (ref 12.0–15.0)
MCH: 30.2 pg (ref 26.0–34.0)
MCHC: 33.3 g/dL (ref 30.0–36.0)
MCV: 90.7 fL (ref 80.0–100.0)
Platelets: 289 10*3/uL (ref 150–400)
RBC: 4.41 MIL/uL (ref 3.87–5.11)
RDW: 12.1 % (ref 11.5–15.5)
WBC: 5.8 10*3/uL (ref 4.0–10.5)
nRBC: 0 % (ref 0.0–0.2)

## 2018-10-07 LAB — PROTIME-INR
INR: 1 (ref 0.8–1.2)
Prothrombin Time: 13.2 seconds (ref 11.4–15.2)

## 2018-10-07 LAB — BLOOD GAS, ARTERIAL
Acid-Base Excess: 1.1 mmol/L (ref 0.0–2.0)
Bicarbonate: 24.7 mmol/L (ref 20.0–28.0)
Drawn by: 42180
FIO2: 21
O2 Saturation: 96 %
Patient temperature: 98.6
pCO2 arterial: 35.7 mmHg (ref 32.0–48.0)
pH, Arterial: 7.454 — ABNORMAL HIGH (ref 7.350–7.450)
pO2, Arterial: 83.4 mmHg (ref 83.0–108.0)

## 2018-10-07 LAB — APTT: aPTT: 39 seconds — ABNORMAL HIGH (ref 24–36)

## 2018-10-07 LAB — SURGICAL PCR SCREEN
MRSA, PCR: POSITIVE — AB
Staphylococcus aureus: POSITIVE — AB

## 2018-10-07 LAB — ABO/RH: ABO/RH(D): A POS

## 2018-10-07 LAB — TYPE AND SCREEN
ABO/RH(D): A POS
Antibody Screen: NEGATIVE

## 2018-10-07 NOTE — Progress Notes (Signed)
PCP - Dr Emi Belfast  Cardiologist - denies  Chest x-ray - 10-07-18  EKG - 10-07-18  Stress Test - 03-15-11(CE)  ECHO - .20 yrs in [place]  Sleep Study - denies CPAP - NA  LABS-CBC,CMP,ABG,PT-INR,APTT, UA ,T/S  ASA-denies (last dose >56mth)  ERAS-NA  HA1C-denies Fasting Blood Sugar -  Checks Blood Sugar _0____ times a day  Anesthesia-Y. Childhood h/o murmur.  Pt denies having chest pain, sob, or fever at this time. All instructions explained to the pt, with a verbal understanding of the material. Pt agrees to go over the instructions while at home for a better understanding. The opportunity to ask questions was provided.

## 2018-10-07 NOTE — Progress Notes (Signed)
I called a prescription for Mupirocin ointment to [facility].

## 2018-10-07 NOTE — Progress Notes (Addendum)
  Coronavirus Screening  Have you experienced the following symptoms:  Cough yes/no: No Fever (>100.50F)  yes/no: No Runny nose yes/no: No Sore throat yes/no: No Difficulty breathing/shortness of breath  yes/no: No Loss of sense of smell or taste-No Have you or a family member traveled in the last 14 days and where? yes/no: No  Pt  completed her COVID test at [place] and then visited her Drs office to drop some papers off before coming to this pre-op appt. Pt advised to self quarantine at home up until day of surgery after this appt. If the patient indicates "YES" to the above questions, their PAT will be rescheduled to limit the exposure to others and, the surgeon will be notified. THE PATIENT WILL NEED TO BE ASYMPTOMATIC FOR 14 DAYS.   If the patient is not experiencing any of these symptoms, the PAT nurse will instruct them to NOT bring anyone with them to their appointment since they may have these symptoms or traveled as well.   Please remind your patients and families that hospital visitation restrictions are in effect and the importance of the restrictions.

## 2018-10-07 NOTE — Pre-Procedure Instructions (Signed)
Stephanie Crawford  10/07/2018    Your procedure is scheduled on Wednesday, May 20..  Report to [place], [facility] Entrance A - register in the Admitting office at 6:30  A.M.                 Your surgery or procedure is scheduled for 8:30 AM   Call this number if you have problems the morning of surgery: [phone]  This is the number for the Pre- Surgical Desk.    Remember:  Do not eat or drink after midnight Tuesday, May 19.   Take these medicines the morning of surgery with A SIP OF WATER: None              May use eyedrops if needed. Follow your surgeon's instructions regarding holding or continuing Aspirin.  Stop taking Aspirin Products,  Plavix, Effient and Herbal medications.  Don not take any NSAIDs ie: Ibuprofen,  Advil,Naproxen or any medication containing Aspirin.  Special instructions:  [place]- Preparing For Surgery  Before surgery, you can play an important role. Because skin is not sterile, your skin needs to be as free of germs as possible. You can reduce the number of germs on your skin by washing with CHG (chlorahexidine gluconate) Soap before surgery.  CHG is an antiseptic cleaner which kills germs and bonds with the skin to continue killing germs even after washing.    Oral Hygiene is also important to reduce your risk of infection.  Remember - BRUSH YOUR TEETH THE MORNING OF SURGERY WITH YOUR REGULAR TOOTHPASTE  Please do not use if you have an allergy to CHG or antibacterial soaps. If your skin becomes reddened/irritated stop using the CHG.  Do not shave (including legs and underarms) for at least 48 hours prior to first CHG shower. It is OK to shave your face.  Please follow these instructions carefully.   1. Shower the NIGHT BEFORE SURGERY and the MORNING OF SURGERY with CHG.   2. If you chose to wash your hair, wash your hair first as usual with your normal shampoo.  3. After you shampoo, wash your face and private area  with the soap you use at home, then rinse your hair and body thoroughly to remove the shampoo and soap..  4. Use CHG as you would any other liquid soap. You can apply CHG directly to the skin and wash gently with a scrungie or a clean washcloth.   5. Apply the CHG Soap to your body ONLY FROM THE NECK DOWN.  Do not use on open wounds or open sores. Avoid contact with your eyes, ears, mouth and genitals (private parts).   6. Wash thoroughly, paying special attention to the area where your surgery will be performed.  7. Thoroughly rinse your body with warm water from the neck down.  8. DO NOT shower/wash with your normal soap after using and rinsing off the CHG Soap.  9. Pat yourself dry with a CLEAN TOWEL.  10. Wear CLEAN PAJAMAS to bed the night before surgery, wear comfortable clothes the morning of surgery  11. Place CLEAN SHEETS on your bed the night of your first shower and DO NOT SLEEP WITH PETS.  Day of Surgery: Shower as instructed above Do not apply any deodorants/lotions, powders or colognes.  Please wear clean clothes to the hospital/surgery center.   Remember to brush your teeth WITH YOUR REGULAR TOOTHPASTE.  Do not wear jewelry, make-up or nail polish.  Do not shave 48 hours prior to surgery.  Do not bring valuables to the hospital.  [facility] is not responsible for any belongings or valuables.  Contacts, dentures or bridgework may not be worn into surgery.  Leave your suitcase in the car.  After surgery it may be brought to your room.  For patients admitted to the hospital, discharge time will be determined by your treatment team.  Patients discharged the day of surgery will not be allowed to drive home.   Please read over the following fact sheets that you were given:  Patient Instructions for Mupirocin Application, Incentive Spirometry, Surgical Site Infections.

## 2018-10-07 NOTE — Pre-Procedure Instructions (Signed)
Your procedure is scheduled on Wednesday, May 20 from 0830-1130am Report to [facility], Entrance A -6:30 A.M.  Call this number if you have problems the morning of surgery: [phone]    Remember:  Do not eat or drink after midnight Tuesday, May 19.   Take these medicines the morning of surgery with A SIP OF WATER: None   May use eyedrops if needed.  Follow your surgeon's instructions regarding holding or continuing Aspirin. As of today, STOP taking any Aspirin (unless otherwise instructed by your surgeon), Aleve, Naproxen, Ibuprofen, Motrin, Advil, Goody's, BC's, all herbal medications, fish oil, and all vitamins.  If you are a smoker, do not smoke 24 hrs prior to surgery.  Special instructions:  [place]- Preparing For Surgery  Before surgery, you can play an important role. Because skin is not sterile, your skin needs to be as free of germs as possible. You can reduce the number of germs on your skin by washing with CHG (chlorahexidine gluconate) Soap before surgery.  CHG is an antiseptic cleaner which kills germs and bonds with the skin to continue killing germs even after washing.    Oral Hygiene is also important to reduce your risk of infection.  Remember - BRUSH YOUR TEETH THE MORNING OF SURGERY WITH YOUR REGULAR TOOTHPASTE  Please do not use if you have an allergy to CHG or antibacterial soaps. If your skin becomes reddened/irritated stop using the CHG.  Do not shave (including legs and underarms) for at least 48 hours prior to first CHG shower. It is OK to shave your face.  Please follow these instructions carefully.   1. Shower the NIGHT BEFORE SURGERY and the MORNING OF SURGERY with CHG.   2. If you chose to wash your hair, wash your hair first as usual with your normal shampoo.  3. After you shampoo, wash your face and private area with the soap you use at home, then rinse your hair and body thoroughly to remove the shampoo and  soap..  4. Use CHG as you would any other liquid soap. You can apply CHG directly to the skin and wash gently with a scrungie or a clean washcloth.   5. Apply the CHG Soap to your body ONLY FROM THE NECK DOWN.  Do not use on open wounds or open sores. Avoid contact with your eyes, ears, mouth and genitals (private parts).   6. Wash thoroughly, paying special attention to the area where your surgery will be performed.  7. Thoroughly rinse your body with warm water from the neck down.  8. DO NOT shower/wash with your normal soap after using and rinsing off the CHG Soap.  9. Pat yourself dry with a CLEAN TOWEL.  10. Wear CLEAN PAJAMAS to bed the night before surgery, wear comfortable clothes the morning of surgery  11. Place CLEAN SHEETS on your bed the night of your first shower and DO NOT SLEEP WITH PETS.  Day of Surgery: Shower as instructed above Do not apply any deodorants/lotions, powders or colognes.  Please wear clean clothes to the hospital/surgery center.   Remember to brush your teeth WITH YOUR REGULAR TOOTHPASTE.  Do not wear jewelry, make-up or nail polish.  Do not shave 48 hours prior to surgery.  Do not bring valuables to the hospital.  [facility] is not responsible for any belongings or valuables.  Contacts, dentures or bridgework may not be worn into surgery.  Leave your suitcase in the car.  After  surgery it may be brought to your room.  For patients admitted to the hospital, discharge time will be determined by your treatment team.  Patients discharged the day of surgery will not be allowed to drive home. Remember that you must have someone to transport you home after surgery,and remain with you for 24 hrs if you are discharged the same day.  Please read over the following fact sheets that you were given:  Patient Instructions for Mupirocin Application, Incentive Spirometry, Surgical Site Infections.

## 2018-10-08 LAB — NOVEL CORONAVIRUS, NAA (HOSP ORDER, SEND-OUT TO REF LAB; TAT 18-24 HRS): SARS-CoV-2, NAA: NOT DETECTED

## 2018-10-08 MED ORDER — CEFAZOLIN SODIUM-DEXTROSE 2-4 GM/100ML-% IV SOLN
2.0000 g | INTRAVENOUS | Status: AC
  Administered 2018-10-09: 09:00:00 2 g via INTRAVENOUS
  Filled 2018-10-08: qty 100

## 2018-10-08 MED ORDER — VANCOMYCIN HCL 10 G IV SOLR
1500.0000 mg | INTRAVENOUS | Status: AC
  Administered 2018-10-09: 07:00:00 1500 mg via INTRAVENOUS
  Filled 2018-10-08: qty 1500

## 2018-10-08 NOTE — Anesthesia Preprocedure Evaluation (Addendum)
Anesthesia Evaluation  Patient identified by MRN, date of birth, ID band Patient awake    Reviewed: Allergy & Precautions, NPO status , Patient's Chart, lab work & pertinent test results  History of Anesthesia Complications Negative for: history of anesthetic complications  Airway Mallampati: II  TM Distance: >3 FB Neck ROM: Full    Dental  (+) Teeth Intact   Pulmonary  RLL LUNG NODULES   breath sounds clear to auscultation       Cardiovascular negative cardio ROS   Rhythm:Regular     Neuro/Psych negative neurological ROS  negative psych ROS   GI/Hepatic Neg liver ROS, GERD  Controlled,  Endo/Other  negative endocrine ROS  Renal/GU negative Renal ROS     Musculoskeletal  (+) Arthritis ,   Abdominal   Peds  Hematology negative hematology ROS (+)   Anesthesia Other Findings   Reproductive/Obstetrics                             Anesthesia Physical Anesthesia Plan  ASA: II  Anesthesia Plan: General   Post-op Pain Management:    Induction: Intravenous  PONV Risk Score and Plan: 3 and Ondansetron and Dexamethasone  Airway Management Planned: Double Lumen EBT  Additional Equipment: Arterial line  Intra-op Plan:   Post-operative Plan: Extubation in OR  Informed Consent: I have reviewed the patients History and Physical, chart, labs and discussed the procedure including the risks, benefits and alternatives for the proposed anesthesia with the patient or authorized representative who has indicated his/her understanding and acceptance.     Dental advisory given  Plan Discussed with: CRNA and Surgeon  Anesthesia Plan Comments: (Pt states murmur was identified by PCP ~20 years ago and workup at that time was benign (recalls having an echo). She reports her mother told her she had a benign murmur as a child. She denies any new CV symptoms. She has no other cardiac history.  She did  have a nuclear stress test in 2012 for eval of abnormal EKG that showed EF: 79%, Good exercise capacity, No significant ST segment change suggestive of ischemia, Normal stress nuclear study.)      Anesthesia Quick Evaluation

## 2018-10-09 ENCOUNTER — Other Ambulatory Visit: Payer: Self-pay

## 2018-10-09 ENCOUNTER — Encounter (HOSPITAL_COMMUNITY)
Admission: RE | Disposition: A | Discharge status: Home / Self Care | Payer: Self-pay | Source: Home / Self Care | Attending: Thoracic Surgery (Cardiothoracic Vascular Surgery)

## 2018-10-09 ENCOUNTER — Inpatient Hospital Stay (HOSPITAL_COMMUNITY)
Admission: RE | Admit: 2018-10-09 | Discharge: 2018-10-13 | DRG: 165 | Disposition: A | Discharge status: Home / Self Care | Payer: BC Managed Care – PPO | Attending: Thoracic Surgery (Cardiothoracic Vascular Surgery) | Admitting: Thoracic Surgery (Cardiothoracic Vascular Surgery)

## 2018-10-09 ENCOUNTER — Inpatient Hospital Stay (HOSPITAL_COMMUNITY): Payer: BC Managed Care – PPO | Admitting: Certified Registered Nurse Anesthetist

## 2018-10-09 ENCOUNTER — Inpatient Hospital Stay (HOSPITAL_COMMUNITY): Payer: BC Managed Care – PPO

## 2018-10-09 ENCOUNTER — Inpatient Hospital Stay (HOSPITAL_COMMUNITY): Payer: BC Managed Care – PPO | Admitting: Physician Assistant

## 2018-10-09 ENCOUNTER — Encounter (HOSPITAL_COMMUNITY): Payer: Self-pay

## 2018-10-09 DIAGNOSIS — R911 Solitary pulmonary nodule: Secondary | ICD-10-CM | POA: Diagnosis not present

## 2018-10-09 DIAGNOSIS — J90 Pleural effusion, not elsewhere classified: Secondary | ICD-10-CM | POA: Diagnosis not present

## 2018-10-09 DIAGNOSIS — Z4682 Encounter for fitting and adjustment of non-vascular catheter: Secondary | ICD-10-CM

## 2018-10-09 DIAGNOSIS — C3431 Malignant neoplasm of lower lobe, right bronchus or lung: Secondary | ICD-10-CM | POA: Diagnosis not present

## 2018-10-09 DIAGNOSIS — Z902 Acquired absence of lung [part of]: Secondary | ICD-10-CM | POA: Diagnosis not present

## 2018-10-09 DIAGNOSIS — E785 Hyperlipidemia, unspecified: Secondary | ICD-10-CM | POA: Diagnosis not present

## 2018-10-09 DIAGNOSIS — M5136 Other intervertebral disc degeneration, lumbar region: Secondary | ICD-10-CM | POA: Diagnosis not present

## 2018-10-09 DIAGNOSIS — Z8042 Family history of malignant neoplasm of prostate: Secondary | ICD-10-CM | POA: Diagnosis not present

## 2018-10-09 DIAGNOSIS — Z8249 Family history of ischemic heart disease and other diseases of the circulatory system: Secondary | ICD-10-CM | POA: Diagnosis not present

## 2018-10-09 DIAGNOSIS — K219 Gastro-esophageal reflux disease without esophagitis: Secondary | ICD-10-CM | POA: Diagnosis present

## 2018-10-09 DIAGNOSIS — R918 Other nonspecific abnormal finding of lung field: Secondary | ICD-10-CM | POA: Diagnosis not present

## 2018-10-09 DIAGNOSIS — D649 Anemia, unspecified: Secondary | ICD-10-CM | POA: Diagnosis not present

## 2018-10-09 DIAGNOSIS — J309 Allergic rhinitis, unspecified: Secondary | ICD-10-CM | POA: Diagnosis present

## 2018-10-09 DIAGNOSIS — Z803 Family history of malignant neoplasm of breast: Secondary | ICD-10-CM | POA: Diagnosis not present

## 2018-10-09 DIAGNOSIS — J9811 Atelectasis: Secondary | ICD-10-CM | POA: Diagnosis not present

## 2018-10-09 DIAGNOSIS — M170 Bilateral primary osteoarthritis of knee: Secondary | ICD-10-CM | POA: Diagnosis not present

## 2018-10-09 DIAGNOSIS — C3492 Malignant neoplasm of unspecified part of left bronchus or lung: Secondary | ICD-10-CM | POA: Diagnosis not present

## 2018-10-09 DIAGNOSIS — Z09 Encounter for follow-up examination after completed treatment for conditions other than malignant neoplasm: Secondary | ICD-10-CM

## 2018-10-09 DIAGNOSIS — M19041 Primary osteoarthritis, right hand: Secondary | ICD-10-CM | POA: Diagnosis not present

## 2018-10-09 DIAGNOSIS — I700 Atherosclerosis of aorta: Secondary | ICD-10-CM | POA: Diagnosis not present

## 2018-10-09 DIAGNOSIS — E559 Vitamin D deficiency, unspecified: Secondary | ICD-10-CM | POA: Diagnosis not present

## 2018-10-09 DIAGNOSIS — Z825 Family history of asthma and other chronic lower respiratory diseases: Secondary | ICD-10-CM

## 2018-10-09 DIAGNOSIS — Z801 Family history of malignant neoplasm of trachea, bronchus and lung: Secondary | ICD-10-CM

## 2018-10-09 DIAGNOSIS — M19042 Primary osteoarthritis, left hand: Secondary | ICD-10-CM | POA: Diagnosis not present

## 2018-10-09 DIAGNOSIS — I2510 Atherosclerotic heart disease of native coronary artery without angina pectoris: Secondary | ICD-10-CM | POA: Diagnosis not present

## 2018-10-09 HISTORY — DX: Solitary pulmonary nodule: R91.1

## 2018-10-09 HISTORY — PX: VIDEO ASSISTED THORACOSCOPY (VATS)/WEDGE RESECTION: SHX6174

## 2018-10-09 SURGERY — VIDEO ASSISTED THORACOSCOPY (VATS)/WEDGE RESECTION
Anesthesia: General | Site: Chest | Laterality: Right

## 2018-10-09 MED ORDER — 0.9 % SODIUM CHLORIDE (POUR BTL) OPTIME
TOPICAL | Status: DC | PRN
  Administered 2018-10-09: 2000 mL

## 2018-10-09 MED ORDER — METOCLOPRAMIDE HCL 5 MG/ML IJ SOLN
10.0000 mg | Freq: Four times a day (QID) | INTRAMUSCULAR | Status: AC
  Administered 2018-10-09 – 2018-10-10 (×4): 10 mg via INTRAVENOUS
  Filled 2018-10-09 (×4): qty 2

## 2018-10-09 MED ORDER — SUGAMMADEX SODIUM 200 MG/2ML IV SOLN
INTRAVENOUS | Status: DC | PRN
  Administered 2018-10-09: 200 mg via INTRAVENOUS

## 2018-10-09 MED ORDER — OXYCODONE HCL 5 MG PO TABS
5.0000 mg | ORAL_TABLET | ORAL | Status: DC | PRN
  Administered 2018-10-09: 19:00:00 10 mg via ORAL
  Administered 2018-10-12 (×2): 5 mg via ORAL
  Filled 2018-10-09: qty 2
  Filled 2018-10-09 (×3): qty 1
  Filled 2018-10-09: qty 2

## 2018-10-09 MED ORDER — DIPHENHYDRAMINE HCL 50 MG/ML IJ SOLN: 12.5000 mg | Freq: Four times a day (QID) | INTRAMUSCULAR | Status: DC | PRN

## 2018-10-09 MED ORDER — OXYCODONE HCL 5 MG/5ML PO SOLN: 5.0000 mg | Freq: Once | ORAL | Status: DC | PRN

## 2018-10-09 MED ORDER — CHLORHEXIDINE GLUCONATE CLOTH 2 % EX PADS
6.0000 | MEDICATED_PAD | Freq: Every day | CUTANEOUS | Status: DC
  Administered 2018-10-10 – 2018-10-12 (×3): 6 via TOPICAL

## 2018-10-09 MED ORDER — ROCURONIUM BROMIDE 10 MG/ML (PF) SYRINGE
PREFILLED_SYRINGE | INTRAVENOUS | Status: DC | PRN
  Administered 2018-10-09 (×2): 20 mg via INTRAVENOUS
  Administered 2018-10-09: 80 mg via INTRAVENOUS

## 2018-10-09 MED ORDER — ACETAMINOPHEN 160 MG/5ML PO SOLN: 1000.0000 mg | Freq: Once | ORAL | Status: DC | PRN

## 2018-10-09 MED ORDER — LIDOCAINE 2% (20 MG/ML) 5 ML SYRINGE
INTRAMUSCULAR | Status: AC
  Filled 2018-10-09: qty 10

## 2018-10-09 MED ORDER — FENTANYL CITRATE (PF) 250 MCG/5ML IJ SOLN
INTRAMUSCULAR | Status: AC
  Filled 2018-10-09: qty 5

## 2018-10-09 MED ORDER — BISACODYL 5 MG PO TBEC
10.0000 mg | DELAYED_RELEASE_TABLET | Freq: Every day | ORAL | Status: DC
  Administered 2018-10-09 – 2018-10-13 (×5): 10 mg via ORAL
  Filled 2018-10-09 (×5): qty 2

## 2018-10-09 MED ORDER — SUCCINYLCHOLINE CHLORIDE 200 MG/10ML IV SOSY
PREFILLED_SYRINGE | INTRAVENOUS | Status: AC
  Filled 2018-10-09: qty 20

## 2018-10-09 MED ORDER — ACETAMINOPHEN 160 MG/5ML PO SOLN: 1000.0000 mg | Freq: Four times a day (QID) | ORAL | Status: DC

## 2018-10-09 MED ORDER — VANCOMYCIN HCL IN DEXTROSE 1-5 GM/200ML-% IV SOLN
1000.0000 mg | Freq: Two times a day (BID) | INTRAVENOUS | Status: AC
  Administered 2018-10-09: 20:00:00 1000 mg via INTRAVENOUS
  Filled 2018-10-09: qty 200

## 2018-10-09 MED ORDER — SUCCINYLCHOLINE CHLORIDE 200 MG/10ML IV SOSY
PREFILLED_SYRINGE | INTRAVENOUS | Status: DC | PRN
  Administered 2018-10-09: 100 mg via INTRAVENOUS

## 2018-10-09 MED ORDER — LEVALBUTEROL HCL 0.63 MG/3ML IN NEBU
0.6300 mg | INHALATION_SOLUTION | Freq: Four times a day (QID) | RESPIRATORY_TRACT | Status: DC
  Administered 2018-10-09: 20:00:00 0.63 mg via RESPIRATORY_TRACT
  Filled 2018-10-09: qty 3

## 2018-10-09 MED ORDER — PROPOFOL 10 MG/ML IV BOLUS
INTRAVENOUS | Status: AC
  Filled 2018-10-09: qty 20

## 2018-10-09 MED ORDER — ENOXAPARIN SODIUM 40 MG/0.4ML ~~LOC~~ SOLN: 40.0000 mg | SUBCUTANEOUS | Status: DC

## 2018-10-09 MED ORDER — LACTATED RINGERS IV SOLN
INTRAVENOUS | Status: DC | PRN
  Administered 2018-10-09: 08:00:00 via INTRAVENOUS

## 2018-10-09 MED ORDER — ONDANSETRON HCL 4 MG/2ML IJ SOLN: 4.0000 mg | Freq: Four times a day (QID) | INTRAMUSCULAR | Status: DC | PRN

## 2018-10-09 MED ORDER — ACETAMINOPHEN 10 MG/ML IV SOLN: 1000.0000 mg | Freq: Once | INTRAVENOUS | Status: DC | PRN

## 2018-10-09 MED ORDER — MUPIROCIN 2 % EX OINT
TOPICAL_OINTMENT | CUTANEOUS | Status: AC
  Filled 2018-10-09: qty 22

## 2018-10-09 MED ORDER — ACETAMINOPHEN 500 MG PO TABS
1000.0000 mg | ORAL_TABLET | Freq: Four times a day (QID) | ORAL | Status: DC
  Administered 2018-10-09 – 2018-10-13 (×14): 1000 mg via ORAL
  Filled 2018-10-09 (×14): qty 2

## 2018-10-09 MED ORDER — ONDANSETRON HCL 4 MG/2ML IJ SOLN
INTRAMUSCULAR | Status: DC | PRN
  Administered 2018-10-09: 4 mg via INTRAVENOUS

## 2018-10-09 MED ORDER — ACETAMINOPHEN 500 MG PO TABS: 1000.0000 mg | ORAL_TABLET | Freq: Once | ORAL | Status: DC | PRN

## 2018-10-09 MED ORDER — SODIUM CHLORIDE (PF) 0.9 % IJ SOLN
INTRAMUSCULAR | Status: DC | PRN
  Administered 2018-10-09: 50 mL via INTRAVENOUS

## 2018-10-09 MED ORDER — KETOTIFEN FUMARATE 0.025 % OP SOLN: 1.0000 [drp] | Freq: Two times a day (BID) | OPHTHALMIC | Status: DC | PRN

## 2018-10-09 MED ORDER — FENTANYL CITRATE (PF) 250 MCG/5ML IJ SOLN
INTRAMUSCULAR | Status: DC | PRN
  Administered 2018-10-09 (×4): 50 ug via INTRAVENOUS
  Administered 2018-10-09: 100 ug via INTRAVENOUS

## 2018-10-09 MED ORDER — MORPHINE SULFATE 2 MG/ML IV SOLN
INTRAVENOUS | Status: DC
  Administered 2018-10-09: 12:00:00 via INTRAVENOUS
  Administered 2018-10-09: 1 mg via INTRAVENOUS
  Administered 2018-10-10: 7 mg via INTRAVENOUS
  Administered 2018-10-10: 3 mg via INTRAVENOUS
  Administered 2018-10-10: 5 mg via INTRAVENOUS
  Administered 2018-10-11: 3 mg via INTRAVENOUS
  Filled 2018-10-09: qty 30
  Filled 2018-10-09: qty 50

## 2018-10-09 MED ORDER — MIDAZOLAM HCL 2 MG/2ML IJ SOLN
INTRAMUSCULAR | Status: DC | PRN
  Administered 2018-10-09 (×2): 1 mg via INTRAVENOUS

## 2018-10-09 MED ORDER — DEXAMETHASONE SODIUM PHOSPHATE 10 MG/ML IJ SOLN
INTRAMUSCULAR | Status: DC | PRN
  Administered 2018-10-09: 10 mg via INTRAVENOUS

## 2018-10-09 MED ORDER — SODIUM CHLORIDE 0.9% FLUSH: 9.0000 mL | INTRAVENOUS | Status: DC | PRN

## 2018-10-09 MED ORDER — BUPIVACAINE HCL (PF) 0.5 % IJ SOLN
INTRAMUSCULAR | Status: DC | PRN
  Administered 2018-10-09: 30 mL

## 2018-10-09 MED ORDER — BUPIVACAINE LIPOSOME 1.3 % IJ SUSP
INTRAMUSCULAR | Status: DC | PRN
  Administered 2018-10-09: 20 mL

## 2018-10-09 MED ORDER — DIPHENHYDRAMINE HCL 12.5 MG/5ML PO ELIX
12.5000 mg | ORAL_SOLUTION | Freq: Four times a day (QID) | ORAL | Status: DC | PRN
  Filled 2018-10-09: qty 5

## 2018-10-09 MED ORDER — MUPIROCIN 2 % EX OINT
1.0000 "application " | TOPICAL_OINTMENT | Freq: Two times a day (BID) | CUTANEOUS | Status: DC
  Administered 2018-10-09 – 2018-10-12 (×7): 1 via NASAL
  Filled 2018-10-09 (×2): qty 22

## 2018-10-09 MED ORDER — PROPOFOL 10 MG/ML IV BOLUS
INTRAVENOUS | Status: DC | PRN
  Administered 2018-10-09: 20 mg via INTRAVENOUS
  Administered 2018-10-09: 110 mg via INTRAVENOUS
  Administered 2018-10-09: 30 mg via INTRAVENOUS

## 2018-10-09 MED ORDER — ROCURONIUM BROMIDE 10 MG/ML (PF) SYRINGE
PREFILLED_SYRINGE | INTRAVENOUS | Status: AC
  Filled 2018-10-09: qty 30

## 2018-10-09 MED ORDER — DEXTROSE-NACL 5-0.9 % IV SOLN
INTRAVENOUS | Status: DC
  Administered 2018-10-09 – 2018-10-11 (×3): via INTRAVENOUS

## 2018-10-09 MED ORDER — LACTATED RINGERS IV SOLN
INTRAVENOUS | Status: DC
  Administered 2018-10-09: 07:00:00 via INTRAVENOUS

## 2018-10-09 MED ORDER — TRAMADOL HCL 50 MG PO TABS
50.0000 mg | ORAL_TABLET | Freq: Four times a day (QID) | ORAL | Status: DC | PRN
  Administered 2018-10-11 (×2): 100 mg via ORAL
  Filled 2018-10-09 (×2): qty 2

## 2018-10-09 MED ORDER — LEVALBUTEROL HCL 0.63 MG/3ML IN NEBU
0.6300 mg | INHALATION_SOLUTION | Freq: Three times a day (TID) | RESPIRATORY_TRACT | Status: DC
  Administered 2018-10-10 (×3): 0.63 mg via RESPIRATORY_TRACT
  Filled 2018-10-09 (×3): qty 3

## 2018-10-09 MED ORDER — FENTANYL CITRATE (PF) 100 MCG/2ML IJ SOLN: 25.0000 ug | INTRAMUSCULAR | Status: DC | PRN

## 2018-10-09 MED ORDER — MIDAZOLAM HCL 2 MG/2ML IJ SOLN
INTRAMUSCULAR | Status: AC
  Filled 2018-10-09: qty 2

## 2018-10-09 MED ORDER — CALCIUM CARBONATE 1250 (500 CA) MG PO TABS
1.0000 | ORAL_TABLET | Freq: Every evening | ORAL | Status: DC
  Administered 2018-10-09 – 2018-10-12 (×4): 500 mg via ORAL
  Filled 2018-10-09 (×4): qty 1

## 2018-10-09 MED ORDER — LIDOCAINE 2% (20 MG/ML) 5 ML SYRINGE
INTRAMUSCULAR | Status: DC | PRN
  Administered 2018-10-09: 100 mg via INTRAVENOUS

## 2018-10-09 MED ORDER — ENOXAPARIN SODIUM 40 MG/0.4ML ~~LOC~~ SOLN
40.0000 mg | SUBCUTANEOUS | Status: DC
  Administered 2018-10-10 – 2018-10-12 (×3): 40 mg via SUBCUTANEOUS
  Filled 2018-10-09 (×3): qty 0.4

## 2018-10-09 MED ORDER — ONDANSETRON HCL 4 MG/2ML IJ SOLN
4.0000 mg | Freq: Four times a day (QID) | INTRAMUSCULAR | Status: DC | PRN
  Filled 2018-10-09: qty 2

## 2018-10-09 MED ORDER — POTASSIUM CHLORIDE 10 MEQ/50ML IV SOLN: 10.0000 meq | Freq: Every day | INTRAVENOUS | Status: DC | PRN

## 2018-10-09 MED ORDER — PHENYLEPHRINE HCL (PRESSORS) 10 MG/ML IV SOLN
INTRAVENOUS | Status: DC | PRN
  Administered 2018-10-09: 40 ug via INTRAVENOUS

## 2018-10-09 MED ORDER — OXYCODONE HCL 5 MG PO TABS: 5.0000 mg | ORAL_TABLET | Freq: Once | ORAL | Status: DC | PRN

## 2018-10-09 MED ORDER — NALOXONE HCL 0.4 MG/ML IJ SOLN: 0.4000 mg | INTRAMUSCULAR | Status: DC | PRN

## 2018-10-09 MED ORDER — SENNOSIDES-DOCUSATE SODIUM 8.6-50 MG PO TABS
1.0000 | ORAL_TABLET | Freq: Every day | ORAL | Status: DC
  Administered 2018-10-09 – 2018-10-12 (×4): 1 via ORAL
  Filled 2018-10-09 (×4): qty 1

## 2018-10-09 MED ORDER — SODIUM CHLORIDE 0.9 % IV SOLN
INTRAVENOUS | Status: DC | PRN
  Administered 2018-10-09: 09:00:00 30 ug/min via INTRAVENOUS

## 2018-10-09 MED ORDER — CEFAZOLIN SODIUM-DEXTROSE 2-4 GM/100ML-% IV SOLN
2.0000 g | Freq: Three times a day (TID) | INTRAVENOUS | Status: AC
  Administered 2018-10-09 – 2018-10-10 (×2): 2 g via INTRAVENOUS
  Filled 2018-10-09 (×2): qty 100

## 2018-10-09 MED ORDER — BUPIVACAINE HCL (PF) 0.5 % IJ SOLN
INTRAMUSCULAR | Status: AC
  Filled 2018-10-09: qty 30

## 2018-10-09 SURGICAL SUPPLY — 50 items
CANISTER SUCT 3000ML PPV (MISCELLANEOUS) ×6 IMPLANT
CATH THORACIC 28FR (CATHETERS) ×3 IMPLANT
CATH THORACIC 36FR (CATHETERS) IMPLANT
CLIP VESOCCLUDE MED 6/CT (CLIP) ×3 IMPLANT
CONT SPEC 4OZ CLIKSEAL STRL BL (MISCELLANEOUS) ×24 IMPLANT
COVER SURGICAL LIGHT HANDLE (MISCELLANEOUS) ×3 IMPLANT
COVER WAND RF STERILE (DRAPES) ×3 IMPLANT
DERMABOND ADVANCED (GAUZE/BANDAGES/DRESSINGS) ×2
DERMABOND ADVANCED .7 DNX12 (GAUZE/BANDAGES/DRESSINGS) ×1 IMPLANT
DRAPE CV SPLIT W-CLR ANES SCRN (DRAPES) ×3 IMPLANT
DRAPE ORTHO SPLIT 77X108 STRL (DRAPES) ×2
DRAPE SURG ORHT 6 SPLT 77X108 (DRAPES) ×1 IMPLANT
DRAPE WARM FLUID 44X44 (DRAPE) ×3 IMPLANT
ELECT BLADE 6.5 EXT (BLADE) ×3 IMPLANT
ELECT REM PT RETURN 9FT ADLT (ELECTROSURGICAL) ×3
ELECTRODE REM PT RTRN 9FT ADLT (ELECTROSURGICAL) ×1 IMPLANT
GAUZE SPONGE 4X4 12PLY STRL LF (GAUZE/BANDAGES/DRESSINGS) ×3 IMPLANT
GLOVE SURG SIGNA 7.5 PF LTX (GLOVE) ×6 IMPLANT
GOWN STRL REUS W/ TWL LRG LVL3 (GOWN DISPOSABLE) ×2 IMPLANT
GOWN STRL REUS W/ TWL XL LVL3 (GOWN DISPOSABLE) ×2 IMPLANT
GOWN STRL REUS W/TWL LRG LVL3 (GOWN DISPOSABLE) ×4
GOWN STRL REUS W/TWL XL LVL3 (GOWN DISPOSABLE) ×4
KIT BASIN OR (CUSTOM PROCEDURE TRAY) ×3 IMPLANT
KIT SUCTION CATH 14FR (SUCTIONS) ×3 IMPLANT
KIT TURNOVER KIT B (KITS) ×3 IMPLANT
NS IRRIG 1000ML POUR BTL (IV SOLUTION) ×9 IMPLANT
PACK CHEST (CUSTOM PROCEDURE TRAY) ×3 IMPLANT
PAD ARMBOARD 7.5X6 YLW CONV (MISCELLANEOUS) ×6 IMPLANT
SOLUTION ANTI FOG 6CC (MISCELLANEOUS) ×3 IMPLANT
SPECIMEN JAR MEDIUM (MISCELLANEOUS) ×3 IMPLANT
SPONGE INTESTINAL PEANUT (DISPOSABLE) ×3 IMPLANT
SPONGE TONSIL TAPE 1 RFD (DISPOSABLE) ×3 IMPLANT
STAPLE RELOAD 45MM GOLD (STAPLE) ×45 IMPLANT
STAPLER ECHELON POWERED (MISCELLANEOUS) ×3 IMPLANT
SUT SILK  1 MH (SUTURE) ×6
SUT SILK 1 MH (SUTURE) ×3 IMPLANT
SUT SILK 1 TIES 10X30 (SUTURE) ×3 IMPLANT
SUT SILK 2 0 SH CR/8 (SUTURE) ×3 IMPLANT
SUT SILK 3 0SH CR/8 30 (SUTURE) ×3 IMPLANT
SUT VIC AB 1 CTX 27 (SUTURE) ×3 IMPLANT
SUT VIC AB 2-0 CTX 36 (SUTURE) ×6 IMPLANT
SUT VIC AB 3-0 X1 27 (SUTURE) ×3 IMPLANT
SYR 30ML LL (SYRINGE) ×3 IMPLANT
SYSTEM SAHARA CHEST DRAIN ATS (WOUND CARE) ×3 IMPLANT
TAPE CLOTH SURG 4X10 WHT LF (GAUZE/BANDAGES/DRESSINGS) ×3 IMPLANT
TOWEL GREEN STERILE (TOWEL DISPOSABLE) ×3 IMPLANT
TOWEL GREEN STERILE FF (TOWEL DISPOSABLE) ×3 IMPLANT
TRAY FOLEY MTR SLVR 16FR STAT (SET/KITS/TRAYS/PACK) ×3 IMPLANT
TROCAR XCEL BLADELESS 5X75MML (TROCAR) ×3 IMPLANT
WATER STERILE IRR 1000ML POUR (IV SOLUTION) ×6 IMPLANT

## 2018-10-09 NOTE — Op Note (Signed)
NAME: Stephanie Crawford, Stephanie A. MEDICAL RECORD ZW:[id number] ACCOUNT [id number] DATE OF BIRTH:[birth year] FACILITY: [facility] LOCATION: [facility] PHYSICIAN:Rim Thatch Chaya Jan, MD  OPERATIVE REPORT  DATE OF PROCEDURE:  10/09/2018  PREOPERATIVE DIAGNOSIS:  Right lower lobe lung nodules.  POSTOPERATIVE DIAGNOSIS:  Adenocarcinoma, right lower lobe, clinical stage IIB (T3, N0).  PROCEDURE:   Right video-assisted thoracoscopy, Wedge resection x3 from right lower lobe, Lymph node sampling and Intercostal nerve block from levels 3-9.  SURGEON:  Modesto Charon, MD  ASSISTANT:  Ellwood Handler, PA-C  ANESTHESIA:  General.  FINDINGS:  Nodule in inferior right lower lobe consistent with adenocarcinoma with clear margin.  Posterolateral nodule, no tumor seen on frozen.  Small punctate lesion on superior segment, frozen revealed adenocarcinoma with close margin.  Additional margin  removed.  CLINICAL NOTE:  Stephanie Crawford is a 73 year old lifelong nonsmoker who was incidentally found to have a lung nodule in 2018.  She has been followed with scans since then.  Recently, there was an increase in size of a basilar right lower lobe lung nodule.  A  second subsolid nodule was stable.  There were numerous tiny nodules that were unchanged.  On PET CT, there was mild uptake in the subsolid nodules concerning for low-grade adenocarcinomas.  She was advised to undergo surgical resection for definitive  diagnosis and treatment.  The indications, risks, benefits, and alternatives were discussed in detail with the patient.  She understood and accepted the risks and agreed to proceed.  OPERATIVE NOTE:  The patient was brought to the preoperative holding area on 10/09/2018.  Anesthesia placed a central venous catheter and an arterial blood pressure monitoring line.  She was taken to the operating room, anesthetized and intubated with a  double-lumen endotracheal tube.  Intravenous antibiotics were administered.  A  Foley catheter was placed.  Sequential compression devices were placed on the calves for DVT prophylaxis.  She was placed in a left lateral decubitus position, and the right  chest was prepped and draped in the usual sterile fashion.  Single-lung ventilation of the left lung was initiated and was tolerated well throughout the procedure.  A timeout was performed.  A solution containing 20 mL of liposomal bupivacaine, 30 mL of 0.5% bupivacaine and 50 mL of saline was prepared.  This was used for local anesthesia at the incision sites as well as for the intercostal nerve blocks.  The  incision sites were injected prior to making the incisions.  An incision was made in the 7th interspace in the midaxillary line.  A 5 mm port was inserted.  The thoracoscope was advanced into the chest.  There was no abnormality of the visceral or parietal pleura.  There was good isolation of the right lung,  although it was not completely deflated initially.  Inspection revealed the fissure was nearly complete.  The inferior ligament was divided with electrocautery.  All lymph nodes that were encountered during the procedure were removed and sent as separate  specimens for permanent pathology.  The pleural reflection was divided at the hilum posteriorly, and the level 7 node was removed and sent.  While the lung was retracted anteriorly, there was a small punctate lesion about 3 mm in size on the surface of  the lung and the superior segment of the right lower lobe.  A wedge resection was performed incorporating this small punctate lesion.  An Echelon stapler with gold cartridges was used for all of the wedge resections.  The specimen was sent for frozen  section of  the nodule in the margin.  Next, a wedge resection was performed incorporating the lateral and posterior portion of the inferior edge of the right lower lobe, the site where the most suspicious nodule was present.  This was also sent for  frozen section of both the  nodule and the margin.  The frozen section on the first nodule returned showing an adenocarcinoma.  It was about a millimeter from the margin, and an additional margin of tissue was obtained by removing that staple line, again using the Echelon stapler.  This was sent for  permanent pathology only.  The ground glass or subsolid nodule in the lateral aspect of the right lower lobe was palpated.  It was resected.  Again, a wedge resection was performed with sequential firings of the Echelon stapler.  This also was sent for  frozen section but returned with no malignancy seen.  The lymph nodes in the fissure were removed and sent separately for permanent pathology.  The chest was copiously irrigated with warm saline.  A test inflation revealed no leakage at the staple lines.   A 28-French chest tube was placed through the original port incision and secured with a #1 silk suture.  It should be noted that intercostal nerve blocks were performed from levels 3-9 while awaiting the results of the final frozen section.  This was  done by inserting a needle from a posterior approach and injecting 10 mL of the bupivacaine solution into a subpleural plane at each level.  Dual-lung ventilation was resumed.  The chest tube was placed to suction.  The working incision was closed in 3  layers.  Dermabond was applied.  The patient was placed back in supine position.  All sponge, needle and instrument counts were correct at the end of the procedure.  The patient was extubated in the operating room and taken to the postanesthetic care unit in good condition.  LN/NUANCE  D:10/09/2018 T:10/09/2018 JOB:[id number]

## 2018-10-09 NOTE — Interval H&P Note (Signed)
History and Physical Interval Note:  10/09/2018 8:05 AM  Stephanie Crawford  has presented today for surgery, with the diagnosis of RLL LUNG NODULES.  The various methods of treatment have been discussed with the patient and family. After consideration of risks, benefits and other options for treatment, the patient has consented to  Procedure(s): VIDEO ASSISTED THORACOSCOPY (VATS)/WEDGE RESECTION (Right) as a surgical intervention.  The patient's history has been reviewed, patient examined, no change in status, stable for surgery.  I have reviewed the patient's chart and labs.  Questions were answered to the patient's satisfaction.     Melrose Nakayama

## 2018-10-09 NOTE — Anesthesia Procedure Notes (Signed)
Procedure Name: Intubation Date/Time: 10/09/2018 8:45 AM Performed by: Elayne Snare, CRNA Pre-anesthesia Checklist: Patient identified, Emergency Drugs available, Suction available and Patient being monitored Patient Re-evaluated:Patient Re-evaluated prior to induction Oxygen Delivery Method: Circle System Utilized Preoxygenation: Pre-oxygenation with 100% oxygen Induction Type: IV induction and Rapid sequence Laryngoscope Size: Mac and 4 Grade View: Grade I Tube type: Oral Endobronchial tube: Left, Double lumen EBT, EBT position confirmed by auscultation and EBT position confirmed by fiberoptic bronchoscope and 37 Fr Number of attempts: 1 Airway Equipment and Method: Stylet and Oral airway Placement Confirmation: ETT inserted through vocal cords under direct vision,  positive ETCO2 and breath sounds checked- equal and bilateral Tube secured with: Tape Dental Injury: Teeth and Oropharynx as per pre-operative assessment

## 2018-10-09 NOTE — Transfer of Care (Signed)
Immediate Anesthesia Transfer of Care Note  Patient: Stephanie Crawford  Procedure(s) Performed: VIDEO ASSISTED THORACOSCOPY (VATS)/WEDGE RESECTION (Right Chest)  Patient Location: PACU  Anesthesia Type:General  Level of Consciousness: drowsy and patient cooperative  Airway & Oxygen Therapy: Patient Spontanous Breathing  Post-op Assessment: Report given to RN and Post -op Vital signs reviewed and stable  Post vital signs: Reviewed and stable  Last Vitals:  Vitals Value Taken Time  BP 129/78 10/09/2018 11:16 AM  Temp    Pulse 82 10/09/2018 11:18 AM  Resp 14 10/09/2018 11:18 AM  SpO2 99 % 10/09/2018 11:18 AM  Vitals shown include unvalidated device data.  Last Pain:  Vitals:   10/09/18 0702  TempSrc:   PainSc: 0-No pain         Complications: No apparent anesthesia complications

## 2018-10-09 NOTE — Plan of Care (Signed)
Patient continues to use PCA pump effectively.

## 2018-10-09 NOTE — Anesthesia Procedure Notes (Signed)
Arterial Line Insertion Start/End5/20/2020 7:45 AM Performed by: Elayne Snare, CRNA, CRNA  Preanesthetic checklist: patient identified, IV checked, risks and benefits discussed, surgical consent and monitors and equipment checked Lidocaine 1% used for infiltration Left, radial was placed Catheter size: 20 G Hand hygiene performed  and maximum sterile barriers used  Allen's test indicative of satisfactory collateral circulation Attempts: 1 Procedure performed without using ultrasound guided technique. Following insertion, dressing applied and Biopatch. Post procedure assessment: normal  Patient tolerated the procedure well with no immediate complications.

## 2018-10-09 NOTE — Brief Op Note (Addendum)
10/09/2018  10:57 AM  PATIENT:  Stephanie Crawford  73 y.o. female  PRE-OPERATIVE DIAGNOSIS:  RLL LUNG NODULES  POST-OPERATIVE DIAGNOSIS:  ADENOCARCINOMA RIGHT LOWER LOBE - CLINICAL STAGE IIB (T3N0)  PROCEDURE:  Procedure(s):  VIDEO ASSISTED THORACOSCOPY  -Wedge Resection Superior Right Lower Lobe -Wedge Resection Inferior portion Right Lower Lobe -Wedge Resection Posterior portion Right Lower Lobe Lymph Node Dissection INTERCOSTAL NERVE BLOCKS - levels 3 to 9   SURGEON:  Surgeon(s) and Role:    * Melrose Nakayama, MD - Primary  PHYSICIAN ASSISTANT: Ellwood Handler PA-C  ANESTHESIA:   general  EBL:  30 mL   BLOOD ADMINISTERED:none  DRAINS: 28 Straight Chest Tube Right Chest   LOCAL MEDICATIONS USED:  BUPIVICAINE   SPECIMEN:  Source of Specimen:  Wedge Resection RLL x 3, Lymph Nodes  DISPOSITION OF SPECIMEN:  PATHOLOGY  COUNTS:  YES  TOURNIQUET:  * No tourniquets in log *  DICTATION: .Dragon Dictation  PLAN OF CARE: Admit to inpatient   PATIENT DISPOSITION:  PACU - hemodynamically stable.   Delay start of Pharmacological VTE agent (>24hrs) due to surgical blood loss or risk of bleeding: no

## 2018-10-09 NOTE — Progress Notes (Signed)
Patient ID: Stephanie Crawford, female   DOB: 1945-08-11, 73 y.o.   MRN: [id number]  TCTS Evening Rounds:   Hemodynamically stable  sats 98% on 2L.  Pain under control  Urine output good  CT output low, no air leak.  CBC    Component Value Date/Time   WBC 5.8 10/07/2018 1350   RBC 4.41 10/07/2018 1350   HGB 13.3 10/07/2018 1350   HCT 40.0 10/07/2018 1350   PLT 289 10/07/2018 1350   MCV 90.7 10/07/2018 1350   MCH 30.2 10/07/2018 1350   MCHC 33.3 10/07/2018 1350   RDW 12.1 10/07/2018 1350   LYMPHSABS 2.2 08/26/2014 1203   MONOABS 0.4 08/26/2014 1203   EOSABS 0.3 08/26/2014 1203   BASOSABS 0.1 08/26/2014 1203     BMET    Component Value Date/Time   NA 138 10/07/2018 1350   K 3.7 10/07/2018 1350   CL 104 10/07/2018 1350   CO2 22 10/07/2018 1350   GLUCOSE 96 10/07/2018 1350   BUN 8 10/07/2018 1350   CREATININE 0.80 10/07/2018 1350   CREATININE 0.75 07/14/2014 1231   CALCIUM 9.4 10/07/2018 1350   CALCIUM 10.1 12/28/2009 2231   GFRNONAA >60 10/07/2018 1350   GFRNONAA >60 03/01/2011 1054   GFRAA >60 10/07/2018 1350   GFRAA >60 03/01/2011 1054     A/P:  Stable postop course. Continue current plans

## 2018-10-09 NOTE — Progress Notes (Signed)
Admission (1245): Patient arrived via stretcher accompanied by PACU RN. Alert and oriented x 4. Afebrile. Chest tube to right lateral chest wall connected to suction with serous drainage. PCA pump made available to patient (low dose Morphine). On D5 0.9% running at 100 ml/hr. Foley remains intact draining clear yellow/straw urine. Call bell within reach. Encouraged to report signs and symptoms to staff.

## 2018-10-09 NOTE — Anesthesia Postprocedure Evaluation (Signed)
Anesthesia Post Note  Patient: Stephanie Crawford  Procedure(s) Performed: VIDEO ASSISTED THORACOSCOPY (VATS)/WEDGE RESECTION (Right Chest)     Patient location during evaluation: PACU Anesthesia Type: General Level of consciousness: awake and alert Pain management: pain level controlled Vital Signs Assessment: post-procedure vital signs reviewed and stable Respiratory status: spontaneous breathing, nonlabored ventilation, respiratory function stable and patient connected to nasal cannula oxygen Cardiovascular status: blood pressure returned to baseline and stable Postop Assessment: no apparent nausea or vomiting Anesthetic complications: no    Last Vitals:  Vitals:   10/09/18 1520 10/09/18 1622  BP:    Pulse:    Resp:  17  Temp: 36.6 C   SpO2:  99%    Last Pain:  Vitals:   10/09/18 1622  TempSrc:   PainSc: 3                  Brenda Cowher

## 2018-10-10 ENCOUNTER — Inpatient Hospital Stay (HOSPITAL_COMMUNITY): Payer: BC Managed Care – PPO

## 2018-10-10 ENCOUNTER — Encounter (HOSPITAL_COMMUNITY): Payer: Self-pay | Admitting: Thoracic Surgery (Cardiothoracic Vascular Surgery)

## 2018-10-10 LAB — CBC
HCT: 36.7 % (ref 36.0–46.0)
Hemoglobin: 12.5 g/dL (ref 12.0–15.0)
MCH: 30.6 pg (ref 26.0–34.0)
MCHC: 34.1 g/dL (ref 30.0–36.0)
MCV: 89.7 fL (ref 80.0–100.0)
Platelets: 272 10*3/uL (ref 150–400)
RBC: 4.09 MIL/uL (ref 3.87–5.11)
RDW: 12 % (ref 11.5–15.5)
WBC: 11.3 10*3/uL — ABNORMAL HIGH (ref 4.0–10.5)
nRBC: 0 % (ref 0.0–0.2)

## 2018-10-10 LAB — BASIC METABOLIC PANEL
Anion gap: 9 (ref 5–15)
BUN: 6 mg/dL — ABNORMAL LOW (ref 8–23)
CO2: 24 mmol/L (ref 22–32)
Calcium: 9.2 mg/dL (ref 8.9–10.3)
Chloride: 102 mmol/L (ref 98–111)
Creatinine, Ser: 0.8 mg/dL (ref 0.44–1.00)
GFR calc Af Amer: >60 mL/min (ref >60)
GFR calc non Af Amer: >60 mL/min (ref >60)
Glucose, Bld: 155 mg/dL — ABNORMAL HIGH (ref 70–99)
Potassium: 3.9 mmol/L (ref 3.5–5.1)
Sodium: 135 mmol/L (ref 135–145)

## 2018-10-10 LAB — BLOOD GAS, ARTERIAL
Acid-Base Excess: 1.7 mmol/L (ref 0.0–2.0)
Bicarbonate: 26.3 mmol/L (ref 20.0–28.0)
O2 Saturation: 98.4 %
Patient temperature: 98.6
pCO2 arterial: 46 mmHg (ref 32.0–48.0)
pH, Arterial: 7.376 (ref 7.350–7.450)
pO2, Arterial: 118 mmHg — ABNORMAL HIGH (ref 83.0–108.0)

## 2018-10-10 MED ORDER — LEVALBUTEROL HCL 0.63 MG/3ML IN NEBU: 0.6300 mg | INHALATION_SOLUTION | Freq: Four times a day (QID) | RESPIRATORY_TRACT | Status: DC | PRN

## 2018-10-10 NOTE — Discharge Summary (Addendum)
Physician Discharge Summary  Patient ID: Stephanie Crawford MRN: [id number] DOB/AGE: 1945-09-02 73 y.o.  Admit date: 10/09/2018 Discharge date: 10/12/2018  Admission Diagnoses: Multiple lung nodules  Patient Active Problem List   Diagnosis Date Noted  . Primary osteoarthritis of both knees 05/17/2017  . Primary osteoarthritis of both hands 05/17/2017  . DDD (degenerative disc disease), lumbar 05/17/2017  . History of gastroesophageal reflux (GERD) 05/17/2017  . History of shingles 05/17/2017  . History of cellulitis 05/17/2017  . Multiple lung nodules on CT 11/30/2016  . Cough 11/30/2016  . Right knee pain 04/28/2015  . Foot ulcer ([place]) 07/15/2014  . Osteoporosis 01/30/2013  . Vitamin D deficiency 01/13/2013  . Other and unspecified hyperlipidemia 01/06/2013  . Breast thickening 01/06/2013  . Skin lesion of right leg 09/19/2012  . Chronic seasonal allergic rhinitis 09/19/2012  . Elevated bilirubin 07/12/2012  . History of cardiac murmur 07/12/2012  . Rash and nonspecific skin eruption 07/11/2012  . Rectal bleeding 05/11/2011  . Abnormal EKG 03/01/2011  . General medical examination 03/01/2011  . GERD 12/08/2009  . ANKLE EDEMA 12/08/2009   Discharge Diagnoses: Adenocarcinoma of the lung- synchronous T3, N0 stage IIB  Patient Active Problem List   Diagnosis Date Noted  . S/P partial lobectomy of lung 10/09/2018  . Primary osteoarthritis of both knees 05/17/2017  . Primary osteoarthritis of both hands 05/17/2017  . DDD (degenerative disc disease), lumbar 05/17/2017  . History of gastroesophageal reflux (GERD) 05/17/2017  . History of shingles 05/17/2017  . History of cellulitis 05/17/2017  . Multiple lung nodules on CT 11/30/2016  . Cough 11/30/2016  . Right knee pain 04/28/2015  . Foot ulcer ([place]) 07/15/2014  . Osteoporosis 01/30/2013  . Vitamin D deficiency 01/13/2013  . Other and unspecified hyperlipidemia 01/06/2013  . Breast thickening 01/06/2013  . Skin  lesion of right leg 09/19/2012  . Chronic seasonal allergic rhinitis 09/19/2012  . Elevated bilirubin 07/12/2012  . History of cardiac murmur 07/12/2012  . Rash and nonspecific skin eruption 07/11/2012  . Rectal bleeding 05/11/2011  . Abnormal EKG 03/01/2011  . General medical examination 03/01/2011  . GERD 12/08/2009  . ANKLE EDEMA 12/08/2009   Discharged Condition: good  History of Present Illness:  Stephanie Crawford is a 73 yo AA female with known history of heart murmur, allergies, asthma, and reflux.  She has never smoked.  She was originally noted to have lung nodules back in 2018 during workup for kidney stones.  Since that time she has been followed with CT scans.  Her most recent scan in March of this year showed an increase in her basilar right lower lobe nodule measuring 1.8 x 1.5 to 2.0 x 1.7 cm.  A second sub-solid nodule was stable at 1.6 x 1.2.  PET CT scan was obtained which showed both nodules to be mildly hypermetabolic, which was concerning for low grade- adenocarcinomas.  She was referred to Dr. Roxan Hockey for surgical resection.  At that time she denied weight loss change in appetite.  She denied any recent respiratory infections.  She does have a non-productive dry cough which is worse in the morning.  She denied chest pain, pressure tightness.It was felt she should undergo surgical resection for definitive diagnosis.  The risks and benefits of the procedure were explained to the patient and she was agreeable to proceed.  Hospital Course:   Stephanie Crawford presented to [facility] on 10/09/2018.  She was taken to the operating room and underwent Right VATS with wedge resection  from superior segment, inferior, and posterior portions of the right lower lobe, lymph node dissection, and intercostal nerve block.  She tolerated the procedure without difficulty, was extubated, and taken to the PACU in stable condition.  The patient did well post operatively.  Her CXR was free from  pneumothorax.  Her chest tube was free of air leak and placed on water seal on 10/10/2018.  She was medically stable and transferred to the progressive care unit in stable condition.  Follow up CXR showed no pneumothorax.  Mild left sided atelectasis, pleural effusion.  Her chest tube was removed on 10/11/2018.  Follow up CXR showed no evidence of pneumothorax.  Her pain is well controlled.  She is tolerating a diet.  Her incision is healing without evidence of infection.  She is medically stable for discharge home today.  Significant Diagnostic Studies: nuclear medicine:   1. Low level hypermetabolism (max SUV 3.1) within the subsolid 2.1 cm basilar right lower lobe pulmonary nodule, which had mildly increased in size on recent chest CT. Slow growing primary bronchogenic adenocarcinoma not excluded. 2. Low level metabolism (max SUV 2.3) within the subsolid 1.6 cm peripheral right lower lobe pulmonary nodule, which was stable in size on recent chest CT. Continued chest CT surveillance suggested for this nodule. 3. No hypermetabolic thoracic adenopathy or distant metastatic disease. 4. Stable left adrenal adenoma. 5. Moderate diffuse colonic diverticulosis. 6.  Aortic Atherosclerosis (ICD10-I70.0).  Pathology:  1. Lung, wedge biopsy/resection, RLL superior segment - ADENOCARCINOMA, WELL DIFFERENTIATED, SPANNING 0.3 CM. - MENINGOTHELIOD NODULE - THE SURGICAL RESECTION MARGIN IS NEGATIVE FOR ADENOCARCINOMA. 2. Lung, wedge biopsy/resection, RLL inferior - ADENOCARCINOMA, WELL DIFFERENTIATED, SPANNING 1.9 CM - MENINGOTHELIOID NODULE(S). - CARCINOID TUMORLET. - THE SURGICAL RESECTION MARGINS ARE NEGATIVE FOR ADENOCARCINOMA. 3. Lung, wedge biopsy/resection, RLL lateral - ADENOCARCINOMA, WELL DIFFERENTIATED, SPANNING 0.3 CM. - MENINGOTHELIOID NODULE(S) - THE SURGICAL RESECTION MARGINS ARE NEGATIVE FOR ADENOCARCINOMA. - SEE ONCOLOGY TABLE BELOW. 4. Lymph node, biopsy, Level 9 node,right lower  lobe - THERE IS NO EVIDENCE OF CARCINOMA IN 1 OF 1 LYMPH NODE (0/1). 5. Lymph node, biopsy, level 12 node, right lower lobe - THERE IS NO EVIDENCE OF CARCINOMA IN 1 OF 1 LYMPH NODE (0/1). 6. Lymph node, biopsy, level 11 node, right lower lobe - THERE IS NO EVIDENCE OF CARCINOMA IN 1 OF 1 LYMPH NODE (0/1). - SKELETAL MUSCLE. 7. Lung, wedge biopsy/resection, RLL superior segment - BENIGN LUNG PARENCHYMA.  Treatments: surgery:   Right video-assisted thoracoscopy, wedge resection x3 from right lower lobe, lymph node sampling and intercostal nerve block from levels 3-9.  Discharge Exam: Blood pressure 135/79, pulse 91, temperature 99.7 F (37.6 C), temperature source Oral, resp. rate 20, height 5\' 10"  (1.778 m), weight 99.2 kg, last menstrual period 05/23/1995, SpO2 91 %.  General appearance: alert, cooperative and no distress Heart: regular rate and rhythm Lungs: mildly dim in bases Abdomen: benign Extremities: no edema Wound: incis healing well   Disposition: Discharge disposition: 01-Home or Self Care       Discharge Instructions    Discharge patient   Complete by:  As directed    Discharge disposition:  01-Home or Self Care   Discharge patient date:  10/12/2018     Allergies as of 10/12/2018      Reactions   Adhesive [tape] Rash      Medication List    TAKE these medications   aspirin 325 MG EC tablet Take 650 mg by mouth daily as needed for pain.  azelastine 0.05 % ophthalmic solution Commonly known as:  OPTIVAR Place 1 drop into both eyes daily as needed (irritation).   calcium carbonate 600 MG Tabs tablet Commonly known as:  OS-CAL Take 1,200 mg by mouth every evening.   diclofenac sodium 1 % Gel Commonly known as:  VOLTAREN APPLY 3 GRAMS TO 3 LARGE JOINTS UP TO 3 TIMES DAILY. What changed:  See the new instructions.   Omega-3 1000 MG Caps Take 1,000 mg by mouth at bedtime.   oxyCODONE 5 MG immediate release tablet Commonly known as:  Oxy  IR/ROXICODONE Take 1 tablet (5 mg total) by mouth every 6 (six) hours as needed for up to 7 days for severe pain.   Vitamin D 50 MCG (2000 UT) tablet Take 2,000 Units by mouth at bedtime.      Follow-up Information    Melrose Nakayama, MD Follow up on 10/22/2018.   Specialty:  Cardiothoracic Surgery Why:  Appointment is at 4:45, please get CXR at 4:15 at [facility] located on first floor of our office building Contact information: [place] [address] [phone]           Signed: John Giovanni 10/12/2018, 8:38 AM

## 2018-10-10 NOTE — Discharge Instructions (Signed)
Video-Assisted Thoracic Surgery, Care After °This sheet gives you information about how to care for yourself after your procedure. Your health care provider may also give you more specific instructions. If you have problems or questions, contact your health care provider. °What can I expect after the procedure? °After the procedure, it is common to have: °· Some pain and soreness in your chest. °· Pain when breathing in (inhaling) and coughing. °· Constipation. °· Fatigue. °· Difficulty sleeping. °Follow these instructions at home: °Preventing pneumonia °· Take deep breaths or do breathing exercises as instructed by your health care provider. Doing this helps prevent lung infection (pneumonia). °· Cough frequently. Coughing may cause discomfort, but it is important to clear mucus (phlegm) and expand your lungs. If it hurts to cough, hold a pillow against your chest or place the palms of both hands on top of the incision (use splinting) when you cough. This may help relieve discomfort. °· If you were given an incentive spirometer, use it as directed. An incentive spirometer is a tool that measures how well you are filling your lungs with each breath. °· Participate in pulmonary rehabilitation as directed by your health care provider. This is a program that combines education, exercise, and support from a team of specialists. The goal is to help you heal and get back to your normal activities as soon as possible. °Medicines °· Take over-the-counter or prescription medicines only as told by your health care provider. °· If you have pain, take pain-relieving medicine before your pain becomes severe. This is important because if your pain is under control, you will be able to breathe and cough more comfortably. °· If you were prescribed an antibiotic medicine, take it as told by your health care provider. Do not stop taking the antibiotic even if you start to feel better. °Activity °· Ask your health care provider what  activities are safe for you. °· Avoid activities that use your chest muscles for at least 3-4 weeks. °· Do not lift anything that is heavier than 10 lb (4.5 kg), or the limit that your health care provider tells you, until he or she says that it is safe. °Incision care °· Follow instructions from your health care provider about how to take care of your incision(s). Make sure you: °? Wash your hands with soap and water before you change your bandage (dressing). If soap and water are not available, use hand sanitizer. °? Change your dressing as told by your health care provider. °? Leave stitches (sutures), skin glue, or adhesive strips in place. These skin closures may need to stay in place for 2 weeks or longer. If adhesive strip edges start to loosen and curl up, you may trim the loose edges. Do not remove adhesive strips completely unless your health care provider tells you to do that. °· Keep your dressing dry until it has been removed. °· Check your incision area every day for signs of infection. Check for: °? Redness, swelling, or pain. °? Fluid or blood. °? Warmth. °? Pus or a bad smell. °Bathing °· Do not take baths, swim, or use a hot tub until your health care provider approves. You may take showers. °· After your dressing has been removed, use soap and water to gently wash your incision area. Do not use anything else to clean your incision(s) unless your health care provider tells you to do this. °Driving ° °· Do not drive until your health care provider approves. °· Do not drive or   use heavy machinery while taking prescription pain medicine. °Eating and drinking °· Eat a healthy, balanced diet as instructed by your health care provider. A healthy diet includes plenty of fresh fruits and vegetables, whole grains, and low-fat (lean) proteins. °· Limit foods that are high in fat and processed sugars, such as fried and sweet foods. °· Drink enough fluid to keep your urine clear or pale yellow. °General  instructions ° °· To prevent or treat constipation while you are taking prescription pain medicine, your health care provider may recommend that you: °? Take over-the-counter or prescription medicines. °? Eat foods that are high in fiber, such as beans, fresh fruits and vegetables, and whole grains. °· Do not use any products that contain nicotine or tobacco, such as cigarettes and e-cigarettes. If you need help quitting, ask your health care provider. °· Avoid secondhand smoke. °· Wear compression stockings as told by your health care provider. These stockings help to prevent blood clots and reduce swelling in your legs. °· If you have a chest tube, care for it as instructed by your health care provider. Do not travel by airplane during the 2 weeks after your chest tube is removed, or until your health care provider says that this is safe. °· Keep all follow-up visits as told by your health care provider. This is important. °Contact a health care provider if: °· You have redness, swelling, or pain around an incision. °· You have fluid or blood coming from an incision. °· Your incision area feels warm to the touch. °· You have pus or a bad smell coming from an incision. °· You have a fever or chills. °· You have nausea or vomiting. °· You have pain that does not get better with medicine. °Get help right away if: °· You have chest pain. °· Your heart is fluttering or beating rapidly. °· You develop a rash. °· You have shortness of breath or trouble breathing. °· You are confused. °· You have trouble speaking. °· You feel weak, light-headed, or dizzy. °· You faint. °Summary °· To help prevent lung infection (pneumonia), take deep breaths or do breathing exercises as instructed by your health care provider. °· Cough frequently to clear mucus (phlegm) and expand your lungs. If it hurts to cough, hold a pillow against your chest or place the palms of both hands on top of the incision (use splinting) when you cough. °· If  you have pain, take pain-relieving medicine before your pain becomes severe. This is important because if your pain is under control, you will be able to breathe and cough more comfortably. °· Ask your health care provider what activities are safe for you. °This information is not intended to replace advice given to you by your health care provider. Make sure you discuss any questions you have with your health care provider. °Document Released: 09/02/2012 Document Revised: 04/17/2016 Document Reviewed: 04/17/2016 °Elsevier Interactive Patient Education © 2019 [employer] ° °

## 2018-10-10 NOTE — Progress Notes (Signed)
      [address]       [place] [address]             [phone]      1 Day Post-Op Procedure(s) (LRB): VIDEO ASSISTED THORACOSCOPY (VATS)/WEDGE RESECTION (Right)   Subjective:  Patient with pain, looks uncomfortable.  States pain medication does provide relief.  Denies N/V  Objective: Vital signs in last 24 hours: Temp:  [97 F (36.1 C)-97.9 F (36.6 C)] 97.7 F (36.5 C) (05/21 0436) Pulse Rate:  [64-84] 68 (05/21 0648) Cardiac Rhythm: Normal sinus rhythm (05/21 0323) Resp:  [9-25] 22 (05/21 0648) BP: (99-145)/(58-95) 115/65 (05/21 0637) SpO2:  [94 %-100 %] 99 % (05/21 0648) Arterial Line BP: (170-182)/(77-84) 182/84 (05/20 1200) Weight:  [99.2 kg] 99.2 kg (05/20 1300)  Intake/Output from previous day: 05/20 0701 - 05/21 0700 In: 3556.1 [P.O.:702; I.V.:2354.1; IV Piggyback:300] Out: 2454 [Urine:2290; Blood:50; Chest Tube:114]  General appearance: alert, cooperative and no distress Heart: regular rate and rhythm Lungs: clear to auscultation bilaterally Abdomen: soft, non-tender; bowel sounds normal; no masses,  no organomegaly Extremities: extremities normal, atraumatic, no cyanosis or edema Wound: clean and dry  Lab Results: Recent Labs    10/07/18 1350 10/10/18 0241  WBC 5.8 11.3*  HGB 13.3 12.5  HCT 40.0 36.7  PLT 289 272   BMET:  Recent Labs    10/07/18 1350 10/10/18 0241  NA 138 135  K 3.7 3.9  CL 104 102  CO2 22 24  GLUCOSE 96 155*  BUN 8 6*  CREATININE 0.80 0.80  CALCIUM 9.4 9.2    PT/INR:  Recent Labs    10/07/18 1350  LABPROT 13.2  INR 1.0   ABG    Component Value Date/Time   PHART 7.376 10/10/2018 0235   HCO3 26.3 10/10/2018 0235   O2SAT 98.4 10/10/2018 0235   CBG (last 3)  No results for input(s): GLUCAP in the last 72 hours.  Assessment/Plan: S/P Procedure(s) (LRB): VIDEO ASSISTED THORACOSCOPY (VATS)/WEDGE RESECTION (Right)  1. CV- Sinus Bradycardia 2. Pulm- CXR with atelectasis, no pneumothorax..  CT w/o air leak will place on water seal, 50 cc output since surgery 3. D/C arterial line 4. IV Fluids to KVO 5. Continue Lovenox for DVT prophylaxis 6. Dispo- patient stable, CT to water seal, D/C arterial line, IV fluids to [facility] transfer to [place]   LOS: 1 day    Ellwood Handler 10/10/2018

## 2018-10-11 ENCOUNTER — Inpatient Hospital Stay (HOSPITAL_COMMUNITY): Payer: BC Managed Care – PPO

## 2018-10-11 LAB — COMPREHENSIVE METABOLIC PANEL
ALT: 13 U/L (ref 0–44)
AST: 19 U/L (ref 15–41)
Albumin: 2.9 g/dL — ABNORMAL LOW (ref 3.5–5.0)
Alkaline Phosphatase: 73 U/L (ref 38–126)
Anion gap: 10 (ref 5–15)
BUN: 12 mg/dL (ref 8–23)
CO2: 26 mmol/L (ref 22–32)
Calcium: 9.3 mg/dL (ref 8.9–10.3)
Chloride: 101 mmol/L (ref 98–111)
Creatinine, Ser: 0.91 mg/dL (ref 0.44–1.00)
GFR calc Af Amer: >60 mL/min (ref >60)
GFR calc non Af Amer: >60 mL/min (ref >60)
Glucose, Bld: 110 mg/dL — ABNORMAL HIGH (ref 70–99)
Potassium: 4 mmol/L (ref 3.5–5.1)
Sodium: 137 mmol/L (ref 135–145)
Total Bilirubin: 1.8 mg/dL — ABNORMAL HIGH (ref 0.3–1.2)
Total Protein: 5.8 g/dL — ABNORMAL LOW (ref 6.5–8.1)

## 2018-10-11 LAB — CBC
HCT: 35.6 % — ABNORMAL LOW (ref 36.0–46.0)
Hemoglobin: 12.1 g/dL (ref 12.0–15.0)
MCH: 30.4 pg (ref 26.0–34.0)
MCHC: 34 g/dL (ref 30.0–36.0)
MCV: 89.4 fL (ref 80.0–100.0)
Platelets: 224 10*3/uL (ref 150–400)
RBC: 3.98 MIL/uL (ref 3.87–5.11)
RDW: 12.5 % (ref 11.5–15.5)
WBC: 14.5 10*3/uL — ABNORMAL HIGH (ref 4.0–10.5)
nRBC: 0 % (ref 0.0–0.2)

## 2018-10-11 MED ORDER — DICLOFENAC SODIUM 1 % TD GEL
2.0000 g | Freq: Two times a day (BID) | TRANSDERMAL | Status: DC | PRN
  Filled 2018-10-11: qty 100

## 2018-10-11 MED ORDER — ASPIRIN EC 325 MG PO TBEC: 650.0000 mg | DELAYED_RELEASE_TABLET | Freq: Every day | ORAL | Status: DC | PRN

## 2018-10-11 NOTE — Progress Notes (Addendum)
      [address]       [place], [place] [address]             [phone]      2 Days Post-Op Procedure(s) (LRB): VIDEO ASSISTED THORACOSCOPY (VATS)/WEDGE RESECTION (Right)   Subjective: Up in chair.  States pain is stronger today, getting relief with medications.  Denies N/V,  No BM  Objective: Vital signs in last 24 hours: Temp:  [97.9 F (36.6 C)-99.2 F (37.3 C)] 98.4 F (36.9 C) (05/22 0256) Pulse Rate:  [60-87] 82 (05/22 0256) Cardiac Rhythm: Normal sinus rhythm (05/22 0700) Resp:  [13-30] 17 (05/22 0426) BP: (93-116)/(48-74) 104/58 (05/22 0256) SpO2:  [97 %-100 %] 99 % (05/22 0426) FiO2 (%):  [34 %-38 %] 38 % (05/21 1633)  Intake/Output from previous day: 05/21 0701 - 05/22 0700 In: 426.3 [P.O.:240; I.V.:186.3] Out: 1245 [Urine:1225; Chest Tube:20]  General appearance: alert, cooperative and no distress Heart: regular rate and rhythm Lungs: clear to auscultation bilaterally Abdomen: soft, non-tender; bowel sounds normal; no masses,  no organomegaly Extremities: extremities normal, atraumatic, no cyanosis or edema Wound: clean and dry  Lab Results: Recent Labs    10/10/18 0241 10/11/18 0245  WBC 11.3* 14.5*  HGB 12.5 12.1  HCT 36.7 35.6*  PLT 272 224   BMET:  Recent Labs    10/10/18 0241 10/11/18 0245  NA 135 137  K 3.9 4.0  CL 102 101  CO2 24 26  GLUCOSE 155* 110*  BUN 6* 12  CREATININE 0.80 0.91  CALCIUM 9.2 9.3    PT/INR: No results for input(s): LABPROT, INR in the last 72 hours. ABG    Component Value Date/Time   PHART 7.376 10/10/2018 0235   HCO3 26.3 10/10/2018 0235   O2SAT 98.4 10/10/2018 0235   CBG (last 3)  No results for input(s): GLUCAP in the last 72 hours.  Assessment/Plan: S/P Procedure(s) (LRB): VIDEO ASSISTED THORACOSCOPY (VATS)/WEDGE RESECTION (Right)  1. Chest tube- no air leak present, some mild tidaling, 20 cc output recorded yesterday, can likely d/c chest tube today 2. CV- remains hemodynamically  stable 3. D/C Foley 4. Continue Lovenox for DVT prophylaxis 6. Dispo- patient with more pain today, getting relief with medications, no air leak, mild tidaling in pleurovac, will likely d/c chest tube today, repeat CXR in AM   LOS: 2 days    Ellwood Handler 10/11/2018 Patient seen and examined, agree with above Dc chest tube AMBULATE  Othello Sgroi C. Roxan Hockey, MD [facility] [phone]

## 2018-10-12 ENCOUNTER — Inpatient Hospital Stay (HOSPITAL_COMMUNITY): Payer: BC Managed Care – PPO

## 2018-10-12 MED ORDER — OXYCODONE HCL 5 MG PO TABS: 5.0000 mg | ORAL_TABLET | Freq: Four times a day (QID) | ORAL | 0 refills | Status: AC | PRN

## 2018-10-12 NOTE — Plan of Care (Signed)
  Problem: Pain Management Goal: Pain control Description Patient will demonstrate personal actions to control pain.   Outcome: Progressing   Problem: Education: Goal: Required Educational Video(s) Outcome: Progressing   Problem: Clinical Measurements: Goal: Postoperative complications will be avoided or minimized Outcome: Progressing   Problem: Skin Integrity: Goal: Demonstration of wound healing without infection will improve Outcome: Progressing   Problem: Education: Goal: Knowledge of General Education information will improve Description Including pain rating scale, medication(s)/side effects and non-pharmacologic comfort measures Outcome: Progressing   Problem: Health Behavior/Discharge Planning: Goal: Ability to manage health-related needs will improve Outcome: Progressing   Problem: Clinical Measurements: Goal: Ability to maintain clinical measurements within normal limits will improve Outcome: Progressing Goal: Will remain free from infection Outcome: Progressing Goal: Diagnostic test results will improve Outcome: Progressing Goal: Respiratory complications will improve Outcome: Progressing Goal: Cardiovascular complication will be avoided Outcome: Progressing   Problem: Activity: Goal: Risk for activity intolerance will decrease Outcome: Progressing   Problem: Nutrition: Goal: Adequate nutrition will be maintained Outcome: Progressing   Problem: Coping: Goal: Level of anxiety will decrease Outcome: Progressing   Problem: Elimination: Goal: Will not experience complications related to bowel motility Outcome: Progressing Goal: Will not experience complications related to urinary retention Outcome: Progressing   Problem: Pain Managment: Goal: General experience of comfort will improve Outcome: Progressing   Problem: Safety: Goal: Ability to remain free from injury will improve Outcome: Progressing   Problem: Skin Integrity: Goal: Risk for  impaired skin integrity will decrease Outcome: Progressing

## 2018-10-12 NOTE — Progress Notes (Addendum)
[address]       [place] [address]             [phone]      3 Days Post-Op Procedure(s) (LRB): VIDEO ASSISTED THORACOSCOPY (VATS)/WEDGE RESECTION (Right) Subjective: Feels pretty well, minor SOB, minor pain  Objective: Vital signs in last 24 hours: Temp:  [98.2 F (36.8 C)-98.9 F (37.2 C)] 98.9 F (37.2 C) (05/23 0341) Pulse Rate:  [86-91] 91 (05/23 0341) Cardiac Rhythm: Normal sinus rhythm (05/23 0716) Resp:  [20-30] 20 (05/23 0341) BP: (131-147)/(65-81) 147/81 (05/23 0341) SpO2:  [90 %-100 %] 91 % (05/23 0341)  Hemodynamic parameters for last 24 hours:    Intake/Output from previous day: 05/22 0701 - 05/23 0700 In: -  Out: 950 [Urine:950] Intake/Output this shift: No intake/output data recorded.  General appearance: alert, cooperative and no distress Heart: regular rate and rhythm Lungs: mildly dim in bases Abdomen: benign Extremities: no edema Wound: incis healing well  Lab Results: Recent Labs    10/10/18 0241 10/11/18 0245  WBC 11.3* 14.5*  HGB 12.5 12.1  HCT 36.7 35.6*  PLT 272 224   BMET:  Recent Labs    10/10/18 0241 10/11/18 0245  NA 135 137  K 3.9 4.0  CL 102 101  CO2 24 26  GLUCOSE 155* 110*  BUN 6* 12  CREATININE 0.80 0.91  CALCIUM 9.2 9.3    PT/INR: No results for input(s): LABPROT, INR in the last 72 hours. ABG    Component Value Date/Time   PHART 7.376 10/10/2018 0235   HCO3 26.3 10/10/2018 0235   O2SAT 98.4 10/10/2018 0235   CBG (last 3)  No results for input(s): GLUCAP in the last 72 hours.  Meds Scheduled Meds: . acetaminophen  1,000 mg Oral Q6H   Or  . acetaminophen (TYLENOL) oral liquid 160 mg/5 mL  1,000 mg Oral Q6H  . bisacodyl  10 mg Oral Daily  . calcium carbonate  1 tablet Oral QPM  . Chlorhexidine Gluconate Cloth  6 each Topical Q0600  . enoxaparin (LOVENOX) injection  40 mg Subcutaneous Q24H  . mupirocin ointment  1 application Nasal BID  . senna-docusate  1 tablet Oral QHS    Continuous Infusions: . dextrose 5 % and 0.9% NaCl 10 mL/hr at 10/11/18 0502  . potassium chloride     PRN Meds:.aspirin, diclofenac sodium, ketotifen, levalbuterol, ondansetron (ZOFRAN) IV, oxyCODONE, potassium chloride, traMADol  Xrays Dg Chest 1v Repeat Same Day  Result Date: 10/11/2018 CLINICAL DATA:  Status post partial lobectomy EXAM: CHEST - 1 VIEW SAME DAY COMPARISON:  Earlier today FINDINGS: Chest tube removal with no visible pneumothorax. Stable atelectasis and low lung volumes. Stable cardiomegaly. IMPRESSION: 1. Chest tube removal with no pneumothorax. 2. Stable low volumes with atelectasis. Electronically Signed   By: Monte Fantasia M.D.   On: 10/11/2018 10:34   Dg Chest Port 1 View  Result Date: 10/11/2018 CLINICAL DATA:  Partial lobectomy.  Chest tube. EXAM: PORTABLE CHEST 1 VIEW COMPARISON:  Yesterday FINDINGS: Right-sided chest tube in place with tip at the apex. No visible pneumothorax. There is increased atelectasis and pleural fluid on the right where there is postoperative volume loss. Cardiomegaly. IMPRESSION: Increased atelectasis and pleural fluid on the right. No visible pneumothorax. Electronically Signed   By: Monte Fantasia M.D.   On: 10/11/2018 07:27    Assessment/Plan: S/P Procedure(s) (LRB): VIDEO ASSISTED THORACOSCOPY (VATS)/WEDGE RESECTION (Right)  1 doing well overall, hemodyn stable in sinus rhythm.  No fevers 2 O2 sats good on RA, + atx on CXR with some pleural effus on right 3 pain controlled 4 no new labs 5 appears stable for discharge  LOS: 3 days    [facility] 10/12/2018 Pager [phone]  I have seen and examined the patient and agree with the assessment and plan as outlined.  Tentatively for d/c home tomorrow  Rexene Alberts, MD 10/12/2018 10:17 AM

## 2018-10-13 NOTE — Plan of Care (Signed)
  Problem: Pain Management Goal: Pain control Description Patient will demonstrate personal actions to control pain.   Outcome: Progressing   Problem: Education: Goal: Required Educational Video(s) Outcome: Progressing   Problem: Clinical Measurements: Goal: Postoperative complications will be avoided or minimized Outcome: Progressing   Problem: Skin Integrity: Goal: Demonstration of wound healing without infection will improve Outcome: Progressing   Problem: Education: Goal: Knowledge of General Education information will improve Description Including pain rating scale, medication(s)/side effects and non-pharmacologic comfort measures Outcome: Progressing   Problem: Health Behavior/Discharge Planning: Goal: Ability to manage health-related needs will improve Outcome: Progressing   Problem: Clinical Measurements: Goal: Ability to maintain clinical measurements within normal limits will improve Outcome: Progressing Goal: Will remain free from infection Outcome: Progressing Goal: Diagnostic test results will improve Outcome: Progressing Goal: Respiratory complications will improve Outcome: Progressing Goal: Cardiovascular complication will be avoided Outcome: Progressing   Problem: Activity: Goal: Risk for activity intolerance will decrease Outcome: Progressing   Problem: Nutrition: Goal: Adequate nutrition will be maintained Outcome: Progressing   Problem: Coping: Goal: Level of anxiety will decrease Outcome: Progressing   Problem: Elimination: Goal: Will not experience complications related to bowel motility Outcome: Progressing Goal: Will not experience complications related to urinary retention Outcome: Progressing   Problem: Pain Managment: Goal: General experience of comfort will improve Outcome: Progressing   Problem: Safety: Goal: Ability to remain free from injury will improve Outcome: Progressing   Problem: Skin Integrity: Goal: Risk for  impaired skin integrity will decrease Outcome: Progressing

## 2018-10-13 NOTE — Progress Notes (Addendum)
[address]       [place] [address]             [phone]      4 Days Post-Op Procedure(s) (LRB): VIDEO ASSISTED THORACOSCOPY (VATS)/WEDGE RESECTION (Right) Subjective: She feels better this am  Objective: Vital signs in last 24 hours: Temp:  [98.3 F (36.8 C)-98.9 F (37.2 C)] 98.5 F (36.9 C) (05/24 0711) Pulse Rate:  [87-98] 87 (05/24 0424) Cardiac Rhythm: Normal sinus rhythm (05/23 2000) Resp:  [19-29] 21 (05/24 0424) BP: (135-151)/(76-87) 139/76 (05/24 0424) SpO2:  [92 %-94 %] 94 % (05/24 0424)  Hemodynamic parameters for last 24 hours:    Intake/Output from previous day: 05/23 0701 - 05/24 0700 In: 684 [P.O.:684] Out: 300 [Urine:300] Intake/Output this shift: No intake/output data recorded.  General appearance: alert, cooperative and no distress Heart: regular rate and rhythm Lungs: clear to auscultation bilaterally Abdomen: benign Extremities: no edema or calf tenderness Wound: incis healing well  Lab Results: Recent Labs    10/11/18 0245  WBC 14.5*  HGB 12.1  HCT 35.6*  PLT 224   BMET:  Recent Labs    10/11/18 0245  NA 137  K 4.0  CL 101  CO2 26  GLUCOSE 110*  BUN 12  CREATININE 0.91  CALCIUM 9.3    PT/INR: No results for input(s): LABPROT, INR in the last 72 hours. ABG    Component Value Date/Time   PHART 7.376 10/10/2018 0235   HCO3 26.3 10/10/2018 0235   O2SAT 98.4 10/10/2018 0235   CBG (last 3)  No results for input(s): GLUCAP in the last 72 hours.  Meds Scheduled Meds: . acetaminophen  1,000 mg Oral Q6H   Or  . acetaminophen (TYLENOL) oral liquid 160 mg/5 mL  1,000 mg Oral Q6H  . bisacodyl  10 mg Oral Daily  . calcium carbonate  1 tablet Oral QPM  . Chlorhexidine Gluconate Cloth  6 each Topical Q0600  . enoxaparin (LOVENOX) injection  40 mg Subcutaneous Q24H  . mupirocin ointment  1 application Nasal BID  . senna-docusate  1 tablet Oral QHS   Continuous Infusions: . dextrose 5 % and 0.9%  NaCl 10 mL/hr at 10/11/18 0502  . potassium chloride     PRN Meds:.aspirin, diclofenac sodium, ketotifen, levalbuterol, ondansetron (ZOFRAN) IV, oxyCODONE, potassium chloride, traMADol  Xrays Dg Chest 2 View  Result Date: 10/12/2018 CLINICAL DATA:  Status post right partial lobectomy. Recent chest tube removal EXAM: CHEST - 2 VIEW COMPARISON:  Oct 10, 2008 FINDINGS: No pneumothorax evident. There is volume loss on the right with elevation the right hemidiaphragm, at least in part due to recent partial lobectomy on the right. There is bibasilar atelectatic change with small right pleural effusion. No airspace consolidation evident. Heart is upper normal in size with pulmonary vascularity normal. No adenopathy. No bone lesions. IMPRESSION: No appreciable pneumothorax. Postoperative change on the right with volume loss. Bibasilar atelectasis small right pleural effusion. No edema or consolidation. Heart size within normal limits. Electronically Signed   By: Lowella Grip III M.D.   On: 10/12/2018 08:31   Dg Chest 1v Repeat Same Day  Result Date: 10/11/2018 CLINICAL DATA:  Status post partial lobectomy EXAM: CHEST - 1 VIEW SAME DAY COMPARISON:  Earlier today FINDINGS: Chest tube removal with no visible pneumothorax. Stable atelectasis and low lung volumes. Stable cardiomegaly. IMPRESSION: 1. Chest tube removal with no pneumothorax. 2. Stable low volumes with atelectasis. Electronically Signed   By:  Monte Fantasia M.D.   On: 10/11/2018 10:34    Assessment/Plan: S/P Procedure(s) (LRB): VIDEO ASSISTED THORACOSCOPY (VATS)/WEDGE RESECTION (Right)  1 conts to improve and is clinically very stable 2 discharge to home today   LOS: 4 days    [facility] 10/13/2018 Pager [phone]  I have seen and examined the patient and agree with the assessment and plan as outlined.  Rexene Alberts, MD 10/13/2018 10:24 AM

## 2018-10-21 ENCOUNTER — Other Ambulatory Visit: Payer: Self-pay | Admitting: Thoracic Surgery (Cardiothoracic Vascular Surgery)

## 2018-10-21 DIAGNOSIS — Z902 Acquired absence of lung [part of]: Secondary | ICD-10-CM

## 2018-10-22 ENCOUNTER — Ambulatory Visit
Admission: RE | Admit: 2018-10-22 | Discharge: 2018-10-22 | Disposition: A | Discharge status: Home / Self Care | Payer: Medicare Other | Source: Ambulatory Visit | Attending: Thoracic Surgery (Cardiothoracic Vascular Surgery) | Admitting: Thoracic Surgery (Cardiothoracic Vascular Surgery)

## 2018-10-22 ENCOUNTER — Other Ambulatory Visit: Payer: Self-pay | Admitting: *Deleted

## 2018-10-22 ENCOUNTER — Ambulatory Visit (INDEPENDENT_AMBULATORY_CARE_PROVIDER_SITE_OTHER): Payer: Self-pay | Admitting: Thoracic Surgery (Cardiothoracic Vascular Surgery)

## 2018-10-22 ENCOUNTER — Other Ambulatory Visit: Payer: Self-pay

## 2018-10-22 VITALS — BP 115/79 | HR 108 | Temp 98.5°F | Resp 20 | Ht 70.0 in | Wt 211.0 lb

## 2018-10-22 DIAGNOSIS — Z902 Acquired absence of lung [part of]: Secondary | ICD-10-CM

## 2018-10-22 DIAGNOSIS — J90 Pleural effusion, not elsewhere classified: Secondary | ICD-10-CM | POA: Diagnosis not present

## 2018-10-22 DIAGNOSIS — C3491 Malignant neoplasm of unspecified part of right bronchus or lung: Secondary | ICD-10-CM

## 2018-10-22 MED ORDER — OXYCODONE HCL 5 MG PO CAPS: 5.0000 mg | ORAL_CAPSULE | Freq: Four times a day (QID) | ORAL | 0 refills | Status: DC | PRN

## 2018-10-22 NOTE — Progress Notes (Signed)
The proposed treatment discussed in cancer conference 10/17/2018 is for discussion purpose only and is not a binding recommendation.  The patient was not physically examine nor present for their treatment options.  Therefore, final treatment plans cannot be decided.

## 2018-10-22 NOTE — Progress Notes (Signed)
[address]       [place] [address]             [phone]    HPI: Stephanie Crawford returns for a scheduled follow-up visit   Stephanie Crawford is a 73 year old woman who is a lifelong non-smoker.  She was found to have lung nodules in 2018 after presenting with a kidney stone.  She was followed with CT scans and she had an increase in size of a basilar nodule.  PET/CT showed mild uptake in the basilar nodule as well as a second sub-solid nodule.  There were other tiny nodules as well.  I did a right VATS and multiple wedge resections on 10/09/2018.  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  Her postoperative course was uncomplicated and she went home on day 4.  She says she ran out of oxycodone a few days ago.  She is been having more pain since then.  She had one episode last week where she had some pressure in her chest but that resolved and has not returned.  Her pain now is along the area the incision and chest tube site and anterior to that.  Past Medical History:  Diagnosis Date  . Allergic rhinitis   . Allergy   . Anemia    prior to hysterectomy--1997  . GERD (gastroesophageal reflux disease)   . Heart murmur   . Hemorrhoid 2011  . History of shingles 04/2014  . Nodule of lower lobe of right lung     Current Outpatient Medications  Medication Sig Dispense Refill  . aspirin 325 MG EC tablet Take 650 mg by mouth daily as needed for pain.    Marland Kitchen azelastine (OPTIVAR) 0.05 % ophthalmic solution Place 1 drop into both eyes daily as needed (irritation).    . calcium carbonate (OS-CAL) 600 MG TABS tablet Take 1,200 mg by mouth every evening.     . Cholecalciferol (VITAMIN D) 50 MCG (2000 UT) tablet Take 2,000 Units by mouth at bedtime.    . diclofenac sodium (VOLTAREN) 1 % GEL APPLY 3 GRAMS TO 3 LARGE JOINTS UP TO 3 TIMES DAILY. (Patient taking differently: Apply 3 g topically 2 (two) times daily as needed (pain). ) 300 g 3  . Omega-3 1000 MG CAPS  Take 1,000 mg by mouth at bedtime.    Marland Kitchen oxycodone (OXY-IR) 5 MG capsule Take 1 capsule (5 mg total) by mouth every 6 (six) hours as needed. 25 capsule 0   No current facility-administered medications for this visit.     Physical Exam BP 115/79   Pulse (!) 108   Temp 98.5 F (36.9 C) (Oral)   Resp 20   Ht 5\' 10"  (1.778 m)   Wt 211 lb (95.7 kg)   LMP 05/23/1995   SpO2 97% Comment: RA  BMI 30.93 kg/m ] 73 year old woman anxious but in no acute distress Alert and oriented x3 Cardiac mildly tachycardic, regular Lungs diminished breath sounds right base Incisions clean dry and intact  Diagnostic Tests: CHEST - 2 VIEW  COMPARISON:  Radiographs 10/12/2018 and 10/11/2018. Chest CT 07/31/2018.  FINDINGS: Stable volume loss in the right hemithorax status post right lower lobectomy. There is improved aeration of the right lung base. A small right-sided pleural effusion versus pleural thickening remains. There is no pneumothorax. The left lung is clear. The heart size and mediastinal contours are stable.  IMPRESSION: Improving pulmonary aeration with small residual right pleural effusion. No pneumothorax.  Electronically Signed   By: Richardean Sale M.D.   On: 10/22/2018 16:51 I personally reviewed the chest x-ray images and concur with the findings as noted above although I think the pleural effusion is at least moderate.  Impression: Stephanie Crawford is a 73 year old woman who is a lifelong non-smoker.  She was first found to have lung nodules a couple of years ago.  Over time 1 nodule had increased in size whereas others have been stable.  These were groundglass opacities with small solid components.  There are numerous other tiny lung nodules.  I did a right VATS for wedge resection of multiple nodules.  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  Nodal sampling showed no evidence of metastases.  Pathologic stage is mpT3N0, IIB.  She is having quite a bit of  discomfort.  I am going to give her a new prescription for oxycodone 5 mg tablets, 1 every 6 hours as needed for pain, 25 tablets, no refills.  She can supplement that with ibuprofen or acetaminophen if needed  Looking back at her initial postop chest x-ray versus recurrent chest x-ray I think there is actually a moderate pleural effusion there.  I think she would benefit from my thoracentesis.  I am going to schedule for an ultrasound-guided thoracentesis.  I am going to refer her to our multidisciplinary thoracic oncology clinic to see Dr. Earlie Server from oncology to discuss adjuvant therapy.  She will need molecular testing given that she has multiple tumors and is a smoker.  She asked about returning to work.  She is not ready to do that yet.  We will discuss that when she returns in 3 weeks.  Plan: Ultrasound-guided right thoracentesis Oxycodone 5 mg p.o. every 6 hours PRN, 25 tablets, no refills Referral to multidisciplinary thoracic oncology clinic for oncology consultation Return in 3 weeks with PA and lateral chest x-ray   Melrose Nakayama, MD [facility] [phone]

## 2018-10-23 ENCOUNTER — Other Ambulatory Visit: Payer: Self-pay | Admitting: *Deleted

## 2018-10-23 ENCOUNTER — Other Ambulatory Visit (HOSPITAL_COMMUNITY)
Admission: RE | Admit: 2018-10-23 | Discharge: 2018-10-23 | Disposition: A | Discharge status: Home / Self Care | Payer: BC Managed Care – PPO | Source: Ambulatory Visit | Attending: Thoracic Surgery (Cardiothoracic Vascular Surgery) | Admitting: Thoracic Surgery (Cardiothoracic Vascular Surgery)

## 2018-10-23 ENCOUNTER — Encounter (HOSPITAL_COMMUNITY): Payer: Self-pay | Admitting: Internal Medicine

## 2018-10-23 ENCOUNTER — Telehealth: Payer: Self-pay | Admitting: *Deleted

## 2018-10-23 DIAGNOSIS — Z1159 Encounter for screening for other viral diseases: Secondary | ICD-10-CM | POA: Insufficient documentation

## 2018-10-23 DIAGNOSIS — Z902 Acquired absence of lung [part of]: Secondary | ICD-10-CM

## 2018-10-23 DIAGNOSIS — J90 Pleural effusion, not elsewhere classified: Secondary | ICD-10-CM

## 2018-10-23 LAB — SARS CORONAVIRUS 2 BY RT PCR (HOSPITAL ORDER, PERFORMED IN ~~LOC~~ HOSPITAL LAB): SARS Coronavirus 2: NEGATIVE

## 2018-10-23 NOTE — Telephone Encounter (Signed)
Oncology Nurse Navigator Documentation  Oncology Nurse Navigator Flowsheets 10/23/2018  Navigator Location [facility]  Referral date to RadOnc/MedOnc 10/22/2018  Navigator Encounter Type Telephone/I received referral on Stephanie Crawford yesterday.  I called her and scheduled her to be seen with Dr. Julien Nordmann this Friday.  She verbalized understanding of appt time and place.   Telephone Outgoing Call  Barriers/Navigation Needs Education;Coordination of Care  Education Other  Interventions Coordination of Care;Education  Coordination of Care Appts  Education Method Verbal  Acuity Level 2  Time Spent with Patient 30

## 2018-10-23 NOTE — Progress Notes (Unsigned)
US

## 2018-10-24 ENCOUNTER — Other Ambulatory Visit: Payer: Self-pay

## 2018-10-24 ENCOUNTER — Ambulatory Visit (HOSPITAL_COMMUNITY)
Admission: RE | Admit: 2018-10-24 | Discharge: 2018-10-24 | Disposition: A | Discharge status: Home / Self Care | Payer: BC Managed Care – PPO | Source: Ambulatory Visit | Attending: Student | Admitting: Student

## 2018-10-24 ENCOUNTER — Ambulatory Visit (HOSPITAL_COMMUNITY)
Admission: RE | Admit: 2018-10-24 | Discharge: 2018-10-24 | Disposition: A | Discharge status: Home / Self Care | Payer: BC Managed Care – PPO | Source: Ambulatory Visit | Attending: Thoracic Surgery (Cardiothoracic Vascular Surgery) | Admitting: Thoracic Surgery (Cardiothoracic Vascular Surgery)

## 2018-10-24 ENCOUNTER — Encounter (HOSPITAL_COMMUNITY): Payer: Self-pay | Admitting: Student

## 2018-10-24 DIAGNOSIS — Z9889 Other specified postprocedural states: Secondary | ICD-10-CM

## 2018-10-24 DIAGNOSIS — J90 Pleural effusion, not elsewhere classified: Secondary | ICD-10-CM

## 2018-10-24 HISTORY — PX: IR THORACENTESIS ASP PLEURAL SPACE W/IMG GUIDE: IMG5380

## 2018-10-24 MED ORDER — LIDOCAINE HCL (PF) 1 % IJ SOLN
INTRAMUSCULAR | Status: DC | PRN
  Administered 2018-10-24: 10 mL

## 2018-10-24 MED ORDER — LIDOCAINE HCL 1 % IJ SOLN
INTRAMUSCULAR | Status: AC
  Filled 2018-10-24: qty 20

## 2018-10-24 NOTE — Procedures (Signed)
PROCEDURE SUMMARY:  Successful US guided therapeutic right thoracentesis. Yielded 750 mL of amber fluid. Pt tolerated procedure well. No immediate complications.  Specimen was not sent for labs. CXR ordered.  EBL < 5 mL  Docia Barrier PA-C 10/24/2018 9:28 AM

## 2018-10-25 ENCOUNTER — Encounter: Payer: Self-pay | Admitting: *Deleted

## 2018-10-25 ENCOUNTER — Encounter: Payer: Self-pay | Admitting: Internal Medicine

## 2018-10-25 ENCOUNTER — Encounter: Payer: Self-pay | Admitting: Medical Oncology

## 2018-10-25 ENCOUNTER — Inpatient Hospital Stay: Payer: BC Managed Care – PPO | Attending: Internal Medicine

## 2018-10-25 ENCOUNTER — Inpatient Hospital Stay (HOSPITAL_BASED_OUTPATIENT_CLINIC_OR_DEPARTMENT_OTHER): Payer: BC Managed Care – PPO | Admitting: Internal Medicine

## 2018-10-25 ENCOUNTER — Other Ambulatory Visit: Payer: Self-pay

## 2018-10-25 DIAGNOSIS — Z902 Acquired absence of lung [part of]: Secondary | ICD-10-CM

## 2018-10-25 DIAGNOSIS — J90 Pleural effusion, not elsewhere classified: Secondary | ICD-10-CM

## 2018-10-25 DIAGNOSIS — D649 Anemia, unspecified: Secondary | ICD-10-CM

## 2018-10-25 DIAGNOSIS — Z5111 Encounter for antineoplastic chemotherapy: Secondary | ICD-10-CM

## 2018-10-25 DIAGNOSIS — R0602 Shortness of breath: Secondary | ICD-10-CM

## 2018-10-25 DIAGNOSIS — C3491 Malignant neoplasm of unspecified part of right bronchus or lung: Secondary | ICD-10-CM

## 2018-10-25 DIAGNOSIS — C3431 Malignant neoplasm of lower lobe, right bronchus or lung: Secondary | ICD-10-CM | POA: Diagnosis not present

## 2018-10-25 DIAGNOSIS — C3490 Malignant neoplasm of unspecified part of unspecified bronchus or lung: Secondary | ICD-10-CM

## 2018-10-25 LAB — CMP (CANCER CENTER ONLY)
ALT: 13 U/L (ref 0–44)
AST: 12 U/L — ABNORMAL LOW (ref 15–41)
Albumin: 3.5 g/dL (ref 3.5–5.0)
Alkaline Phosphatase: 118 U/L (ref 38–126)
Anion gap: 11 (ref 5–15)
BUN: 10 mg/dL (ref 8–23)
CO2: 26 mmol/L (ref 22–32)
Calcium: 9.5 mg/dL (ref 8.9–10.3)
Chloride: 104 mmol/L (ref 98–111)
Creatinine: 0.84 mg/dL (ref 0.44–1.00)
GFR, Est AFR Am: >60 mL/min (ref >60)
GFR, Estimated: >60 mL/min (ref >60)
Glucose, Bld: 79 mg/dL (ref 70–99)
Potassium: 4.1 mmol/L (ref 3.5–5.1)
Sodium: 141 mmol/L (ref 135–145)
Total Bilirubin: 1.4 mg/dL — ABNORMAL HIGH (ref 0.3–1.2)
Total Protein: 7 g/dL (ref 6.5–8.1)

## 2018-10-25 LAB — CBC WITH DIFFERENTIAL (CANCER CENTER ONLY)
Abs Immature Granulocytes: 0.05 10*3/uL (ref 0.00–0.07)
Basophils Absolute: 0.1 10*3/uL (ref 0.0–0.1)
Basophils Relative: 2 %
Eosinophils Absolute: 0.3 10*3/uL (ref 0.0–0.5)
Eosinophils Relative: 5 %
HCT: 42.6 % (ref 36.0–46.0)
Hemoglobin: 13.9 g/dL (ref 12.0–15.0)
Immature Granulocytes: 1 %
Lymphocytes Relative: 27 %
Lymphs Abs: 1.8 10*3/uL (ref 0.7–4.0)
MCH: 30.4 pg (ref 26.0–34.0)
MCHC: 32.6 g/dL (ref 30.0–36.0)
MCV: 93.2 fL (ref 80.0–100.0)
Monocytes Absolute: 0.8 10*3/uL (ref 0.1–1.0)
Monocytes Relative: 12 %
Neutro Abs: 3.6 10*3/uL (ref 1.7–7.7)
Neutrophils Relative %: 53 %
Platelet Count: 440 10*3/uL — ABNORMAL HIGH (ref 150–400)
RBC: 4.57 MIL/uL (ref 3.87–5.11)
RDW: 12.2 % (ref 11.5–15.5)
WBC Count: 6.7 10*3/uL (ref 4.0–10.5)
nRBC: 0 % (ref 0.0–0.2)

## 2018-10-25 NOTE — Progress Notes (Signed)
[facility] [phone] [facility]) Dr. Julien Nordmann referred patient to study this morning when patient was in clinic for appointment with him. I met with patient, alone, after her appointment with Dr. Julien Nordmann, and patient confirms that Dr. Julien Nordmann gave her a brief overview of study. I also gave patient a brief overview and provided her with the study consent form for her review as well as a Clinical Trials information pamphlet and my contact information. All patient's questions answered to her satisfaction, patient was thanked for her time and interest in study and encouraged to call Dr. Julien Nordmann or myself with questions. Patient informed I will be checking study eligibility in the meantime.  Approximately 10 minutes was spent with patient. Maxwell Marion, RN, BSN, [facility] Clinical Research 10/25/2018 1:43 PM

## 2018-10-25 NOTE — Progress Notes (Signed)
[place] [phone]   [phone]  CONSULT NOTE  REFERRING PHYSICIAN: Dr. Modesto Charon  REASON FOR CONSULTATION:  73 years old African-American female with recently diagnosed lung cancer.  HPI Stephanie Crawford is a 73 y.o. female and never smoker with past medical history significant for seasonal allergy, anemia, GERD, heart murmur, shingles as well as kidney stone.  The patient had an episode of kidney stone in April 2018 and CT scan of the abdomen was performed at that time.  This scan showed left UVJ stone but it also showed incidental finding of a 3 mm pulmonary nodule in the right lower lobe.  This was followed by regular CT scan of the chest initially on November 10, 2016 and it showed 1.6 cm irregular nodule at the posterior right lung base.  There was additionally scattered subpleural nodular opacities in the bilateral lower lobes.  She was followed by CT scan of the chest every 6 months for 2 years with no significant change.  Repeat CT scan of the chest on July 31, 2018 showed subsequent 2.0 cm basilar right lower lobe pulmonary nodule with 0.4 cm solid component, mildly increased in size in the interval.  Slow-growing primary bronchogenic adenocarcinoma cannot be excluded.  There was also a separate 1.6 cm peripheral right lower lobe sub-solid pulmonary nodule that is stable.  There was no thoracic adenopathy and no pleural effusions.  A PET scan on August 15, 2018 showed low level hypermetabolism within the sub-solid 2.1 cm basilar right lower lobe pulmonary nodule which have mildly increased in size from the previous CT scan and slowly growing primary bronchogenic abdomen.  There was also low-level metabolic with maximum SUV of 10.3 within the sub-solid 1.6 cm.  Her right lower lobe nodule which was a stable in size on recent CT and continued surveillance was recommended.  No hypermetabolic thoracic adenopathy or distant metastatic disease. The  patient was referred to Dr. Roxan Hockey and on 10/09/2018 she underwent right VATS with wedge resection x3 of the right lower lobe and lymph node sampling.  The final pathology ([id number]) showed 3 different lesions for differentiated adenocarcinoma the first 1 is spanning 0.3 cm, the second was 1.9 cm and the third was 0.3 cm.  The final pathologic stage was T3, N0. Dr. Roxan Hockey kindly referred the patient to me today for evaluation and recommendation regarding adjuvant therapy. When seen today the patient is feeling fine except for shortness of breath with exertion.  She had postoperative right pleural effusion and she underwent ultrasound-guided thoracentesis yesterday with removal 750 ml of pleural fluid.  The patient also has some discomfort on the right side of the chest secondary to her recent surgery.  She denied having any cough or hemoptysis.  She has no nausea, vomiting, diarrhea or constipation.  She denied having headache or visual changes. Family history significant for father with prostate cancer, maternal grandmother with lung cancer and a brother with heart disease. The patient is married and has 5 children.  She works for [employer].  She has no history for smoking, alcohol or drug abuse.  HPI  Past Medical History:  Diagnosis Date   Allergic rhinitis    Allergy    Anemia    prior to hysterectomy--1997   GERD (gastroesophageal reflux disease)    Heart murmur    Hemorrhoid 2011   History of shingles 04/2014   Nodule of lower lobe of right lung     Past Surgical History:  Procedure Laterality Date   ABDOMINAL HYSTERECTOMY  [place] [facility]  9/11   8/27 had a follow up procedure.     IR THORACENTESIS ASP PLEURAL SPACE W/IMG GUIDE  10/24/2018   LIPOMA EXCISION  1975   neck, left breast and righ jaw.   VIDEO ASSISTED THORACOSCOPY (VATS)/WEDGE RESECTION Right 10/09/2018   Procedure: VIDEO ASSISTED THORACOSCOPY (VATS)/WEDGE  RESECTION;  Surgeon: Melrose Nakayama, MD;  Location: [facility] OR;  Service: Thoracic;  Laterality: Right;    Family History  Problem Relation Age of Onset   Prostate cancer Father    Lung cancer Maternal Grandmother    Asthma Daughter    Asthma Sister    Breast cancer Brother    Heart attack Brother     Social History Social History   Tobacco Use   Smoking status: Never Smoker   Smokeless tobacco: Never Used  Substance Use Topics   Alcohol use: No    Alcohol/week: 0.0 standard drinks   Drug use: No    Allergies  Allergen Reactions   Adhesive [Tape] Rash    Current Outpatient Medications  Medication Sig Dispense Refill   aspirin 325 MG EC tablet Take 650 mg by mouth daily as needed for pain.     azelastine (OPTIVAR) 0.05 % ophthalmic solution Place 1 drop into both eyes daily as needed (irritation).     calcium carbonate (OS-CAL) 600 MG TABS tablet Take 1,200 mg by mouth every evening.      Cholecalciferol (VITAMIN D) 50 MCG (2000 UT) tablet Take 2,000 Units by mouth at bedtime.     diclofenac sodium (VOLTAREN) 1 % GEL APPLY 3 GRAMS TO 3 LARGE JOINTS UP TO 3 TIMES DAILY. (Patient taking differently: Apply 3 g topically 2 (two) times daily as needed (pain). ) 300 g 3   Omega-3 1000 MG CAPS Take 1,000 mg by mouth at bedtime.     oxycodone (OXY-IR) 5 MG capsule Take 1 capsule (5 mg total) by mouth every 6 (six) hours as needed. 25 capsule 0   No current facility-administered medications for this visit.     Review of Systems  Constitutional: negative Eyes: negative Ears, nose, mouth, throat, and face: negative Respiratory: positive for cough, dyspnea on exertion and pleurisy/chest pain Cardiovascular: negative Gastrointestinal: negative Genitourinary:negative Integument/breast: negative Hematologic/lymphatic: negative Musculoskeletal:negative Neurological: negative Behavioral/Psych: negative Endocrine: negative Allergic/Immunologic:  negative  Physical Exam  QQP:YPPJK, healthy, no distress, well nourished and well developed SKIN: skin color, texture, turgor are normal, no rashes or significant lesions HEAD: Normocephalic, No masses, lesions, tenderness or abnormalities EYES: normal, PERRLA, Conjunctiva are pink and non-injected EARS: External ears normal, Canals clear OROPHARYNX:no exudate, no erythema and lips, buccal mucosa, and tongue normal  NECK: supple, no adenopathy, no JVD LYMPH:  no palpable lymphadenopathy, no hepatosplenomegaly BREAST:not examined LUNGS: clear to auscultation , and palpation HEART: regular rate & rhythm, no murmurs and no gallops ABDOMEN:abdomen soft, non-tender, normal bowel sounds and no masses or organomegaly BACK: No CVA tenderness, Range of motion is normal EXTREMITIES:no joint deformities, effusion, or inflammation, no edema  NEURO: alert & oriented x 3 with fluent speech, no focal motor/sensory deficits  PERFORMANCE STATUS: ECOG 1  LABORATORY DATA: Lab Results  Component Value Date   WBC 6.7 10/25/2018   HGB 13.9 10/25/2018   HCT 42.6 10/25/2018   MCV 93.2 10/25/2018   PLT 440 (H) 10/25/2018      Chemistry      Component Value Date/Time  NA 141 10/25/2018 0949   K 4.1 10/25/2018 0949   CL 104 10/25/2018 0949   CO2 26 10/25/2018 0949   BUN 10 10/25/2018 0949   CREATININE 0.84 10/25/2018 0949   CREATININE 0.75 07/14/2014 1231      Component Value Date/Time   CALCIUM 9.5 10/25/2018 0949   CALCIUM 10.1 12/28/2009 2231   ALKPHOS 118 10/25/2018 0949   AST 12 (L) 10/25/2018 0949   ALT 13 10/25/2018 0949   BILITOT 1.4 (H) 10/25/2018 0949       RADIOGRAPHIC STUDIES: Dg Chest 1 View  Result Date: 10/24/2018 CLINICAL DATA:  Post right-sided thoracentesis EXAM: CHEST  1 VIEW COMPARISON:  10/22/2018 FINDINGS: Interval reduction and persistent small right-sided effusion post thoracentesis. No pneumothorax. Improved aeration of the right lung base with persistent right  basilar opacities, likely atelectasis. Left basilar/retrocardiac linear heterogeneous opacities are unchanged and favored to represent atelectasis or scar. No evidence of edema. Unchanged cardiac silhouette and mediastinal contours with atherosclerotic plaque within the thoracic aorta. Thoracic aorta again appears mildly tortuous. No acute osseous abnormalities. IMPRESSION: 1. Interval reduction in persistent small right-sided effusion post thoracentesis. No pneumothorax. 2. Improved aeration of the right lung base with persistent bibasilar opacities, likely atelectasis. 3. Similar findings of lung hyperexpansion. Electronically Signed   By: Sandi Mariscal M.D.   On: 10/24/2018 09:48   Dg Chest 2 View  Result Date: 10/22/2018 CLINICAL DATA:  History of VATS and partial right lung resection 10/09/2018. EXAM: CHEST - 2 VIEW COMPARISON:  Radiographs 10/12/2018 and 10/11/2018. Chest CT 07/31/2018. FINDINGS: Stable volume loss in the right hemithorax status post right lower lobectomy. There is improved aeration of the right lung base. A small right-sided pleural effusion versus pleural thickening remains. There is no pneumothorax. The left lung is clear. The heart size and mediastinal contours are stable. IMPRESSION: Improving pulmonary aeration with small residual right pleural effusion. No pneumothorax. Electronically Signed   By: Richardean Sale M.D.   On: 10/22/2018 16:51   Dg Chest 2 View  Result Date: 10/12/2018 CLINICAL DATA:  Status post right partial lobectomy. Recent chest tube removal EXAM: CHEST - 2 VIEW COMPARISON:  Oct 10, 2008 FINDINGS: No pneumothorax evident. There is volume loss on the right with elevation the right hemidiaphragm, at least in part due to recent partial lobectomy on the right. There is bibasilar atelectatic change with small right pleural effusion. No airspace consolidation evident. Heart is upper normal in size with pulmonary vascularity normal. No adenopathy. No bone lesions.  IMPRESSION: No appreciable pneumothorax. Postoperative change on the right with volume loss. Bibasilar atelectasis small right pleural effusion. No edema or consolidation. Heart size within normal limits. Electronically Signed   By: Lowella Grip III M.D.   On: 10/12/2018 08:31   Dg Chest 2 View  Result Date: 10/08/2018 CLINICAL DATA:  Preop for wedge resection lung abnormality. EXAM: CHEST - 2 VIEW COMPARISON:  08/15/2018 FINDINGS: Normal heart size. Clear lungs. No pneumothorax or pleural effusion. IMPRESSION: No active cardiopulmonary disease. Electronically Signed   By: Marybelle Killings M.D.   On: 10/08/2018 08:35   Dg Chest 1v Repeat Same Day  Result Date: 10/11/2018 CLINICAL DATA:  Status post partial lobectomy EXAM: CHEST - 1 VIEW SAME DAY COMPARISON:  Earlier today FINDINGS: Chest tube removal with no visible pneumothorax. Stable atelectasis and low lung volumes. Stable cardiomegaly. IMPRESSION: 1. Chest tube removal with no pneumothorax. 2. Stable low volumes with atelectasis. Electronically Signed   By: Neva Seat.D.  On: 10/11/2018 10:34   Dg Chest Port 1 View  Result Date: 10/11/2018 CLINICAL DATA:  Partial lobectomy.  Chest tube. EXAM: PORTABLE CHEST 1 VIEW COMPARISON:  Yesterday FINDINGS: Right-sided chest tube in place with tip at the apex. No visible pneumothorax. There is increased atelectasis and pleural fluid on the right where there is postoperative volume loss. Cardiomegaly. IMPRESSION: Increased atelectasis and pleural fluid on the right. No visible pneumothorax. Electronically Signed   By: Monte Fantasia M.D.   On: 10/11/2018 07:27   Dg Chest Port 1 View  Result Date: 10/10/2018 CLINICAL DATA:  Chest tube present. Recent right lung wedge resection EXAM: PORTABLE CHEST 1 VIEW COMPARISON:  Oct 09, 2018 FINDINGS: Chest tube position on the right is unchanged without pneumothorax. There is postoperative change in the right base. There is a small left pleural effusion with  mild left base atelectasis. The lungs elsewhere are clear. Heart is upper normal in size with pulmonary vascularity normal. No adenopathy. IMPRESSION: No pneumothorax. No change in chest tube position. Postoperative change right base with stable ill-defined opacity, likely due to mild postoperative edema. On the left, there is atelectatic change in the base with small left pleural effusion. Lungs elsewhere are clear. Stable cardiac silhouette. Electronically Signed   By: Lowella Grip III M.D.   On: 10/10/2018 08:18   Dg Chest Port 1 View  Result Date: 10/09/2018 CLINICAL DATA:  Wedge resection EXAM: PORTABLE CHEST 1 VIEW COMPARISON:  10/07/2018 FINDINGS: There are interval postsurgical changes of the right lower lobe. A right-sided chest tube is in place. There is no significant right-sided pneumothorax. Hazy airspace opacities are noted throughout the right lower lobe consistent with recent surgical intervention. The left lung field is essentially clear. There is no acute osseous abnormality. IMPRESSION: Postsurgical changes the right lower lobe with no evidence of a right-sided pneumothorax. Electronically Signed   By: Constance Holster M.D.   On: 10/09/2018 13:46   Ir Thoracentesis Asp Pleural Space W/img Guide  Result Date: 10/24/2018 INDICATION: Patient is status post lung resection and right VATS 10/09/2018. Now with small right pleural effusion. Request is made for therapeutic thoracentesis. EXAM: ULTRASOUND GUIDED THERAPEUTIC RIGHT THORACENTESIS MEDICATIONS: 10 mL 1% lidocaine COMPLICATIONS: None immediate. PROCEDURE: An ultrasound guided thoracentesis was thoroughly discussed with the patient and questions answered. The benefits, risks, alternatives and complications were also discussed. The patient understands and wishes to proceed with the procedure. Written consent was obtained. Ultrasound was performed to localize and mark an adequate pocket of fluid in the right chest. The area was then  prepped and draped in the normal sterile fashion. 1% Lidocaine was used for local anesthesia. Under ultrasound guidance a 6 Fr Safe-T-Centesis catheter was introduced. Thoracentesis was performed. The catheter was removed and a dressing applied. FINDINGS: A total of approximately 750 mL of amber fluid was removed. Samples were sent to the laboratory as requested by the clinical team. IMPRESSION: Successful ultrasound guided therapeutic right thoracentesis yielding 750 mL of pleural fluid. Read by: Brynda Greathouse PA-C Electronically Signed   By: Jerilynn Mages.  Shick M.D.   On: 10/24/2018 10:35    ASSESSMENT: This is a very pleasant 73 years old African-American female recently diagnosed with a stage IIB (descending, N0, M0) non-small cell lung cancer, adenocarcinoma diagnosed in May 2020 status post wedge resection x3 of the right lower lobe with lymph node sampling under the care of Dr. Roxan Hockey on 10/09/2018.   PLAN: I had a lengthy discussion with the patient today about her  current disease stage, prognosis and treatment options. I recommended for the patient to complete the staging work-up by ordering MRI of the brain to rule out brain metastasis. I discussed with the patient the adjuvant option for her condition. I gave the patient the option of treatment with 4 cycles of platinum based therapy with cisplatin and Alimta every 3 weeks versus enrollment and the alchemist clinical trial for the patient is to be randomized adjuvant systemic chemotherapy by observation versus adjuvant treatment with systemic chemotherapy followed by targeted therapy or targeted therapy alone if the patient has actionable mutations. The patient was interested in receiving more information about the clinical trial and she will be seen by 1 of the clinical research nurses after the visit today. I will arrange for the patient to come back for follow-up visit in 3 weeks for reevaluation and more detailed discussion of her treatment  options based on the molecular profile. She was advised to call immediately if she has any concerning symptoms in the interval.  The patient voices understanding of current disease status and treatment options and is in agreement with the current care plan.  All questions were answered. The patient knows to call the clinic with any problems, questions or concerns. We can certainly see the patient much sooner if necessary.  Thank you so much for allowing me to participate in the care of Stephanie Crawford. I will continue to follow up the patient with you and assist in her care.  I spent 55 minutes counseling the patient face to face. The total time spent in the appointment was 80 minutes.  Disclaimer: This note was dictated with voice recognition software. Similar sounding words can inadvertently be transcribed and may not be corrected upon review.   Eilleen Kempf October 25, 2018, 10:37 AM

## 2018-10-25 NOTE — Progress Notes (Signed)
Oncology Nurse Navigator Documentation  Oncology Nurse Navigator Flowsheets 10/25/2018  Navigator Location [facility]  Referral date to RadOnc/MedOnc -  Navigator Encounter Type Clinic/MDC/I spoke with patient at cancer center today.  He treatment plan is either 4 cycles of chemo or clinical trial.  She is speaking with a clinical trial nurse today.  I gave and explained information on DX,TX, and resources to help her through treatment.    Telephone -  Abnormal Finding Date 07/31/2018  Confirmed Diagnosis Date 10/09/2018  Surgery Date 10/09/2018  Treatment Initiated Date 10/09/2018  Patient Visit Type MedOnc  Treatment Phase Other  Barriers/Navigation Needs Education  Education Understanding Cancer/ Treatment Options;Newly Diagnosed Cancer Education;Other  Interventions Education  Coordination of Care -  Education Method Verbal;Written  Acuity Level 2  Time Spent with Patient 30

## 2018-10-28 ENCOUNTER — Telehealth: Payer: Self-pay | Admitting: Internal Medicine

## 2018-10-28 NOTE — Telephone Encounter (Signed)
Scheduled appt per 6/5 los - pt is aware of apt date and time

## 2018-10-29 ENCOUNTER — Encounter (HOSPITAL_COMMUNITY): Payer: Self-pay | Admitting: Internal Medicine

## 2018-10-29 ENCOUNTER — Ambulatory Visit: Payer: BLUE CROSS/BLUE SHIELD | Admitting: Thoracic Surgery (Cardiothoracic Vascular Surgery)

## 2018-10-29 DIAGNOSIS — C3492 Malignant neoplasm of unspecified part of left bronchus or lung: Secondary | ICD-10-CM | POA: Diagnosis not present

## 2018-10-30 ENCOUNTER — Telehealth: Payer: Self-pay | Admitting: Medical Oncology

## 2018-10-30 ENCOUNTER — Other Ambulatory Visit: Payer: Self-pay | Admitting: Medical Oncology

## 2018-10-30 DIAGNOSIS — F40240 Claustrophobia: Secondary | ICD-10-CM

## 2018-10-30 MED ORDER — LORAZEPAM 0.5 MG PO TABS: 0.5000 mg | ORAL_TABLET | Freq: Once | ORAL | 0 refills | Status: AC

## 2018-10-30 NOTE — Telephone Encounter (Signed)
Claustrophobia with MRI - request something to take to relax her.

## 2018-10-30 NOTE — Telephone Encounter (Signed)
Ok

## 2018-10-30 NOTE — Telephone Encounter (Signed)
Called in ativan for pt who has claustrophia with MRI. Pt notified.

## 2018-10-31 ENCOUNTER — Other Ambulatory Visit: Payer: Self-pay

## 2018-10-31 ENCOUNTER — Ambulatory Visit (HOSPITAL_COMMUNITY)
Admission: RE | Admit: 2018-10-31 | Discharge: 2018-10-31 | Disposition: A | Discharge status: Home / Self Care | Payer: BC Managed Care – PPO | Source: Ambulatory Visit | Attending: Internal Medicine | Admitting: Internal Medicine

## 2018-10-31 DIAGNOSIS — C3490 Malignant neoplasm of unspecified part of unspecified bronchus or lung: Secondary | ICD-10-CM

## 2018-10-31 MED ORDER — GADOBUTROL 1 MMOL/ML IV SOLN
10.0000 mL | Freq: Once | INTRAVENOUS | Status: AC | PRN
  Administered 2018-10-31: 9 mL via INTRAVENOUS

## 2018-11-11 ENCOUNTER — Other Ambulatory Visit: Payer: Self-pay | Admitting: Thoracic Surgery (Cardiothoracic Vascular Surgery)

## 2018-11-11 ENCOUNTER — Other Ambulatory Visit: Payer: Self-pay

## 2018-11-11 DIAGNOSIS — C3491 Malignant neoplasm of unspecified part of right bronchus or lung: Secondary | ICD-10-CM

## 2018-11-12 ENCOUNTER — Encounter: Payer: Self-pay | Admitting: Thoracic Surgery (Cardiothoracic Vascular Surgery)

## 2018-11-12 ENCOUNTER — Ambulatory Visit (INDEPENDENT_AMBULATORY_CARE_PROVIDER_SITE_OTHER): Payer: Self-pay | Admitting: Thoracic Surgery (Cardiothoracic Vascular Surgery)

## 2018-11-12 ENCOUNTER — Ambulatory Visit
Admission: RE | Admit: 2018-11-12 | Discharge: 2018-11-12 | Disposition: A | Discharge status: Home / Self Care | Payer: BC Managed Care – PPO | Source: Ambulatory Visit | Attending: Thoracic Surgery (Cardiothoracic Vascular Surgery) | Admitting: Thoracic Surgery (Cardiothoracic Vascular Surgery)

## 2018-11-12 VITALS — BP 139/78 | HR 88 | Temp 97.8°F | Resp 20 | Ht 70.0 in | Wt 212.0 lb

## 2018-11-12 DIAGNOSIS — Z902 Acquired absence of lung [part of]: Secondary | ICD-10-CM

## 2018-11-12 DIAGNOSIS — C3491 Malignant neoplasm of unspecified part of right bronchus or lung: Secondary | ICD-10-CM

## 2018-11-12 DIAGNOSIS — J90 Pleural effusion, not elsewhere classified: Secondary | ICD-10-CM

## 2018-11-12 DIAGNOSIS — R0602 Shortness of breath: Secondary | ICD-10-CM | POA: Diagnosis not present

## 2018-11-12 MED ORDER — PREDNISONE 10 MG (21) PO TBPK: ORAL_TABLET | ORAL | 0 refills | Status: AC

## 2018-11-12 NOTE — Progress Notes (Signed)
[address]       [place] [address]             [phone]     HPI: [name] returns for scheduled follow-up visit  Stephanie Crawford is a 73 year old woman who was found to have lung nodules in 2018 after presenting with a kidney stone.  She was followed with CT scans.  Over time she had increase in the size of a basilar nodule.  There was some mild uptake in that nodule as well as a second sub-solid nodule.  She had multiple other tiny nodules bilaterally.  She is a lifelong non-smoker.  I did a right VATS with multiple wedge resections on 10/09/2018.  Both of the sub-solid nodules were resected and turned out to be adenocarcinomas.  A tiny third nodule about 3 mm in diameter was resected and turned out to be an adenocarcinoma as well.  Her postoperative course was uncomplicated.  She went home on day 4.  I saw her back in the office on 10/22/2018.  She was having a lot of pain at that time.  We gave her an additional prescription of oxycodone.  She still has some discomfort but is no longer taking oxycodone.  Her discomfort is less than it was previously.  Past Medical History:  Diagnosis Date  . Allergic rhinitis   . Allergy   . Anemia    prior to hysterectomy--1997  . GERD (gastroesophageal reflux disease)   . Heart murmur   . Hemorrhoid 2011  . History of shingles 04/2014  . Nodule of lower lobe of right lung     Current Outpatient Medications  Medication Sig Dispense Refill  . aspirin 325 MG EC tablet Take 650 mg by mouth daily as needed for pain.    Marland Kitchen azelastine (OPTIVAR) 0.05 % ophthalmic solution Place 1 drop into both eyes daily as needed (irritation).    . calcium carbonate (OS-CAL) 600 MG TABS tablet Take 1,200 mg by mouth every evening.     . Cholecalciferol (VITAMIN D) 50 MCG (2000 UT) tablet Take 2,000 Units by mouth at bedtime.    . diclofenac sodium (VOLTAREN) 1 % GEL APPLY 3 GRAMS TO 3 LARGE JOINTS UP TO 3 TIMES DAILY. (Patient  taking differently: Apply 3 g topically 2 (two) times daily as needed (pain). ) 300 g 3  . Omega-3 1000 MG CAPS Take 1,000 mg by mouth at bedtime.    Marland Kitchen oxycodone (OXY-IR) 5 MG capsule Take 1 capsule (5 mg total) by mouth every 6 (six) hours as needed. 25 capsule 0  . predniSONE (STERAPRED UNI-PAK 21 TAB) 10 MG (21) TBPK tablet Take 6 tablets (60 mg total) by mouth daily for 1 day, THEN 5 tablets (50 mg total) daily for 1 day, THEN 4 tablets (40 mg total) daily for 1 day, THEN 3 tablets (30 mg total) daily for 1 day, THEN 2 tablets (20 mg total) daily for 1 day, THEN 1 tablet (10 mg total) daily for 1 day. 21 tablet 0   No current facility-administered medications for this visit.     Physical Exam BP 139/78   Pulse 88   Temp 97.8 F (36.6 C) (Skin)   Resp 20   Ht 5\' 10"  (1.778 m)   Wt 212 lb (96.2 kg)   LMP 05/23/1995   SpO2 96% Comment: RA  BMI 30.42 kg/m  73 year old woman in no acute distress Alert and oriented x3 with no focal  deficits Lungs minimally diminished at right base Cardiac regular rate and rhythm No peripheral edema  Diagnostic Tests: CHEST - 2 VIEW  COMPARISON:  Chest x-ray dated October 24, 2018.  FINDINGS: The heart size and mediastinal contours are within normal limits. Normal pulmonary vascularity. Slightly increased small loculated right pleural effusion with fluid extending into the inferior major fissure. Slightly worsened right lower lobe atelectasis. The left lung is clear. No pneumothorax. The lungs remain hyperinflated. No acute osseous abnormality.  IMPRESSION: 1. Slightly increased small loculated right pleural effusion.   Electronically Signed   By: Titus Dubin M.D.   On: 11/12/2018 15:03   Impression: Stephanie Crawford is a 44 year old woman who had a right VATS and wedge resection of multiple nodules which turned out to be adenocarcinomas.  She is a lifelong non-smoker.  Her recovery continues to progress as expected.  Her pain  has improved to the point she is no longer requiring narcotics.  There are no restrictions on her activities at this point but she should build into new activities gradually.  Postoperative right pleural effusion-chest x-ray today shows small pleural effusion but definitely bigger than it was initially postthoracentesis.  There is not enough to warrant thoracentesis.  I recommended that we try a prednisone taper.  I will see her back in about a month and we will check on that again.  She is scheduled to see Dr. Julien Nordmann on Thursday to further discuss adjuvant chemotherapy.  I recommended she have treatment.  Plan: Prednisone taper Return in 1 month with PA and lateral chest x-ray Follow-up with Dr. Julien Nordmann as scheduled   Melrose Nakayama, MD [facility] [phone]

## 2018-11-13 ENCOUNTER — Ambulatory Visit: Payer: BLUE CROSS/BLUE SHIELD | Admitting: Acute Care

## 2018-11-14 ENCOUNTER — Inpatient Hospital Stay: Payer: BC Managed Care – PPO

## 2018-11-14 ENCOUNTER — Other Ambulatory Visit: Payer: Self-pay

## 2018-11-14 ENCOUNTER — Encounter: Payer: Self-pay | Admitting: Internal Medicine

## 2018-11-14 ENCOUNTER — Telehealth: Payer: Self-pay | Admitting: Internal Medicine

## 2018-11-14 ENCOUNTER — Inpatient Hospital Stay (HOSPITAL_BASED_OUTPATIENT_CLINIC_OR_DEPARTMENT_OTHER): Payer: BC Managed Care – PPO | Admitting: Internal Medicine

## 2018-11-14 VITALS — BP 136/83 | HR 105 | Temp 97.8°F | Resp 18 | Ht 70.0 in | Wt 209.4 lb

## 2018-11-14 DIAGNOSIS — C3491 Malignant neoplasm of unspecified part of right bronchus or lung: Secondary | ICD-10-CM

## 2018-11-14 DIAGNOSIS — J90 Pleural effusion, not elsewhere classified: Secondary | ICD-10-CM | POA: Diagnosis not present

## 2018-11-14 DIAGNOSIS — Z5111 Encounter for antineoplastic chemotherapy: Secondary | ICD-10-CM

## 2018-11-14 DIAGNOSIS — Z902 Acquired absence of lung [part of]: Secondary | ICD-10-CM

## 2018-11-14 DIAGNOSIS — C3490 Malignant neoplasm of unspecified part of unspecified bronchus or lung: Secondary | ICD-10-CM

## 2018-11-14 DIAGNOSIS — C3431 Malignant neoplasm of lower lobe, right bronchus or lung: Secondary | ICD-10-CM

## 2018-11-14 DIAGNOSIS — Z7189 Other specified counseling: Secondary | ICD-10-CM

## 2018-11-14 DIAGNOSIS — R0602 Shortness of breath: Secondary | ICD-10-CM | POA: Diagnosis not present

## 2018-11-14 DIAGNOSIS — D649 Anemia, unspecified: Secondary | ICD-10-CM | POA: Diagnosis not present

## 2018-11-14 LAB — CBC WITH DIFFERENTIAL (CANCER CENTER ONLY)
Abs Immature Granulocytes: 0.04 10*3/uL (ref 0.00–0.07)
Basophils Absolute: 0.1 10*3/uL (ref 0.0–0.1)
Basophils Relative: 0 %
Eosinophils Absolute: 0 10*3/uL (ref 0.0–0.5)
Eosinophils Relative: 0 %
HCT: 41.7 % (ref 36.0–46.0)
Hemoglobin: 13.9 g/dL (ref 12.0–15.0)
Immature Granulocytes: 0 %
Lymphocytes Relative: 16 %
Lymphs Abs: 1.9 10*3/uL (ref 0.7–4.0)
MCH: 30.2 pg (ref 26.0–34.0)
MCHC: 33.3 g/dL (ref 30.0–36.0)
MCV: 90.5 fL (ref 80.0–100.0)
Monocytes Absolute: 0.6 10*3/uL (ref 0.1–1.0)
Monocytes Relative: 5 %
Neutro Abs: 9.3 10*3/uL — ABNORMAL HIGH (ref 1.7–7.7)
Neutrophils Relative %: 79 %
Platelet Count: 327 10*3/uL (ref 150–400)
RBC: 4.61 MIL/uL (ref 3.87–5.11)
RDW: 12.2 % (ref 11.5–15.5)
WBC Count: 11.9 10*3/uL — ABNORMAL HIGH (ref 4.0–10.5)
nRBC: 0 % (ref 0.0–0.2)

## 2018-11-14 LAB — CMP (CANCER CENTER ONLY)
ALT: 15 U/L (ref 0–44)
AST: 15 U/L (ref 15–41)
Albumin: 4.1 g/dL (ref 3.5–5.0)
Alkaline Phosphatase: 97 U/L (ref 38–126)
Anion gap: 11 (ref 5–15)
BUN: 14 mg/dL (ref 8–23)
CO2: 25 mmol/L (ref 22–32)
Calcium: 10.1 mg/dL (ref 8.9–10.3)
Chloride: 105 mmol/L (ref 98–111)
Creatinine: 1.18 mg/dL — ABNORMAL HIGH (ref 0.44–1.00)
GFR, Est AFR Am: 53 mL/min — ABNORMAL LOW (ref >60)
GFR, Estimated: 46 mL/min — ABNORMAL LOW (ref >60)
Glucose, Bld: 130 mg/dL — ABNORMAL HIGH (ref 70–99)
Potassium: 3.9 mmol/L (ref 3.5–5.1)
Sodium: 141 mmol/L (ref 135–145)
Total Bilirubin: 1 mg/dL (ref 0.3–1.2)
Total Protein: 7.6 g/dL (ref 6.5–8.1)

## 2018-11-14 MED ORDER — FOLIC ACID 1 MG PO TABS: 1.0000 mg | ORAL_TABLET | Freq: Every day | ORAL | 4 refills | Status: DC

## 2018-11-14 MED ORDER — CYANOCOBALAMIN 1000 MCG/ML IJ SOLN
1000.0000 ug | Freq: Once | INTRAMUSCULAR | Status: AC
  Administered 2018-11-14: 12:00:00 1000 ug via INTRAMUSCULAR

## 2018-11-14 MED ORDER — PROCHLORPERAZINE MALEATE 10 MG PO TABS: 10.0000 mg | ORAL_TABLET | Freq: Four times a day (QID) | ORAL | 0 refills | Status: DC | PRN

## 2018-11-14 MED ORDER — CYANOCOBALAMIN 1000 MCG/ML IJ SOLN
INTRAMUSCULAR | Status: AC
  Filled 2018-11-14: qty 1

## 2018-11-14 MED ORDER — DEXAMETHASONE 4 MG PO TABS: ORAL_TABLET | ORAL | 0 refills | Status: DC

## 2018-11-14 NOTE — Progress Notes (Signed)
[facility] [phone]   [phone]  OFFICE PROGRESS NOTE  [facility], [facility] [address] [address]  DIAGNOSIS: Stage IIB (T3, N0, M0) non-small cell lung cancer, adenocarcinoma diagnosed in May 2020.  Biomarker Findings Microsatellite status - Cannot Be Determined Tumor Mutational Burden - Cannot Be Determined Genomic Findings For a complete list of the genes assayed, please refer to the Appendix. KRAS G12V TP53 G244V 7 Disease relevant genes with no reportable alterations: ALK, BRAF, EGFR, ERBB2, MET, RET, ROS1   PDL 1 expression: negative   PRIOR THERAPY: Status post wedge resection x3 of the right lower lobe with lymph node sampling under the care of Dr. Roxan Hockey on 10/09/2018.  CURRENT THERAPY: Adjuvant systemic chemotherapy with cisplatin 75 mg/M2 and Alimta 500 mg/M2 every 3 weeks.  First dose November 22, 2018  INTERVAL HISTORY: Stephanie Crawford 73 y.o. female returns to the clinic today for follow-up visit.  The patient is feeling fine today with no concerning complaints.  She had MRI of the brain performed at after her last visit and that showed no evidence of metastatic disease to the brain.  The patient was also seen recently by Dr. Roxan Hockey and repeat chest x-ray showed a slightly increase the small loculated right pleural effusion and she was given a tapered dose of prednisone.  She is feeling fine today with no concerning complaints.  She denied having any chest pain, shortness of breath, cough or hemoptysis.  She denied having any nausea, vomiting, diarrhea or constipation.  She has no headache or visual changes.  She has no fever or chills.  The patient had molecular studies by foundation 1 that showed no actionable mutations and PDL 1 expression was 0%.  She is here today for evaluation and discussion of her treatment options.  MEDICAL HISTORY: Past Medical History:    Diagnosis Date   Allergic rhinitis    Allergy    Anemia    prior to hysterectomy--1997   GERD (gastroesophageal reflux disease)    Heart murmur    Hemorrhoid 2011   History of shingles 04/2014   Nodule of lower lobe of right lung     ALLERGIES:  is allergic to adhesive [tape].  MEDICATIONS:  Current Outpatient Medications  Medication Sig Dispense Refill   aspirin 325 MG EC tablet Take 650 mg by mouth daily as needed for pain.     azelastine (OPTIVAR) 0.05 % ophthalmic solution Place 1 drop into both eyes daily as needed (irritation).     calcium carbonate (OS-CAL) 600 MG TABS tablet Take 1,200 mg by mouth every evening.      Cholecalciferol (VITAMIN D) 50 MCG (2000 UT) tablet Take 2,000 Units by mouth at bedtime.     diclofenac sodium (VOLTAREN) 1 % GEL APPLY 3 GRAMS TO 3 LARGE JOINTS UP TO 3 TIMES DAILY. (Patient taking differently: Apply 3 g topically 2 (two) times daily as needed (pain). ) 300 g 3   LORazepam (ATIVAN) 0.5 MG tablet Take 1 tablet by mouth as directed.     Omega-3 1000 MG CAPS Take 1,000 mg by mouth at bedtime.     oxycodone (OXY-IR) 5 MG capsule Take 1 capsule (5 mg total) by mouth every 6 (six) hours as needed. 25 capsule 0   predniSONE (STERAPRED UNI-PAK 21 TAB) 10 MG (21) TBPK tablet Take 6 tablets (60 mg total) by mouth daily for 1 day, THEN 5 tablets (50  mg total) daily for 1 day, THEN 4 tablets (40 mg total) daily for 1 day, THEN 3 tablets (30 mg total) daily for 1 day, THEN 2 tablets (20 mg total) daily for 1 day, THEN 1 tablet (10 mg total) daily for 1 day. 21 tablet 0   No current facility-administered medications for this visit.     SURGICAL HISTORY:  Past Surgical History:  Procedure Laterality Date   ABDOMINAL HYSTERECTOMY  [facility]  9/11   8/27 had a follow up procedure.     IR THORACENTESIS ASP PLEURAL SPACE W/IMG GUIDE  10/24/2018   LIPOMA EXCISION  1975   neck, left breast and righ  jaw.   VIDEO ASSISTED THORACOSCOPY (VATS)/WEDGE RESECTION Right 10/09/2018   Procedure: VIDEO ASSISTED THORACOSCOPY (VATS)/WEDGE RESECTION;  Surgeon: Melrose Nakayama, MD;  Location: [facility];  Service: Thoracic;  Laterality: Right;    REVIEW OF SYSTEMS:  Constitutional: positive for fatigue Eyes: negative Ears, nose, mouth, throat, and face: negative Respiratory: negative Cardiovascular: negative Gastrointestinal: negative Genitourinary:negative Integument/breast: negative Hematologic/lymphatic: negative Musculoskeletal:negative Neurological: negative Behavioral/Psych: negative Endocrine: negative Allergic/Immunologic: negative   PHYSICAL EXAMINATION: General appearance: alert, cooperative and no distress Head: Normocephalic, without obvious abnormality, atraumatic Neck: no adenopathy, no JVD, supple, symmetrical, trachea midline and thyroid not enlarged, symmetric, no tenderness/mass/nodules Lymph nodes: Cervical, supraclavicular, and axillary nodes normal. Resp: clear to auscultation bilaterally Back: symmetric, no curvature. ROM normal. No CVA tenderness. Cardio: regular rate and rhythm, S1, S2 normal, no murmur, click, rub or gallop GI: soft, non-tender; bowel sounds normal; no masses,  no organomegaly Extremities: extremities normal, atraumatic, no cyanosis or edema Neurologic: Alert and oriented X 3, normal strength and tone. Normal symmetric reflexes. Normal coordination and gait  ECOG PERFORMANCE STATUS: 1 - Symptomatic but completely ambulatory  Blood pressure 136/83, pulse (!) 105, temperature 97.8 F (36.6 C), temperature source Oral, resp. rate 18, height '5\' 10"'  (1.778 m), weight 209 lb 6.4 oz (95 kg), last menstrual period 05/23/1995, SpO2 99 %.  LABORATORY DATA: Lab Results  Component Value Date   WBC 11.9 (H) 11/14/2018   HGB 13.9 11/14/2018   HCT 41.7 11/14/2018   MCV 90.5 11/14/2018   PLT 327 11/14/2018      Chemistry      Component Value Date/Time     NA 141 10/25/2018 0949   K 4.1 10/25/2018 0949   CL 104 10/25/2018 0949   CO2 26 10/25/2018 0949   BUN 10 10/25/2018 0949   CREATININE 0.84 10/25/2018 0949   CREATININE 0.75 07/14/2014 1231      Component Value Date/Time   CALCIUM 9.5 10/25/2018 0949   CALCIUM 10.1 12/28/2009 2231   ALKPHOS 118 10/25/2018 0949   AST 12 (L) 10/25/2018 0949   ALT 13 10/25/2018 0949   BILITOT 1.4 (H) 10/25/2018 0949       RADIOGRAPHIC STUDIES: Dg Chest 1 View  Result Date: 10/24/2018 CLINICAL DATA:  Post right-sided thoracentesis EXAM: CHEST  1 VIEW COMPARISON:  10/22/2018 FINDINGS: Interval reduction and persistent small right-sided effusion post thoracentesis. No pneumothorax. Improved aeration of the right lung base with persistent right basilar opacities, likely atelectasis. Left basilar/retrocardiac linear heterogeneous opacities are unchanged and favored to represent atelectasis or scar. No evidence of edema. Unchanged cardiac silhouette and mediastinal contours with atherosclerotic plaque within the thoracic aorta. Thoracic aorta again appears mildly tortuous. No acute osseous abnormalities. IMPRESSION: 1. Interval reduction in persistent small right-sided effusion post thoracentesis. No pneumothorax. 2.  Improved aeration of the right lung base with persistent bibasilar opacities, likely atelectasis. 3. Similar findings of lung hyperexpansion. Electronically Signed   By: Sandi Mariscal M.D.   On: 10/24/2018 09:48   Dg Chest 2 View  Result Date: 11/12/2018 CLINICAL DATA:  Shortness of breath. Right lower lobe wedge resection on 10/09/2018. EXAM: CHEST - 2 VIEW COMPARISON:  Chest x-ray dated October 24, 2018. FINDINGS: The heart size and mediastinal contours are within normal limits. Normal pulmonary vascularity. Slightly increased small loculated right pleural effusion with fluid extending into the inferior major fissure. Slightly worsened right lower lobe atelectasis. The left lung is clear. No pneumothorax.  The lungs remain hyperinflated. No acute osseous abnormality. IMPRESSION: 1. Slightly increased small loculated right pleural effusion. Electronically Signed   By: Titus Dubin M.D.   On: 11/12/2018 15:03   Dg Chest 2 View  Result Date: 10/22/2018 CLINICAL DATA:  History of VATS and partial right lung resection 10/09/2018. EXAM: CHEST - 2 VIEW COMPARISON:  Radiographs 10/12/2018 and 10/11/2018. Chest CT 07/31/2018. FINDINGS: Stable volume loss in the right hemithorax status post right lower lobectomy. There is improved aeration of the right lung base. A small right-sided pleural effusion versus pleural thickening remains. There is no pneumothorax. The left lung is clear. The heart size and mediastinal contours are stable. IMPRESSION: Improving pulmonary aeration with small residual right pleural effusion. No pneumothorax. Electronically Signed   By: Richardean Sale M.D.   On: 10/22/2018 16:51   Mr Jeri Cos YJ Contrast  Result Date: 10/31/2018 CLINICAL DATA:  Non-small cell lung cancer staging. EXAM: MRI HEAD WITHOUT AND WITH CONTRAST TECHNIQUE: Multiplanar, multiecho pulse sequences of the brain and surrounding structures were obtained without and with intravenous contrast. CONTRAST:  9 mL Gadavist COMPARISON:  None. FINDINGS: BRAIN: There is no acute infarct, acute hemorrhage or extra-axial collection. A partially empty sella is incidentally noted. No midline shift or other mass effect. Multifocal white matter hyperintensity, most commonly due to chronic ischemic microangiopathy. The cerebral and cerebellar volume are age-appropriate. No hydrocephalus. Susceptibility-sensitive sequences show no chronic microhemorrhage or superficial siderosis. No mass lesion. VASCULAR: The major intracranial arterial and venous sinus flow voids are normal. SKULL AND UPPER CERVICAL SPINE: Calvarial bone marrow signal is normal. There is no skull base mass. Visualized upper cervical spine and soft tissues are normal.  SINUSES/ORBITS: No fluid levels or advanced mucosal thickening. No mastoid or middle ear effusion. The orbits are normal. IMPRESSION: No intracranial or calvarial metastatic disease. Electronically Signed   By: Ulyses Jarred M.D.   On: 10/31/2018 20:59   Ir Thoracentesis Asp Pleural Space W/img Guide  Result Date: 10/24/2018 INDICATION: Patient is status post lung resection and right VATS 10/09/2018. Now with small right pleural effusion. Request is made for therapeutic thoracentesis. EXAM: ULTRASOUND GUIDED THERAPEUTIC RIGHT THORACENTESIS MEDICATIONS: 10 mL 1% lidocaine COMPLICATIONS: None immediate. PROCEDURE: An ultrasound guided thoracentesis was thoroughly discussed with the patient and questions answered. The benefits, risks, alternatives and complications were also discussed. The patient understands and wishes to proceed with the procedure. Written consent was obtained. Ultrasound was performed to localize and mark an adequate pocket of fluid in the right chest. The area was then prepped and draped in the normal sterile fashion. 1% Lidocaine was used for local anesthesia. Under ultrasound guidance a 6 Fr Safe-T-Centesis catheter was introduced. Thoracentesis was performed. The catheter was removed and a dressing applied. FINDINGS: A total of approximately 750 mL of amber fluid was removed. Samples were sent to  the laboratory as requested by the clinical team. IMPRESSION: Successful ultrasound guided therapeutic right thoracentesis yielding 750 mL of pleural fluid. Read by: Brynda Greathouse PA-C Electronically Signed   By: Jerilynn Mages.  Shick M.D.   On: 10/24/2018 10:35    ASSESSMENT AND PLAN: This is a very pleasant 73 years old African-American female with stage IIb (T3, N0, M0) non-small cell lung cancer, adenocarcinoma with multifocal disease in the right lower lobe status post wedge resection x3 of the right lower lobe with lymph node sampling in May 2020. The patient has no actionable mutations and she has  negative PDL 1 expression. I had a lengthy discussion with the patient today about her current condition and treatment options. I recommended for the patient to consider treatment with adjuvant platinum based chemotherapy for 4 cycles.  The patient is interested in the treatment and she will be treated with cisplatin 75 mg/M2 and Alimta 500 mg/M2 every 3 weeks for 4 cycles. I reminded the patient of the adverse effect of this treatment including but not limited to alopecia, myelosuppression, nausea and vomiting, peripheral neuropathy, liver or renal dysfunction as well as hearing deficit. The patient will receive vitamin B12 injection today. I will give her prescription for folic acid 1 mg p.o. daily, Compazine 10 mg p.o. every 6 hours as needed for nausea as well as Decadron 4 mg p.o. twice daily the day before, day of and day after chemotherapy every 3 weeks. She is expected to start the first cycle of this treatment on November 22, 2018 and she will have a chemotherapy education class before the first dose of her treatment. I will see her back for follow-up visit before starting cycle #2. The patient was advised to call immediately if she has any concerning symptoms in the interval. The patient voices understanding of current disease status and treatment options and is in agreement with the current care plan.  All questions were answered. The patient knows to call the clinic with any problems, questions or concerns. We can certainly see the patient much sooner if necessary.  I spent 15 minutes counseling the patient face to face. The total time spent in the appointment was 25 minutes.  Disclaimer: This note was dictated with voice recognition software. Similar sounding words can inadvertently be transcribed and may not be corrected upon review.

## 2018-11-14 NOTE — Progress Notes (Signed)
START ON PATHWAY REGIMEN - Non-Small Cell Lung     A cycle is every 21 days:     Pemetrexed      Cisplatin   **Always confirm dose/schedule in your pharmacy ordering system**  Patient Characteristics: Stage IIB - III - Resected (Adjuvant), No Prior Chemotherapy, Nonsquamous Cell AJCC T Category: T3 Current Disease Status: No Distant Mets or Local Recurrence AJCC N Category: N0 AJCC M Category: M0 AJCC 8 Stage Grouping: IIB Histology: Nonsquamous Cell Intent of Therapy: Curative Intent, Discussed with Patient

## 2018-11-14 NOTE — Telephone Encounter (Signed)
Called and spoke with patient. Confirmed appts. Advise first treatment on 7/2 will be added at a later date and I will call back to inform her of what time

## 2018-11-15 ENCOUNTER — Telehealth: Payer: Self-pay | Admitting: Internal Medicine

## 2018-11-15 NOTE — Telephone Encounter (Signed)
Called and left msg for patient advising of added appt for her first treatment.

## 2018-11-19 ENCOUNTER — Inpatient Hospital Stay: Payer: BC Managed Care – PPO

## 2018-11-19 ENCOUNTER — Other Ambulatory Visit: Payer: Self-pay

## 2018-11-19 ENCOUNTER — Telehealth: Payer: Self-pay | Admitting: *Deleted

## 2018-11-19 DIAGNOSIS — C3491 Malignant neoplasm of unspecified part of right bronchus or lung: Secondary | ICD-10-CM

## 2018-11-19 DIAGNOSIS — C3431 Malignant neoplasm of lower lobe, right bronchus or lung: Secondary | ICD-10-CM | POA: Diagnosis not present

## 2018-11-19 DIAGNOSIS — J90 Pleural effusion, not elsewhere classified: Secondary | ICD-10-CM | POA: Diagnosis not present

## 2018-11-19 DIAGNOSIS — D649 Anemia, unspecified: Secondary | ICD-10-CM | POA: Diagnosis not present

## 2018-11-19 DIAGNOSIS — R0602 Shortness of breath: Secondary | ICD-10-CM | POA: Diagnosis not present

## 2018-11-19 LAB — CMP (CANCER CENTER ONLY)
ALT: 29 U/L (ref 0–44)
AST: 18 U/L (ref 15–41)
Albumin: 3.8 g/dL (ref 3.5–5.0)
Alkaline Phosphatase: 102 U/L (ref 38–126)
Anion gap: 8 (ref 5–15)
BUN: 19 mg/dL (ref 8–23)
CO2: 28 mmol/L (ref 22–32)
Calcium: 9.2 mg/dL (ref 8.9–10.3)
Chloride: 103 mmol/L (ref 98–111)
Creatinine: 1 mg/dL (ref 0.44–1.00)
GFR, Est AFR Am: >60 mL/min (ref >60)
GFR, Estimated: 56 mL/min — ABNORMAL LOW (ref >60)
Glucose, Bld: 92 mg/dL (ref 70–99)
Potassium: 4.1 mmol/L (ref 3.5–5.1)
Sodium: 139 mmol/L (ref 135–145)
Total Bilirubin: 0.8 mg/dL (ref 0.3–1.2)
Total Protein: 7 g/dL (ref 6.5–8.1)

## 2018-11-19 LAB — CBC WITH DIFFERENTIAL (CANCER CENTER ONLY)
Abs Immature Granulocytes: 0.13 10*3/uL — ABNORMAL HIGH (ref 0.00–0.07)
Basophils Absolute: 0.1 10*3/uL (ref 0.0–0.1)
Basophils Relative: 1 %
Eosinophils Absolute: 0 10*3/uL (ref 0.0–0.5)
Eosinophils Relative: 0 %
HCT: 43.2 % (ref 36.0–46.0)
Hemoglobin: 14.5 g/dL (ref 12.0–15.0)
Immature Granulocytes: 1 %
Lymphocytes Relative: 22 %
Lymphs Abs: 2 10*3/uL (ref 0.7–4.0)
MCH: 30.3 pg (ref 26.0–34.0)
MCHC: 33.6 g/dL (ref 30.0–36.0)
MCV: 90.2 fL (ref 80.0–100.0)
Monocytes Absolute: 0.6 10*3/uL (ref 0.1–1.0)
Monocytes Relative: 7 %
Neutro Abs: 6.4 10*3/uL (ref 1.7–7.7)
Neutrophils Relative %: 69 %
Platelet Count: 324 10*3/uL (ref 150–400)
RBC: 4.79 MIL/uL (ref 3.87–5.11)
RDW: 12.8 % (ref 11.5–15.5)
WBC Count: 9.2 10*3/uL (ref 4.0–10.5)
nRBC: 0 % (ref 0.0–0.2)

## 2018-11-19 LAB — MAGNESIUM: Magnesium: 2.2 mg/dL (ref 1.7–2.4)

## 2018-11-20 ENCOUNTER — Other Ambulatory Visit: Payer: Self-pay | Admitting: Physician Assistant

## 2018-11-20 ENCOUNTER — Inpatient Hospital Stay: Payer: BC Managed Care – PPO | Attending: Internal Medicine

## 2018-11-20 ENCOUNTER — Other Ambulatory Visit: Payer: Self-pay

## 2018-11-20 ENCOUNTER — Telehealth: Payer: Self-pay | Admitting: *Deleted

## 2018-11-20 VITALS — BP 139/89 | HR 94 | Temp 97.6°F

## 2018-11-20 DIAGNOSIS — Z5111 Encounter for antineoplastic chemotherapy: Secondary | ICD-10-CM | POA: Insufficient documentation

## 2018-11-20 DIAGNOSIS — C3431 Malignant neoplasm of lower lobe, right bronchus or lung: Secondary | ICD-10-CM | POA: Insufficient documentation

## 2018-11-20 DIAGNOSIS — Z902 Acquired absence of lung [part of]: Secondary | ICD-10-CM | POA: Insufficient documentation

## 2018-11-20 DIAGNOSIS — J90 Pleural effusion, not elsewhere classified: Secondary | ICD-10-CM | POA: Diagnosis not present

## 2018-11-20 DIAGNOSIS — C3491 Malignant neoplasm of unspecified part of right bronchus or lung: Secondary | ICD-10-CM

## 2018-11-20 MED ORDER — HEPARIN SOD (PORK) LOCK FLUSH 100 UNIT/ML IV SOLN
500.0000 [IU] | Freq: Once | INTRAVENOUS | Status: DC | PRN
  Filled 2018-11-20: qty 5

## 2018-11-20 MED ORDER — PALONOSETRON HCL INJECTION 0.25 MG/5ML
0.2500 mg | Freq: Once | INTRAVENOUS | Status: AC
  Administered 2018-11-20: 11:00:00 0.25 mg via INTRAVENOUS

## 2018-11-20 MED ORDER — SODIUM CHLORIDE 0.9 % IV SOLN
Freq: Once | INTRAVENOUS | Status: AC
  Administered 2018-11-20: 11:00:00 via INTRAVENOUS
  Filled 2018-11-20: qty 5

## 2018-11-20 MED ORDER — SODIUM CHLORIDE 0.9 % IV SOLN
506.0000 mg/m2 | Freq: Once | INTRAVENOUS | Status: AC
  Administered 2018-11-20: 1100 mg via INTRAVENOUS
  Filled 2018-11-20: qty 40

## 2018-11-20 MED ORDER — PALONOSETRON HCL INJECTION 0.25 MG/5ML
INTRAVENOUS | Status: AC
  Filled 2018-11-20: qty 5

## 2018-11-20 MED ORDER — SODIUM CHLORIDE 0.9 % IV SOLN
Freq: Once | INTRAVENOUS | Status: AC
  Administered 2018-11-20: 08:00:00 via INTRAVENOUS
  Filled 2018-11-20: qty 250

## 2018-11-20 MED ORDER — SODIUM CHLORIDE 0.9 % IV SOLN
75.0000 mg/m2 | Freq: Once | INTRAVENOUS | Status: AC
  Administered 2018-11-20: 13:00:00 163 mg via INTRAVENOUS
  Filled 2018-11-20: qty 163

## 2018-11-20 MED ORDER — POTASSIUM CHLORIDE 2 MEQ/ML IV SOLN
Freq: Once | INTRAVENOUS | Status: AC
  Administered 2018-11-20: 09:00:00 via INTRAVENOUS
  Filled 2018-11-20: qty 10

## 2018-11-20 MED ORDER — SODIUM CHLORIDE 0.9% FLUSH
10.0000 mL | INTRAVENOUS | Status: DC | PRN
  Filled 2018-11-20: qty 10

## 2018-11-20 NOTE — Telephone Encounter (Signed)
Last Visit: 04/02/18 Next Visit: 04/08/19  Okay to refill per Dr. Estanislado Pandy

## 2018-11-20 NOTE — Telephone Encounter (Signed)
Pt voiced concern with Abigail Butts, RN educator about not having received rx for ativan. Call to [facility], verified with pharmacist pt picked up Ativan on 6/10. RN will  Review with pt.

## 2018-11-20 NOTE — Patient Instructions (Signed)
[facility] Discharge Instructions for Patients Receiving Chemotherapy  Today you received the following chemotherapy agents: Alimta, Cisplatin   To help prevent nausea and vomiting after your treatment, we encourage you to take your nausea medication as directed.    If you develop nausea and vomiting that is not controlled by your nausea medication, call the clinic.   BELOW ARE SYMPTOMS THAT SHOULD BE REPORTED IMMEDIATELY:  *FEVER GREATER THAN 100.5 F  *CHILLS WITH OR WITHOUT FEVER  NAUSEA AND VOMITING THAT IS NOT CONTROLLED WITH YOUR NAUSEA MEDICATION  *UNUSUAL SHORTNESS OF BREATH  *UNUSUAL BRUISING OR BLEEDING  TENDERNESS IN MOUTH AND THROAT WITH OR WITHOUT PRESENCE OF ULCERS  *URINARY PROBLEMS  *BOWEL PROBLEMS  UNUSUAL RASH Items with * indicate a potential emergency and should be followed up as soon as possible.  Feel free to call the clinic should you have any questions or concerns. The clinic phone number is [phone].  Please show the [facility] at check-in to the Emergency Department and triage nurse.  Pemetrexed injection What is this medicine? PEMETREXED (PEM e TREX ed) is a chemotherapy drug used to treat lung cancers like non-small cell lung cancer and mesothelioma. It may also be used to treat other cancers. This medicine may be used for other purposes; ask your health care provider or pharmacist if you have questions. COMMON BRAND NAME(S): Alimta What should I tell my health care provider before I take this medicine? They need to know if you have any of these conditions:  infection (especially a virus infection such as chickenpox, cold sores, or herpes)  kidney disease  low blood counts, like low white cell, platelet, or red cell counts  lung or breathing disease, like asthma  radiation therapy  an unusual or allergic reaction to pemetrexed, other medicines, foods, dyes, or preservative  pregnant or trying to get  pregnant  breast-feeding How should I use this medicine? This drug is given as an infusion into a vein. It is administered in a hospital or clinic by a specially trained health care professional. Talk to your pediatrician regarding the use of this medicine in children. Special care may be needed. Overdosage: If you think you have taken too much of this medicine contact a poison control center or emergency room at once. NOTE: This medicine is only for you. Do not share this medicine with others. What if I miss a dose? It is important not to miss your dose. Call your doctor or health care professional if you are unable to keep an appointment. What may interact with this medicine? This medicine may interact with the following medications:  Ibuprofen This list may not describe all possible interactions. Give your health care provider a list of all the medicines, herbs, non-prescription drugs, or dietary supplements you use. Also tell them if you smoke, drink alcohol, or use illegal drugs. Some items may interact with your medicine. What should I watch for while using this medicine? Visit your doctor for checks on your progress. This drug may make you feel generally unwell. This is not uncommon, as chemotherapy can affect healthy cells as well as cancer cells. Report any side effects. Continue your course of treatment even though you feel ill unless your doctor tells you to stop. In some cases, you may be given additional medicines to help with side effects. Follow all directions for their use. Call your doctor or health care professional for advice if you get a fever, chills or sore throat,  or other symptoms of a cold or flu. Do not treat yourself. This drug decreases your body's ability to fight infections. Try to avoid being around people who are sick. This medicine may increase your risk to bruise or bleed. Call your doctor or health care professional if you notice any unusual bleeding. Be careful  brushing and flossing your teeth or using a toothpick because you may get an infection or bleed more easily. If you have any dental work done, tell your dentist you are receiving this medicine. Avoid taking products that contain aspirin, acetaminophen, ibuprofen, naproxen, or ketoprofen unless instructed by your doctor. These medicines may hide a fever. Call your doctor or health care professional if you get diarrhea or mouth sores. Do not treat yourself. To protect your kidneys, drink water or other fluids as directed while you are taking this medicine. Do not become pregnant while taking this medicine or for 6 months after stopping it. Women should inform their doctor if they wish to become pregnant or think they might be pregnant. Men should not father a child while taking this medicine and for 3 months after stopping it. This may interfere with the ability to father a child. You should talk to your doctor or health care professional if you are concerned about your fertility. There is a potential for serious side effects to an unborn child. Talk to your health care professional or pharmacist for more information. Do not breast-feed an infant while taking this medicine or for 1 week after stopping it. What side effects may I notice from receiving this medicine? Side effects that you should report to your doctor or health care professional as soon as possible:  allergic reactions like skin rash, itching or hives, swelling of the face, lips, or tongue  breathing problems  redness, blistering, peeling or loosening of the skin, including inside the mouth  signs and symptoms of bleeding such as bloody or black, tarry stools; red or dark-brown urine; spitting up blood or brown material that looks like coffee grounds; red spots on the skin; unusual bruising or bleeding from the eye, gums, or nose  signs and symptoms of infection like fever or chills; cough; sore throat; pain or trouble passing  urine  signs and symptoms of kidney injury like trouble passing urine or change in the amount of urine  signs and symptoms of liver injury like dark yellow or brown urine; general ill feeling or flu-like symptoms; light-colored stools; loss of appetite; nausea; right upper belly pain; unusually weak or tired; yellowing of the eyes or skin Side effects that usually do not require medical attention (report to your doctor or health care professional if they continue or are bothersome):  constipation  mouth sores  nausea, vomiting  unusually weak or tired This list may not describe all possible side effects. Call your doctor for medical advice about side effects. You may report side effects to FDA at 1-800-FDA-1088. Where should I keep my medicine? This drug is given in a hospital or clinic and will not be stored at home. NOTE: This sheet is a summary. It may not cover all possible information. If you have questions about this medicine, talk to your doctor, pharmacist, or health care provider.  2020 Elsevier/Gold Standard (2017-06-27 16:11:33)  Cisplatin injection What is this medicine? CISPLATIN (SIS pla tin) is a chemotherapy drug. It targets fast dividing cells, like cancer cells, and causes these cells to die. This medicine is used to treat many types of cancer like  bladder, ovarian, and testicular cancers. This medicine may be used for other purposes; ask your health care provider or pharmacist if you have questions. COMMON BRAND NAME(S): Platinol, Platinol -AQ What should I tell my health care provider before I take this medicine? They need to know if you have any of these conditions:  blood disorders  hearing problems  kidney disease  recent or ongoing radiation therapy  an unusual or allergic reaction to cisplatin, carboplatin, other chemotherapy, other medicines, foods, dyes, or preservatives  pregnant or trying to get pregnant  breast-feeding How should I use this  medicine? This drug is given as an infusion into a vein. It is administered in a hospital or clinic by a specially trained health care professional. Talk to your pediatrician regarding the use of this medicine in children. Special care may be needed. Overdosage: If you think you have taken too much of this medicine contact a poison control center or emergency room at once. NOTE: This medicine is only for you. Do not share this medicine with others. What if I miss a dose? It is important not to miss a dose. Call your doctor or health care professional if you are unable to keep an appointment. What may interact with this medicine?  dofetilide  foscarnet  medicines for seizures  medicines to increase blood counts like filgrastim, pegfilgrastim, sargramostim  probenecid  pyridoxine used with altretamine  rituximab  some antibiotics like amikacin, gentamicin, neomycin, polymyxin B, streptomycin, tobramycin  sulfinpyrazone  vaccines  zalcitabine Talk to your doctor or health care professional before taking any of these medicines:  acetaminophen  aspirin  ibuprofen  ketoprofen  naproxen This list may not describe all possible interactions. Give your health care provider a list of all the medicines, herbs, non-prescription drugs, or dietary supplements you use. Also tell them if you smoke, drink alcohol, or use illegal drugs. Some items may interact with your medicine. What should I watch for while using this medicine? Your condition will be monitored carefully while you are receiving this medicine. You will need important blood work done while you are taking this medicine. This drug may make you feel generally unwell. This is not uncommon, as chemotherapy can affect healthy cells as well as cancer cells. Report any side effects. Continue your course of treatment even though you feel ill unless your doctor tells you to stop. In some cases, you may be given additional medicines to  help with side effects. Follow all directions for their use. Call your doctor or health care professional for advice if you get a fever, chills or sore throat, or other symptoms of a cold or flu. Do not treat yourself. This drug decreases your body's ability to fight infections. Try to avoid being around people who are sick. This medicine may increase your risk to bruise or bleed. Call your doctor or health care professional if you notice any unusual bleeding. Be careful brushing and flossing your teeth or using a toothpick because you may get an infection or bleed more easily. If you have any dental work done, tell your dentist you are receiving this medicine. Avoid taking products that contain aspirin, acetaminophen, ibuprofen, naproxen, or ketoprofen unless instructed by your doctor. These medicines may hide a fever. Do not become pregnant while taking this medicine. Women should inform their doctor if they wish to become pregnant or think they might be pregnant. There is a potential for serious side effects to an unborn child. Talk to your health care professional  or pharmacist for more information. Do not breast-feed an infant while taking this medicine. Drink fluids as directed while you are taking this medicine. This will help protect your kidneys. Call your doctor or health care professional if you get diarrhea. Do not treat yourself. What side effects may I notice from receiving this medicine? Side effects that you should report to your doctor or health care professional as soon as possible:  allergic reactions like skin rash, itching or hives, swelling of the face, lips, or tongue  signs of infection - fever or chills, cough, sore throat, pain or difficulty passing urine  signs of decreased platelets or bleeding - bruising, pinpoint red spots on the skin, black, tarry stools, nosebleeds  signs of decreased red blood cells - unusually weak or tired, fainting spells,  lightheadedness  breathing problems  changes in hearing  gout pain  low blood counts - This drug may decrease the number of white blood cells, red blood cells and platelets. You may be at increased risk for infections and bleeding.  nausea and vomiting  pain, swelling, redness or irritation at the injection site  pain, tingling, numbness in the hands or feet  problems with balance, movement  trouble passing urine or change in the amount of urine Side effects that usually do not require medical attention (report to your doctor or health care professional if they continue or are bothersome):  changes in vision  loss of appetite  metallic taste in the mouth or changes in taste This list may not describe all possible side effects. Call your doctor for medical advice about side effects. You may report side effects to FDA at 1-800-FDA-1088. Where should I keep my medicine? This drug is given in a hospital or clinic and will not be stored at home. NOTE: This sheet is a summary. It may not cover all possible information. If you have questions about this medicine, talk to your doctor, pharmacist, or health care provider.  2020 Elsevier/Gold Standard (2007-08-13 14:40:54)   Coronavirus (COVID-19) Are you at risk?  Are you at risk for the Coronavirus (COVID-19)?  To be considered HIGH RISK for Coronavirus (COVID-19), you have to meet the following criteria:  . Traveled to [place], [place], [place], [place] or [place]; or in the [place] to [place], [place], [place], or [place]; and have fever, cough, and shortness of breath within the last 2 weeks of travel OR . Been in close contact with a person diagnosed with COVID-19 within the last 2 weeks and have fever, cough, and shortness of breath . IF YOU DO NOT MEET THESE CRITERIA, YOU ARE CONSIDERED LOW RISK FOR COVID-19.  What to do if you are HIGH RISK for COVID-19?  Marland Kitchen If you are having a medical emergency, call  911. . Seek medical care right away. Before you go to a doctor's office, urgent care or emergency department, call ahead and tell them about your recent travel, contact with someone diagnosed with COVID-19, and your symptoms. You should receive instructions from your physician's office regarding next steps of care.  . When you arrive at healthcare provider, tell the healthcare staff immediately you have returned from visiting [place], [place], [place], [place] or [place]; or traveled in the [place] to [place], [place], [place], or [place]; in the last two weeks or you have been in close contact with a person diagnosed with COVID-19 in the last 2 weeks.   . Tell the health care staff about  your symptoms: fever, cough and shortness of breath. . After you have been seen by a medical provider, you will be either: o Tested for (COVID-19) and discharged home on quarantine except to seek medical care if symptoms worsen, and asked to  - Stay home and avoid contact with others until you get your results (4-5 days)  - Avoid travel on public transportation if possible (such as bus, train, or airplane) or o Sent to the Emergency Department by EMS for evaluation, COVID-19 testing, and possible admission depending on your condition and test results.  What to do if you are LOW RISK for COVID-19?  Reduce your risk of any infection by using the same precautions used for avoiding the common cold or flu:  Marland Kitchen Wash your hands often with soap and warm water for at least 20 seconds.  If soap and water are not readily available, use an alcohol-based hand sanitizer with at least 60% alcohol.  . If coughing or sneezing, cover your mouth and nose by coughing or sneezing into the elbow areas of your shirt or coat, into a tissue or into your sleeve (not your hands). . Avoid shaking hands with others and consider head nods or verbal greetings only. . Avoid touching your eyes, nose, or mouth with unwashed hands.   . Avoid close contact with people who are sick. . Avoid places or events with large numbers of people in one location, like concerts or sporting events. . Carefully consider travel plans you have or are making. . If you are planning any travel outside or inside the [place], visit the CDC's Travelers' Health webpage for the latest health notices. . If you have some symptoms but not all symptoms, continue to monitor at home and seek medical attention if your symptoms worsen. . If you are having a medical emergency, call 911.   [facility] / e-Visit: [email or web address]         [facility]: [facility]: [phone]                   [facility]: [phone]

## 2018-11-20 NOTE — Progress Notes (Signed)
Patient reported burning sensation to left forearm IV site aprox half way through Cisplatin infusion. Infusion was paused and checked for blood return. Blood return noted, and no redness or swelling was observed to IV site. Heat pack applied and encouraged patient to report any further discomfort and infusion was resumed. At follow-up, patient reported that symptoms had improved with heat. Heat was removed for several minutes to assess for redness. Upon return, redness was present and patient stated that the area did not hurt, but that redness was "spreading". No swelling was noted, positive blood return. At this point, about 112mL of drug remained. Flow rate was reduced by half for remainder of infusion, and coldpack was applied for comfort, patient tolerated this well.

## 2018-11-20 NOTE — Telephone Encounter (Signed)
RN discussed with pt. Pt states she does recall picking up rx as this was for the MRI. Th was a 1 time Rx.

## 2018-11-25 ENCOUNTER — Telehealth: Payer: Self-pay | Admitting: *Deleted

## 2018-11-25 NOTE — Telephone Encounter (Signed)
Iron for chemotherapy F/U.  Patient is doing well.  Denies n/v.  Denies any new side effects or symptoms.  Bowel and bladder functioning well.  "I am not constipated but feel dis-comfort and tightness passing bowels.  Reminds me of my 2011 hemorrhoid surgery.  Feels like I am trying to expel rocks.  Bowels move daily, what comes out looks soft, no blood, light in color."  Eating and drinking well.  Instructed to drink 64 oz minimum daily or at least the day before, of and after treatment.   Denies questions or needs at this time.  Encouraged to drink water, walk around the house a few times daily, slow deep breaths exhaling with emptying bowels.  Stool softener to lubricate bowels may make it easier to pass.  Call [phone] Mon -Fri 8:00 am - 4:30 pm or anytime as needed for symptoms, changes or event outside office hours.

## 2018-11-25 NOTE — Telephone Encounter (Signed)
-----   Message from Lillia Corporal, RN sent at 11/20/2018  3:19 PM EDT ----- Regarding: Dr. Julien Nordmann- first time chemo First time chemo of Alimta and Cisplatin.

## 2018-12-02 ENCOUNTER — Telehealth: Payer: Self-pay | Admitting: Medical Oncology

## 2018-12-02 ENCOUNTER — Other Ambulatory Visit: Payer: Self-pay | Admitting: Internal Medicine

## 2018-12-02 ENCOUNTER — Encounter: Payer: Self-pay | Admitting: Internal Medicine

## 2018-12-02 ENCOUNTER — Other Ambulatory Visit: Payer: Self-pay

## 2018-12-02 ENCOUNTER — Inpatient Hospital Stay: Payer: BC Managed Care – PPO

## 2018-12-02 DIAGNOSIS — Z902 Acquired absence of lung [part of]: Secondary | ICD-10-CM | POA: Diagnosis not present

## 2018-12-02 DIAGNOSIS — J90 Pleural effusion, not elsewhere classified: Secondary | ICD-10-CM | POA: Diagnosis not present

## 2018-12-02 DIAGNOSIS — C3491 Malignant neoplasm of unspecified part of right bronchus or lung: Secondary | ICD-10-CM

## 2018-12-02 DIAGNOSIS — C3431 Malignant neoplasm of lower lobe, right bronchus or lung: Secondary | ICD-10-CM | POA: Diagnosis not present

## 2018-12-02 DIAGNOSIS — Z5111 Encounter for antineoplastic chemotherapy: Secondary | ICD-10-CM | POA: Diagnosis not present

## 2018-12-02 LAB — CBC WITH DIFFERENTIAL (CANCER CENTER ONLY)
Abs Immature Granulocytes: 0.01 10*3/uL (ref 0.00–0.07)
Basophils Absolute: 0 10*3/uL (ref 0.0–0.1)
Basophils Relative: 0 %
Eosinophils Absolute: 0.3 10*3/uL (ref 0.0–0.5)
Eosinophils Relative: 7 %
HCT: 35.4 % — ABNORMAL LOW (ref 36.0–46.0)
Hemoglobin: 11.8 g/dL — ABNORMAL LOW (ref 12.0–15.0)
Immature Granulocytes: 0 %
Lymphocytes Relative: 26 %
Lymphs Abs: 1.2 10*3/uL (ref 0.7–4.0)
MCH: 30.4 pg (ref 26.0–34.0)
MCHC: 33.3 g/dL (ref 30.0–36.0)
MCV: 91.2 fL (ref 80.0–100.0)
Monocytes Absolute: 0.4 10*3/uL (ref 0.1–1.0)
Monocytes Relative: 9 %
Neutro Abs: 2.7 10*3/uL (ref 1.7–7.7)
Neutrophils Relative %: 58 %
Platelet Count: 176 10*3/uL (ref 150–400)
RBC: 3.88 MIL/uL (ref 3.87–5.11)
RDW: 13.2 % (ref 11.5–15.5)
WBC Count: 4.6 10*3/uL (ref 4.0–10.5)
nRBC: 0 % (ref 0.0–0.2)

## 2018-12-02 LAB — CMP (CANCER CENTER ONLY)
ALT: 77 U/L — ABNORMAL HIGH (ref 0–44)
AST: 40 U/L (ref 15–41)
Albumin: 3 g/dL — ABNORMAL LOW (ref 3.5–5.0)
Alkaline Phosphatase: 194 U/L — ABNORMAL HIGH (ref 38–126)
Anion gap: 8 (ref 5–15)
BUN: 14 mg/dL (ref 8–23)
CO2: 28 mmol/L (ref 22–32)
Calcium: 9.1 mg/dL (ref 8.9–10.3)
Chloride: 104 mmol/L (ref 98–111)
Creatinine: 0.88 mg/dL (ref 0.44–1.00)
GFR, Est AFR Am: >60 mL/min (ref >60)
GFR, Estimated: >60 mL/min (ref >60)
Glucose, Bld: 114 mg/dL — ABNORMAL HIGH (ref 70–99)
Potassium: 3.1 mmol/L — ABNORMAL LOW (ref 3.5–5.1)
Sodium: 140 mmol/L (ref 135–145)
Total Bilirubin: 1.3 mg/dL — ABNORMAL HIGH (ref 0.3–1.2)
Total Protein: 6.4 g/dL — ABNORMAL LOW (ref 6.5–8.1)

## 2018-12-02 LAB — MAGNESIUM: Magnesium: 1.7 mg/dL (ref 1.7–2.4)

## 2018-12-02 MED ORDER — POTASSIUM CHLORIDE ER 20 MEQ PO TBCR: 20.0000 meq | EXTENDED_RELEASE_TABLET | Freq: Every day | ORAL | 0 refills | Status: DC

## 2018-12-02 NOTE — Telephone Encounter (Signed)
Pt notified to pick up kdur .  IV site -bruising at old IV site. Denies warmth ,redness at site. I instructed pt to apply warm moist compresses 5 times tonight and to call if she has redness,warmth , induration at site.

## 2018-12-02 NOTE — Progress Notes (Signed)
Met with patient to introduce myself as Arboriculturist and to offer available resources.  Discussed one-time $94 [place] and qualifications to assist wit personal expenses while going through treatment.  Gave her my card with information regarding grant on the back if interested in applying and for any financial questions or concerns.

## 2018-12-09 ENCOUNTER — Other Ambulatory Visit: Payer: Self-pay | Admitting: Thoracic Surgery (Cardiothoracic Vascular Surgery)

## 2018-12-09 ENCOUNTER — Inpatient Hospital Stay: Payer: BC Managed Care – PPO

## 2018-12-09 ENCOUNTER — Other Ambulatory Visit: Payer: Self-pay

## 2018-12-09 DIAGNOSIS — J90 Pleural effusion, not elsewhere classified: Secondary | ICD-10-CM | POA: Diagnosis not present

## 2018-12-09 DIAGNOSIS — C3491 Malignant neoplasm of unspecified part of right bronchus or lung: Secondary | ICD-10-CM

## 2018-12-09 DIAGNOSIS — C3431 Malignant neoplasm of lower lobe, right bronchus or lung: Secondary | ICD-10-CM | POA: Diagnosis not present

## 2018-12-09 DIAGNOSIS — Z902 Acquired absence of lung [part of]: Secondary | ICD-10-CM | POA: Diagnosis not present

## 2018-12-09 DIAGNOSIS — Z5111 Encounter for antineoplastic chemotherapy: Secondary | ICD-10-CM | POA: Diagnosis not present

## 2018-12-09 LAB — CBC WITH DIFFERENTIAL (CANCER CENTER ONLY)
Abs Immature Granulocytes: 0.21 10*3/uL — ABNORMAL HIGH (ref 0.00–0.07)
Basophils Absolute: 0.1 10*3/uL (ref 0.0–0.1)
Basophils Relative: 2 %
Eosinophils Absolute: 0.2 10*3/uL (ref 0.0–0.5)
Eosinophils Relative: 3 %
HCT: 37.7 % (ref 36.0–46.0)
Hemoglobin: 12.3 g/dL (ref 12.0–15.0)
Immature Granulocytes: 4 %
Lymphocytes Relative: 49 %
Lymphs Abs: 2.5 10*3/uL (ref 0.7–4.0)
MCH: 30.6 pg (ref 26.0–34.0)
MCHC: 32.6 g/dL (ref 30.0–36.0)
MCV: 93.8 fL (ref 80.0–100.0)
Monocytes Absolute: 0.8 10*3/uL (ref 0.1–1.0)
Monocytes Relative: 15 %
Neutro Abs: 1.4 10*3/uL — ABNORMAL LOW (ref 1.7–7.7)
Neutrophils Relative %: 27 %
Platelet Count: 624 10*3/uL — ABNORMAL HIGH (ref 150–400)
RBC: 4.02 MIL/uL (ref 3.87–5.11)
RDW: 13.9 % (ref 11.5–15.5)
WBC Count: 5.1 10*3/uL (ref 4.0–10.5)
nRBC: 0 % (ref 0.0–0.2)

## 2018-12-09 LAB — CMP (CANCER CENTER ONLY)
ALT: 26 U/L (ref 0–44)
AST: 25 U/L (ref 15–41)
Albumin: 3.2 g/dL — ABNORMAL LOW (ref 3.5–5.0)
Alkaline Phosphatase: 140 U/L — ABNORMAL HIGH (ref 38–126)
Anion gap: 8 (ref 5–15)
BUN: 11 mg/dL (ref 8–23)
CO2: 27 mmol/L (ref 22–32)
Calcium: 9.1 mg/dL (ref 8.9–10.3)
Chloride: 106 mmol/L (ref 98–111)
Creatinine: 0.96 mg/dL (ref 0.44–1.00)
GFR, Est AFR Am: >60 mL/min (ref >60)
GFR, Estimated: 59 mL/min — ABNORMAL LOW (ref >60)
Glucose, Bld: 103 mg/dL — ABNORMAL HIGH (ref 70–99)
Potassium: 4 mmol/L (ref 3.5–5.1)
Sodium: 141 mmol/L (ref 135–145)
Total Bilirubin: 0.3 mg/dL (ref 0.3–1.2)
Total Protein: 6.9 g/dL (ref 6.5–8.1)

## 2018-12-09 LAB — MAGNESIUM: Magnesium: 2 mg/dL (ref 1.7–2.4)

## 2018-12-10 ENCOUNTER — Ambulatory Visit
Admission: RE | Admit: 2018-12-10 | Discharge: 2018-12-10 | Disposition: A | Discharge status: Home / Self Care | Payer: BC Managed Care – PPO | Source: Ambulatory Visit | Attending: Thoracic Surgery (Cardiothoracic Vascular Surgery) | Admitting: Thoracic Surgery (Cardiothoracic Vascular Surgery)

## 2018-12-10 ENCOUNTER — Ambulatory Visit (INDEPENDENT_AMBULATORY_CARE_PROVIDER_SITE_OTHER): Payer: Self-pay | Admitting: Thoracic Surgery (Cardiothoracic Vascular Surgery)

## 2018-12-10 VITALS — BP 120/77 | HR 94 | Temp 97.3°F | Resp 20 | Ht 70.0 in | Wt 210.0 lb

## 2018-12-10 DIAGNOSIS — C3491 Malignant neoplasm of unspecified part of right bronchus or lung: Secondary | ICD-10-CM

## 2018-12-10 DIAGNOSIS — J90 Pleural effusion, not elsewhere classified: Secondary | ICD-10-CM

## 2018-12-10 DIAGNOSIS — Z902 Acquired absence of lung [part of]: Secondary | ICD-10-CM

## 2018-12-10 DIAGNOSIS — R0602 Shortness of breath: Secondary | ICD-10-CM | POA: Diagnosis not present

## 2018-12-10 NOTE — Progress Notes (Signed)
[address]       ,[place] [address]             [phone]       HPI: Stephanie Crawford returns for scheduled follow-up visit  Stephanie Crawford is a 73 year old woman who was found to have lung nodules back in 2018.  She is a lifelong non-smoker.  She was followed with CT scans and over time developed an increase in size of a basilar nodule.  On PET CT there was some mild uptake in that nodule as well as a second sub-solid nodule laterally.  Scans also showed multiple other tiny nodules bilaterally.  I did right VATS and multiple wedge resections on 10/09/2018.  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  A third tiny nodule which was about 3 mm in diameter and on the visceral pleural surface was removed and turned out to be an adenocarcinoma as well.  She did well postoperatively and went home on day 4.  I last saw her on 11/12/2018.  She had a small pleural effusion.  She was treated with prednisone.  She now returns for follow-up.  She is had her first cycle of chemotherapy.  She developed phlebitis in her right arm from that.  Past Medical History:  Diagnosis Date  . Allergic rhinitis   . Allergy   . Anemia    prior to hysterectomy--1997  . GERD (gastroesophageal reflux disease)   . Heart murmur   . Hemorrhoid 2011  . History of shingles 04/2014  . Nodule of lower lobe of right lung     Current Outpatient Medications  Medication Sig Dispense Refill  . aspirin 325 MG EC tablet Take 650 mg by mouth daily as needed for pain.    Marland Kitchen azelastine (OPTIVAR) 0.05 % ophthalmic solution Place 1 drop into both eyes daily as needed (irritation).    . calcium carbonate (OS-CAL) 600 MG TABS tablet Take 1,200 mg by mouth every evening.     . Cholecalciferol (VITAMIN D) 50 MCG (2000 UT) tablet Take 2,000 Units by mouth at bedtime.    Marland Kitchen dexamethasone (DECADRON) 4 MG tablet 4 mg p.o. twice daily the day before, day of and day after chemotherapy every 3 weeks. 40 tablet 0   . diclofenac sodium (VOLTAREN) 1 % GEL Apply 2-4 grams to affected joint up to 4 times daily. 259 g 2  . folic acid (FOLVITE) 1 MG tablet Take 1 tablet (1 mg total) by mouth daily. 30 tablet 4  . Omega-3 1000 MG CAPS Take 1,000 mg by mouth at bedtime.    . prochlorperazine (COMPAZINE) 10 MG tablet Take 1 tablet (10 mg total) by mouth every 6 (six) hours as needed for nausea or vomiting. 30 tablet 0   No current facility-administered medications for this visit.     Physical Exam BP 120/77   Pulse 94   Temp (!) 97.3 F (36.3 C) (Skin)   Resp 20   Ht 5\' 10"  (1.778 m)   Wt 210 lb (95.3 kg)   LMP 05/23/1995   SpO2 97% Comment: RA  BMI 30.49 kg/m  73 year old woman in no acute distress Alert and oriented x3 with no focal deficits Lungs clear with equal breath sounds bilaterally Incision well-healed Firm thrombosed vein right forearm, no erythema  Diagnostic Tests: CHEST - 2 VIEW  COMPARISON:  Chest x-rays dated 11/12/2018 and 10/24/2018  FINDINGS: There has been further decrease in the small right pleural effusion. Improved aeration at  the right lung base. Left lung is clear. No pneumothorax. Heart size and vascularity are normal. No bone abnormality.  IMPRESSION: Further decrease in the small right pleural effusion. Improved aeration at the right lung base.   Electronically Signed   By: Lorriane Shire M.D.   On: 12/10/2018 13:58 I personally reviewed the chest x-ray images and concur with the findings noted above   Impression: Stephanie Crawford is a 28 year old non-smoker who was incidentally noted to have lung nodules on a CT of the abdomen done to evaluate for kidney stones.  Over time those nodules increased in size.  2 nodules had some mild hypermetabolic activity.  I did a right VATS with wedge resection on 10/09/2018.  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  There was a third tiny punctate lesion on the visceral pleural surface that also was an  adenocarcinoma.  She staged as T3, N0, stage IIB.  She is doing well from a surgical standpoint.  She has occasional pain if she moves in a certain direction but is not having to take any pain medication.  Her exercise tolerance is good.  She has started adjuvant chemotherapy with cisplatin and Alimta.  Hopefully that will be effective as she does have multiple other lung nodules on CT.  She had a small pleural effusion.  That was improved on today's chest x-ray.  No further follow-up is necessary for that specifically.  She will get ongoing follow-up for her lung cancer.  Plan: Follow-up with Dr. Julien Nordmann as scheduled I will be happy to see Stephanie Crawford back anytime in the future if I can be of any assistance with her care.  Melrose Nakayama, MD [facility] [phone]

## 2018-12-16 ENCOUNTER — Other Ambulatory Visit: Payer: Self-pay

## 2018-12-16 ENCOUNTER — Inpatient Hospital Stay: Payer: BC Managed Care – PPO

## 2018-12-16 ENCOUNTER — Inpatient Hospital Stay (HOSPITAL_BASED_OUTPATIENT_CLINIC_OR_DEPARTMENT_OTHER): Payer: BC Managed Care – PPO | Admitting: Physician Assistant

## 2018-12-16 ENCOUNTER — Telehealth: Payer: Self-pay | Admitting: Physician Assistant

## 2018-12-16 ENCOUNTER — Encounter: Payer: Self-pay | Admitting: Physician Assistant

## 2018-12-16 VITALS — BP 132/83 | HR 105 | Temp 99.6°F | Resp 18 | Ht 70.0 in | Wt 211.2 lb

## 2018-12-16 VITALS — HR 88

## 2018-12-16 DIAGNOSIS — C3431 Malignant neoplasm of lower lobe, right bronchus or lung: Secondary | ICD-10-CM | POA: Diagnosis not present

## 2018-12-16 DIAGNOSIS — C3491 Malignant neoplasm of unspecified part of right bronchus or lung: Secondary | ICD-10-CM

## 2018-12-16 DIAGNOSIS — Z902 Acquired absence of lung [part of]: Secondary | ICD-10-CM

## 2018-12-16 DIAGNOSIS — Z5111 Encounter for antineoplastic chemotherapy: Secondary | ICD-10-CM

## 2018-12-16 DIAGNOSIS — J90 Pleural effusion, not elsewhere classified: Secondary | ICD-10-CM | POA: Diagnosis not present

## 2018-12-16 LAB — CBC WITH DIFFERENTIAL (CANCER CENTER ONLY)
Abs Immature Granulocytes: 0.13 10*3/uL — ABNORMAL HIGH (ref 0.00–0.07)
Basophils Absolute: 0.1 10*3/uL (ref 0.0–0.1)
Basophils Relative: 1 %
Eosinophils Absolute: 0 10*3/uL (ref 0.0–0.5)
Eosinophils Relative: 0 %
HCT: 39.6 % (ref 36.0–46.0)
Hemoglobin: 13.3 g/dL (ref 12.0–15.0)
Immature Granulocytes: 1 %
Lymphocytes Relative: 26 %
Lymphs Abs: 3 10*3/uL (ref 0.7–4.0)
MCH: 30.6 pg (ref 26.0–34.0)
MCHC: 33.6 g/dL (ref 30.0–36.0)
MCV: 91 fL (ref 80.0–100.0)
Monocytes Absolute: 0.5 10*3/uL (ref 0.1–1.0)
Monocytes Relative: 4 %
Neutro Abs: 8.1 10*3/uL — ABNORMAL HIGH (ref 1.7–7.7)
Neutrophils Relative %: 68 %
Platelet Count: 479 10*3/uL — ABNORMAL HIGH (ref 150–400)
RBC: 4.35 MIL/uL (ref 3.87–5.11)
RDW: 13.6 % (ref 11.5–15.5)
WBC Count: 11.8 10*3/uL — ABNORMAL HIGH (ref 4.0–10.5)
nRBC: 0 % (ref 0.0–0.2)

## 2018-12-16 LAB — CMP (CANCER CENTER ONLY)
ALT: 20 U/L (ref 0–44)
AST: 23 U/L (ref 15–41)
Albumin: 3.7 g/dL (ref 3.5–5.0)
Alkaline Phosphatase: 103 U/L (ref 38–126)
Anion gap: 11 (ref 5–15)
BUN: 16 mg/dL (ref 8–23)
CO2: 22 mmol/L (ref 22–32)
Calcium: 10 mg/dL (ref 8.9–10.3)
Chloride: 105 mmol/L (ref 98–111)
Creatinine: 0.95 mg/dL (ref 0.44–1.00)
GFR, Est AFR Am: >60 mL/min (ref >60)
GFR, Estimated: 60 mL/min — ABNORMAL LOW (ref >60)
Glucose, Bld: 166 mg/dL — ABNORMAL HIGH (ref 70–99)
Potassium: 3.7 mmol/L (ref 3.5–5.1)
Sodium: 138 mmol/L (ref 135–145)
Total Bilirubin: 0.4 mg/dL (ref 0.3–1.2)
Total Protein: 7.4 g/dL (ref 6.5–8.1)

## 2018-12-16 LAB — MAGNESIUM: Magnesium: 1.9 mg/dL (ref 1.7–2.4)

## 2018-12-16 MED ORDER — SODIUM CHLORIDE 0.9 % IV SOLN
1100.0000 mg | Freq: Once | INTRAVENOUS | Status: AC
  Administered 2018-12-16: 1100 mg via INTRAVENOUS
  Filled 2018-12-16: qty 40

## 2018-12-16 MED ORDER — SODIUM CHLORIDE 0.9 % IV SOLN
75.0000 mg/m2 | Freq: Once | INTRAVENOUS | Status: AC
  Administered 2018-12-16: 163 mg via INTRAVENOUS
  Filled 2018-12-16: qty 163

## 2018-12-16 MED ORDER — PALONOSETRON HCL INJECTION 0.25 MG/5ML
INTRAVENOUS | Status: AC
  Filled 2018-12-16: qty 5

## 2018-12-16 MED ORDER — POTASSIUM CHLORIDE 2 MEQ/ML IV SOLN
Freq: Once | INTRAVENOUS | Status: AC
  Administered 2018-12-16: 11:00:00 via INTRAVENOUS
  Filled 2018-12-16: qty 10

## 2018-12-16 MED ORDER — PALONOSETRON HCL INJECTION 0.25 MG/5ML
0.2500 mg | Freq: Once | INTRAVENOUS | Status: AC
  Administered 2018-12-16: 0.25 mg via INTRAVENOUS

## 2018-12-16 MED ORDER — SODIUM CHLORIDE 0.9 % IV SOLN
Freq: Once | INTRAVENOUS | Status: AC
  Administered 2018-12-16: 10:00:00 via INTRAVENOUS
  Filled 2018-12-16: qty 250

## 2018-12-16 MED ORDER — SODIUM CHLORIDE 0.9 % IV SOLN
Freq: Once | INTRAVENOUS | Status: AC
  Administered 2018-12-16: 13:00:00 via INTRAVENOUS
  Filled 2018-12-16: qty 5

## 2018-12-16 NOTE — Patient Instructions (Signed)
[facility] Discharge Instructions for Patients Receiving Chemotherapy  Today you received the following chemotherapy agents: Alimta, Cisplatin   To help prevent nausea and vomiting after your treatment, we encourage you to take your nausea medication as directed.    If you develop nausea and vomiting that is not controlled by your nausea medication, call the clinic.   BELOW ARE SYMPTOMS THAT SHOULD BE REPORTED IMMEDIATELY:  *FEVER GREATER THAN 100.5 F  *CHILLS WITH OR WITHOUT FEVER  NAUSEA AND VOMITING THAT IS NOT CONTROLLED WITH YOUR NAUSEA MEDICATION  *UNUSUAL SHORTNESS OF BREATH  *UNUSUAL BRUISING OR BLEEDING  TENDERNESS IN MOUTH AND THROAT WITH OR WITHOUT PRESENCE OF ULCERS  *URINARY PROBLEMS  *BOWEL PROBLEMS  UNUSUAL RASH Items with * indicate a potential emergency and should be followed up as soon as possible.  Feel free to call the clinic should you have any questions or concerns. The clinic phone number is [phone].  Please show the [facility] at check-in to the Emergency Department and triage nurse.  Coronavirus (COVID-19) Are you at risk?  Are you at risk for the Coronavirus (COVID-19)?  To be considered HIGH RISK for Coronavirus (COVID-19), you have to meet the following criteria:  . Traveled to [place], [place], [place], [place] or [place]; or in the [place] to [place], [place], [place], or [place]; and have fever, cough, and shortness of breath within the last 2 weeks of travel OR . Been in close contact with a person diagnosed with COVID-19 within the last 2 weeks and have fever, cough, and shortness of breath . IF YOU DO NOT MEET THESE CRITERIA, YOU ARE CONSIDERED LOW RISK FOR COVID-19.  What to do if you are HIGH RISK for COVID-19?  Marland Kitchen If you are having a medical emergency, call 911. . Seek medical care right away. Before you go to a doctor's office, urgent care or emergency department, call ahead and tell them about  your recent travel, contact with someone diagnosed with COVID-19, and your symptoms. You should receive instructions from your physician's office regarding next steps of care.  . When you arrive at healthcare provider, tell the healthcare staff immediately you have returned from visiting [place], [place], [place], [place] or [place]; or traveled in the [place] to [place], [place], [place], or [place]; in the last two weeks or you have been in close contact with a person diagnosed with COVID-19 in the last 2 weeks.   . Tell the health care staff about your symptoms: fever, cough and shortness of breath. . After you have been seen by a medical provider, you will be either: o Tested for (COVID-19) and discharged home on quarantine except to seek medical care if symptoms worsen, and asked to  - Stay home and avoid contact with others until you get your results (4-5 days)  - Avoid travel on public transportation if possible (such as bus, train, or airplane) or o Sent to the Emergency Department by EMS for evaluation, COVID-19 testing, and possible admission depending on your condition and test results.  What to do if you are LOW RISK for COVID-19?  Reduce your risk of any infection by using the same precautions used for avoiding the common cold or flu:  Marland Kitchen Wash your hands often with soap and warm water for at least 20 seconds.  If soap and water are not readily available, use an alcohol-based hand sanitizer with at least 60% alcohol.  . If  coughing or sneezing, cover your mouth and nose by coughing or sneezing into the elbow areas of your shirt or coat, into a tissue or into your sleeve (not your hands). . Avoid shaking hands with others and consider head nods or verbal greetings only. . Avoid touching your eyes, nose, or mouth with unwashed hands.  . Avoid close contact with people who are sick. . Avoid places or events with large numbers of people in one location, like concerts or sporting  events. . Carefully consider travel plans you have or are making. . If you are planning any travel outside or inside the [place], visit the CDC's Travelers' Health webpage for the latest health notices. . If you have some symptoms but not all symptoms, continue to monitor at home and seek medical attention if your symptoms worsen. . If you are having a medical emergency, call 911.   [place] / e-Visit: [email or web address]         [facility]: [facility]: [phone]                   [facility]: [phone]

## 2018-12-16 NOTE — Patient Instructions (Signed)
Thrombophlebitis Thrombophlebitis is a condition in which a blood clot forms in a vein. This can happen in your arms or legs, or in the area between your neck and groin (torso). When this condition happens in a vein that is close to the surface of the body (superficial thrombophlebitis), it is usually not serious.However, when the condition happens in a vein that is deep inside the body (deep vein thrombosis, DVT), it can cause serious problems. What are the causes? This condition may be caused by:  Damage to a vein.  Inflammation of the veins.  A condition that causes blood to clot more easily.  Reduced blood flow through the veins. What increases the risk? The following factors may make you more likely to develop this condition:  Having a condition that makes blood thicker or more likely to clot.  Having an infection.  Having major surgery.  Experiencing a traumatic injury or a broken bone.  Having a catheter in a vein (central line).  Having a condition in which valves in the veins do not work properly, causing blood to collect (pool) in the veins (chronic venous insufficiency).  An inactive (sedentary) lifestyle.  Pregnancy or having recently given birth.  Cancer.  Older age, especially being 60 or older.  Obesity.  Smoking.  Taking medicines that contain estrogen, such as birth control pills.  Having varicose veins.  Using drugs that are injected into the veins (intravenous, IV). What are the signs or symptoms? The main symptoms of this condition are:  Swelling and pain in an arm or leg. If the affected vein is in the leg, you may feel pain while standing or walking.  Warmth or redness in an arm or leg. Other symptoms include:  Low-grade fever.  Muscle aches.  A bulging vein (venous distension). In some cases, there are no symptoms. How is this diagnosed? This condition may be diagnosed based on:  Your symptoms and medical history.  A physical exam.   Tests, such as: ? Blood tests. ? A test that uses sound waves to make images (ultrasound). How is this treated? Treatment depends on how severe the condition is and which area of the body is affected. Treatment may include:  Applying a warm compress or heating pad to affected areas.  Wearing compression stockings to help prevent blood clots and reduce swelling in your legs.  Raising (elevating) the affected arm or leg above the level of your heart.  Medicines, such as: ? Anti-inflammatory medicines, such as ibuprofen. ? Blood thinners (anticoagulants), such as heparin. ? Antibiotic medicine, if you have an infection.  Removing an IV that may be causing the problem. In rare cases, surgery may be needed to:  Remove a damaged section of a vein.  Place a filter in a large vein to catch blood clots before they reach the lungs. Follow these instructions at home: Medicines  Take over-the-counter and prescription medicines only as told by your health care provider.  If you were prescribed an antibiotic, take it as told by your health care provider. Do not stop using the antibiotic even if you feel better. Managing pain, stiffness, and swelling   If directed, put heat on the affected area as often as told by your health care provider. Use the heat source that your health care provider recommends, such as a moist heat pack or a heating pad. ? Place a towel between your skin and the heat source. ? Leave the heat on for 20-30 minutes. ? Remove the heat   if your skin turns bright red. This is especially important if you are not able to feel pain, heat, or cold. You may have a greater risk of getting burned.  Elevate the affected area above the level of your heart while you are sitting or lying down. Activity  Return to your normal activities as told by your health care provider. Ask your health care provider what activities are safe for you.  Avoid sitting or lying down for long  periods. If possible, stand up and walk around regularly. If you are taking blood thinners:  Take your medicine exactly as told, at the same time every day.  Avoid activities that could cause injury or bruising, and follow instructions about how to prevent falls.  Wear a medical alert bracelet or carry a card that lists what medicines you take. General instructions  Drink enough fluid to keep your urine pale yellow.  Wear compression stockings as told by your health care provider.  Do not use any products that contain nicotine or tobacco, such as cigarettes and e-cigarettes. If you need help quitting, ask your health care provider.  Keep all follow-up visits as told by your health care provider. This is important. Contact a health care provider if:  You miss a dose of your blood thinner, if applicable.  Your symptoms do not improve.  You have unusual bruising.  You have nausea, vomiting, or diarrhea that lasts for more than one day. Get help right away if:  You have any of these problems: ? New or worse pain, swelling, or redness in an arm or leg. ? Numbness or tingling in an arm or leg. ? Shortness of breath. ? Chest pain. ? Severe pain in your abdomen. ? Fast breathing. ? A fast or irregular heartbeat. ? Blood in your vomit, stool, or urine. ? A severe headache or confusion. ? A cut that does not stop bleeding.  You feel light-headed or dizzy.  You cough up blood.  You have a serious fall or accident, or you hit your head. These symptoms may represent a serious problem that is an emergency. Do not wait to see if the symptoms will go away. Get medical help right away. Call your local emergency services (911 in the [place] Do not drive yourself to the hospital. Summary  Thrombophlebitis is a condition in which a blood clot forms in a vein. This can happen in a vein close to the surface of the body or a vein deep inside the body.  This condition can cause serious  problems when it happens in a vein deep inside the body (deep vein thrombosis, DVT).  The main symptom of this condition is swelling and pain around the affected vein.  Treatment may include warm compresses, anti-inflammatory medicines, or blood thinners. This information is not intended to replace advice given to you by your health care provider. Make sure you discuss any questions you have with your health care provider. Document Released: 11/01/2016 Document Revised: 08/28/2018 Document Reviewed: 11/01/2016 Elsevier Patient Education  2020 Elsevier Inc.  

## 2018-12-16 NOTE — Telephone Encounter (Signed)
Scheduled per los. Patient declined printout  

## 2018-12-16 NOTE — Progress Notes (Signed)
[facility] OFFICE PROGRESS NOTE  Pa, [facility] [address] [place] [address]  DIAGNOSIS: Stage IIB(T3, N0, M0) non-small cell lung cancer, adenocarcinoma diagnosed in May 2020.  Biomarker Findings Microsatellite status - Cannot Be Determined Tumor Mutational Burden - Cannot Be Determined Genomic Findings For a complete list of the genes assayed, please refer to the Appendix. KRAS G12V TP53 G244V 7 Disease relevant genes with no reportable alterations: ALK, BRAF, EGFR, ERBB2, MET, RET, ROS1   PDL 1 expression: negative  PRIOR THERAPY: Status post wedge resection x3 of the right lower lobe with lymph node sampling under the care of Dr. Roxan Hockey on 10/09/2018.  CURRENT THERAPY: Adjuvant systemic chemotherapy with cisplatin 75 mg/M2 and Alimta 500 mg/M2 every 3 weeks.  First dose November 22, 2018. Status post 1 cycle.   INTERVAL HISTORY: Stephanie Crawford 73 y.o. female returns to the clinic for a follow-up visit. The patient is feeling well today without any concerning complaints.  The patient recently completed her first cycle of adjuvant chemotherapy and she tolerated well without any adverse effects except for discoloration and bumps along her IV site on her right forearm. This has improved but it has not resolved completely.  She denies any fever, chills, night sweats, or weight loss.  She denies any chest pain, shortness of breath, cough, or hemoptysis.  She denies any nausea, vomiting, diarrhea, or constipation.  She denies any headache or visual changes.  She is here today for evaluation before starting cycle #2.   MEDICAL HISTORY: Past Medical History:  Diagnosis Date  . Allergic rhinitis   . Allergy   . Anemia    prior to hysterectomy--1997  . GERD (gastroesophageal reflux disease)   . Heart murmur   . Hemorrhoid 2011  . History of shingles 04/2014  . Nodule of lower lobe of right lung     ALLERGIES:  is  allergic to adhesive [tape].  MEDICATIONS:  Current Outpatient Medications  Medication Sig Dispense Refill  . aspirin 325 MG EC tablet Take 650 mg by mouth daily as needed for pain.    Marland Kitchen azelastine (OPTIVAR) 0.05 % ophthalmic solution Place 1 drop into both eyes daily as needed (irritation).    . calcium carbonate (OS-CAL) 600 MG TABS tablet Take 1,200 mg by mouth every evening.     . Cholecalciferol (VITAMIN D) 50 MCG (2000 UT) tablet Take 2,000 Units by mouth at bedtime.    Marland Kitchen dexamethasone (DECADRON) 4 MG tablet 4 mg p.o. twice daily the day before, day of and day after chemotherapy every 3 weeks. 40 tablet 0  . diclofenac sodium (VOLTAREN) 1 % GEL Apply 2-4 grams to affected joint up to 4 times daily. 741 g 2  . folic acid (FOLVITE) 1 MG tablet Take 1 tablet (1 mg total) by mouth daily. 30 tablet 4  . Omega-3 1000 MG CAPS Take 1,000 mg by mouth at bedtime.    . prochlorperazine (COMPAZINE) 10 MG tablet Take 1 tablet (10 mg total) by mouth every 6 (six) hours as needed for nausea or vomiting. 30 tablet 0   No current facility-administered medications for this visit.     SURGICAL HISTORY:  Past Surgical History:  Procedure Laterality Date  . ABDOMINAL HYSTERECTOMY  1997  . APPENDECTOMY  1960  . HEMORRHOID SURGERY  9/11   8/27 had a follow up procedure.    . IR THORACENTESIS ASP PLEURAL SPACE W/IMG GUIDE  10/24/2018  . LIPOMA  EXCISION  1975   neck, left breast and righ jaw.  Marland Kitchen VIDEO ASSISTED THORACOSCOPY (VATS)/WEDGE RESECTION Right 10/09/2018   Procedure: VIDEO ASSISTED THORACOSCOPY (VATS)/WEDGE RESECTION;  Surgeon: Melrose Nakayama, MD;  Location: [facility] OR;  Service: Thoracic;  Laterality: Right;    REVIEW OF SYSTEMS:   Review of Systems  Constitutional: Negative for appetite change, chills, fatigue, fever and unexpected weight change.  HENT: Negative for mouth sores, nosebleeds, sore throat and trouble swallowing.   Eyes: Negative for eye problems and icterus.  Respiratory:  Negative for cough, hemoptysis, shortness of breath and wheezing.   Cardiovascular: Negative for chest pain and leg swelling.  Gastrointestinal: Negative for abdominal pain, constipation, diarrhea, nausea and vomiting.  Genitourinary: Negative for bladder incontinence, difficulty urinating, dysuria, frequency and hematuria.   Musculoskeletal: Negative for back pain, gait problem, neck pain and neck stiffness.  Skin: Positive for thrombophlebitis on R forearm.  Neurological: Negative for dizziness, extremity weakness, gait problem, headaches, light-headedness and seizures.  Hematological: Negative for adenopathy. Does not bruise/bleed easily.  Psychiatric/Behavioral: Negative for confusion, depression and sleep disturbance. The patient is not nervous/anxious.     PHYSICAL EXAMINATION:  Blood pressure 132/83, pulse (!) 105, temperature 99.6 F (37.6 C), temperature source Oral, resp. rate 18, height _0  (1.778 m), weight 211 lb 3.2 oz (95.8 kg), last menstrual period 05/23/1995, SpO2 100 %.  ECOG PERFORMANCE STATUS: 1 - Symptomatic but completely ambulatory  Physical Exam  Constitutional: Oriented to person, place, and time and well-developed, well-nourished, and in no distress.  HENT:  Head: Normocephalic and atraumatic.  Mouth/Throat: Oropharynx is clear and moist. No oropharyngeal exudate.  Eyes: Conjunctivae are normal. Right eye exhibits no discharge. Left eye exhibits no discharge. No scleral icterus.  Neck: Normal range of motion. Neck supple.  Cardiovascular: Normal rate, regular rhythm, normal heart sounds and intact distal pulses.   Pulmonary/Chest: Effort normal and breath sounds normal. No respiratory distress. No wheezes. No rales.  Abdominal: Soft. Bowel sounds are normal. Exhibits no distension and no mass. There is no tenderness.  Musculoskeletal: Normal range of motion. Exhibits no edema.  Lymphadenopathy:    No cervical adenopathy.  Neurological: Alert and oriented to  person, place, and time. Exhibits normal muscle tone. Gait normal. Coordination normal.  Skin: Skin discoloration along vein in right forearm and palpable vein. Skin is warm and dry. Not diaphoretic. No pallor.  Psychiatric: Mood, memory and judgment normal.  Vitals reviewed.  LABORATORY DATA: Lab Results  Component Value Date   WBC 11.8 (H) 12/16/2018   HGB 13.3 12/16/2018   HCT 39.6 12/16/2018   MCV 91.0 12/16/2018   PLT 479 (H) 12/16/2018      Chemistry      Component Value Date/Time   NA 138 12/16/2018 0815   K 3.7 12/16/2018 0815   CL 105 12/16/2018 0815   CO2 22 12/16/2018 0815   BUN 16 12/16/2018 0815   CREATININE 0.95 12/16/2018 0815   CREATININE 0.75 07/14/2014 1231      Component Value Date/Time   CALCIUM 10.0 12/16/2018 0815   CALCIUM 10.1 12/28/2009 2231   ALKPHOS 103 12/16/2018 0815   AST 23 12/16/2018 0815   ALT 20 12/16/2018 0815   BILITOT 0.4 12/16/2018 0815       RADIOGRAPHIC STUDIES:  Dg Chest 2 View  Result Date: 12/10/2018 CLINICAL DATA:  Adenocarcinoma of the right lung, stage II. Status post VATS on 10/09/2018. Shortness of breath. Right pleural effusion. EXAM: CHEST - 2 VIEW COMPARISON:  Chest x-rays dated 11/12/2018 and 10/24/2018 FINDINGS: There has been further decrease in the small right pleural effusion. Improved aeration at the right lung base. Left lung is clear. No pneumothorax. Heart size and vascularity are normal. No bone abnormality. IMPRESSION: Further decrease in the small right pleural effusion. Improved aeration at the right lung base. Electronically Signed   By: Lorriane Shire M.D.   On: 12/10/2018 13:58     ASSESSMENT/PLAN:  This is a very pleasant 73 year old African-American female with stage IIb (T3, N0, M0) non-small cell lung cancer, adenocarcinoma.  She has multifocal disease in the right lower lobe.  She is status post a wedge resection x3 of the right lower lobe with lymph node sampling in May 2020.  She has no actionable  mutations and her PDL 1 expression is negative.  The patient is currently undergoing adjuvant platinum based chemotherapy with cisplatin 75 mg/m and Alimta 500 mg/m every 3 weeks.  She is status post 1 cycle of treatment. She has tolerated it without any adverse effects.  The patient was seen with Dr. Julien Nordmann today.  Labs were reviewed with the patient.  We recommend that she proceed with cycle #2 today as scheduled.  We will see her back for follow-up visit in 3 weeks for evaluation before starting cycle #3.  She will continue taking her folic acid 1 mg p.o. daily, Compazine 10 mg p.o. every 6 hours as needed for nausea, as well as Decadron 4 mg p.o. twice daily the day before, day after, and the day of chemotherapy.   For the thrombophlebitis, she was advised to use heating pads to the affected area. She will use anti-inflammatories as needed for management of discomfort. She was advised to contact our office if she develops new or worsening symptoms.   The patient was advised to call immediately if she has any concerning symptoms in the interval. The patient voices understanding of current disease status and treatment options and is in agreement with the current care plan. All questions were answered. The patient knows to call the clinic with any problems, questions or concerns. We can certainly see the patient much sooner if necessary    No orders of the defined types were placed in this encounter.    Rajvi Armentor L Audrena Talaga, PA-C 12/16/18  ADDENDUM: Hematology/Oncology Attending: I had a face-to-face encounter with the patient today.  I recommended her care plan.  This is a very pleasant 73 years old African-American female with a stage IIb non-small cell lung cancer, adenocarcinoma and she is currently undergoing adjuvant systemic chemotherapy with cisplatin and Alimta status post 1 cycle.  She tolerated the first cycle of her treatment well with no concerning adverse effects. I  recommended for the patient to proceed with cycle #2 today as planned. We will see her back for follow-up visit in 3 weeks for evaluation before the next cycle of her treatment. She was advised to call immediately if she has any concerning symptoms in the interval.  Disclaimer: This note was dictated with voice recognition software. Similar sounding words can inadvertently be transcribed and may be missed upon review. Eilleen Kempf, MD 12/16/18

## 2018-12-16 NOTE — Progress Notes (Signed)
Urine output pre-cisplatin 230cc. Dr. Julien Nordmann notified. Received OK to treat.

## 2018-12-20 MED ORDER — DENOSUMAB 60 MG/ML ~~LOC~~ SOSY
PREFILLED_SYRINGE | SUBCUTANEOUS | Status: AC
  Filled 2018-12-20: qty 1

## 2018-12-23 ENCOUNTER — Inpatient Hospital Stay: Payer: BC Managed Care – PPO | Attending: Internal Medicine

## 2018-12-23 ENCOUNTER — Other Ambulatory Visit: Payer: Self-pay

## 2018-12-23 DIAGNOSIS — J90 Pleural effusion, not elsewhere classified: Secondary | ICD-10-CM | POA: Diagnosis not present

## 2018-12-23 DIAGNOSIS — C3431 Malignant neoplasm of lower lobe, right bronchus or lung: Secondary | ICD-10-CM | POA: Diagnosis not present

## 2018-12-23 DIAGNOSIS — Z5111 Encounter for antineoplastic chemotherapy: Secondary | ICD-10-CM | POA: Diagnosis not present

## 2018-12-23 DIAGNOSIS — C3491 Malignant neoplasm of unspecified part of right bronchus or lung: Secondary | ICD-10-CM

## 2018-12-23 LAB — CBC WITH DIFFERENTIAL (CANCER CENTER ONLY)
Abs Immature Granulocytes: 0.02 10*3/uL (ref 0.00–0.07)
Basophils Absolute: 0.1 10*3/uL (ref 0.0–0.1)
Basophils Relative: 2 %
Eosinophils Absolute: 0.1 10*3/uL (ref 0.0–0.5)
Eosinophils Relative: 3 %
HCT: 39 % (ref 36.0–46.0)
Hemoglobin: 13.1 g/dL (ref 12.0–15.0)
Immature Granulocytes: 1 %
Lymphocytes Relative: 54 %
Lymphs Abs: 1.9 10*3/uL (ref 0.7–4.0)
MCH: 30.8 pg (ref 26.0–34.0)
MCHC: 33.6 g/dL (ref 30.0–36.0)
MCV: 91.8 fL (ref 80.0–100.0)
Monocytes Absolute: 0.1 10*3/uL (ref 0.1–1.0)
Monocytes Relative: 3 %
Neutro Abs: 1.3 10*3/uL — ABNORMAL LOW (ref 1.7–7.7)
Neutrophils Relative %: 37 %
Platelet Count: 259 10*3/uL (ref 150–400)
RBC: 4.25 MIL/uL (ref 3.87–5.11)
RDW: 13.2 % (ref 11.5–15.5)
WBC Count: 3.4 10*3/uL — ABNORMAL LOW (ref 4.0–10.5)
nRBC: 0 % (ref 0.0–0.2)

## 2018-12-23 LAB — CMP (CANCER CENTER ONLY)
ALT: 38 U/L (ref 0–44)
AST: 21 U/L (ref 15–41)
Albumin: 3.7 g/dL (ref 3.5–5.0)
Alkaline Phosphatase: 101 U/L (ref 38–126)
Anion gap: 9 (ref 5–15)
BUN: 12 mg/dL (ref 8–23)
CO2: 29 mmol/L (ref 22–32)
Calcium: 9.8 mg/dL (ref 8.9–10.3)
Chloride: 100 mmol/L (ref 98–111)
Creatinine: 1.02 mg/dL — ABNORMAL HIGH (ref 0.44–1.00)
GFR, Est AFR Am: >60 mL/min (ref >60)
GFR, Estimated: 55 mL/min — ABNORMAL LOW (ref >60)
Glucose, Bld: 124 mg/dL — ABNORMAL HIGH (ref 70–99)
Potassium: 3.7 mmol/L (ref 3.5–5.1)
Sodium: 138 mmol/L (ref 135–145)
Total Bilirubin: 1.3 mg/dL — ABNORMAL HIGH (ref 0.3–1.2)
Total Protein: 7 g/dL (ref 6.5–8.1)

## 2018-12-23 LAB — MAGNESIUM: Magnesium: 1.8 mg/dL (ref 1.7–2.4)

## 2018-12-30 ENCOUNTER — Inpatient Hospital Stay: Payer: BC Managed Care – PPO

## 2018-12-30 ENCOUNTER — Other Ambulatory Visit: Payer: Self-pay

## 2018-12-30 DIAGNOSIS — J90 Pleural effusion, not elsewhere classified: Secondary | ICD-10-CM | POA: Diagnosis not present

## 2018-12-30 DIAGNOSIS — C3431 Malignant neoplasm of lower lobe, right bronchus or lung: Secondary | ICD-10-CM | POA: Diagnosis not present

## 2018-12-30 DIAGNOSIS — C3491 Malignant neoplasm of unspecified part of right bronchus or lung: Secondary | ICD-10-CM

## 2018-12-30 DIAGNOSIS — Z5111 Encounter for antineoplastic chemotherapy: Secondary | ICD-10-CM | POA: Diagnosis not present

## 2018-12-30 LAB — CMP (CANCER CENTER ONLY)
ALT: 14 U/L (ref 0–44)
AST: 18 U/L (ref 15–41)
Albumin: 3.7 g/dL (ref 3.5–5.0)
Alkaline Phosphatase: 93 U/L (ref 38–126)
Anion gap: 12 (ref 5–15)
BUN: 14 mg/dL (ref 8–23)
CO2: 24 mmol/L (ref 22–32)
Calcium: 9.8 mg/dL (ref 8.9–10.3)
Chloride: 106 mmol/L (ref 98–111)
Creatinine: 1.1 mg/dL — ABNORMAL HIGH (ref 0.44–1.00)
GFR, Est AFR Am: 58 mL/min — ABNORMAL LOW (ref >60)
GFR, Estimated: 50 mL/min — ABNORMAL LOW (ref >60)
Glucose, Bld: 100 mg/dL — ABNORMAL HIGH (ref 70–99)
Potassium: 3.8 mmol/L (ref 3.5–5.1)
Sodium: 142 mmol/L (ref 135–145)
Total Bilirubin: 0.6 mg/dL (ref 0.3–1.2)
Total Protein: 7 g/dL (ref 6.5–8.1)

## 2018-12-30 LAB — CBC WITH DIFFERENTIAL (CANCER CENTER ONLY)
Abs Immature Granulocytes: 0 10*3/uL (ref 0.00–0.07)
Basophils Absolute: 0 10*3/uL (ref 0.0–0.1)
Basophils Relative: 0 %
Eosinophils Absolute: 0.1 10*3/uL (ref 0.0–0.5)
Eosinophils Relative: 4 %
HCT: 36.1 % (ref 36.0–46.0)
Hemoglobin: 12.3 g/dL (ref 12.0–15.0)
Immature Granulocytes: 0 %
Lymphocytes Relative: 58 %
Lymphs Abs: 2 10*3/uL (ref 0.7–4.0)
MCH: 31.4 pg (ref 26.0–34.0)
MCHC: 34.1 g/dL (ref 30.0–36.0)
MCV: 92.1 fL (ref 80.0–100.0)
Monocytes Absolute: 0.5 10*3/uL (ref 0.1–1.0)
Monocytes Relative: 15 %
Neutro Abs: 0.8 10*3/uL — ABNORMAL LOW (ref 1.7–7.7)
Neutrophils Relative %: 23 %
Platelet Count: 181 10*3/uL (ref 150–400)
RBC: 3.92 MIL/uL (ref 3.87–5.11)
RDW: 13.8 % (ref 11.5–15.5)
WBC Count: 3.5 10*3/uL — ABNORMAL LOW (ref 4.0–10.5)
nRBC: 0 % (ref 0.0–0.2)

## 2018-12-30 LAB — MAGNESIUM: Magnesium: 1.8 mg/dL (ref 1.7–2.4)

## 2019-01-06 ENCOUNTER — Encounter: Payer: Self-pay | Admitting: Physician Assistant

## 2019-01-06 ENCOUNTER — Inpatient Hospital Stay (HOSPITAL_BASED_OUTPATIENT_CLINIC_OR_DEPARTMENT_OTHER): Payer: BC Managed Care – PPO | Admitting: Physician Assistant

## 2019-01-06 ENCOUNTER — Inpatient Hospital Stay: Payer: BC Managed Care – PPO

## 2019-01-06 ENCOUNTER — Other Ambulatory Visit: Payer: Self-pay

## 2019-01-06 ENCOUNTER — Inpatient Hospital Stay (HOSPITAL_BASED_OUTPATIENT_CLINIC_OR_DEPARTMENT_OTHER): Payer: BC Managed Care – PPO | Admitting: Medical

## 2019-01-06 VITALS — HR 90

## 2019-01-06 VITALS — BP 150/87 | HR 107 | Temp 97.8°F | Resp 20 | Ht 70.0 in | Wt 214.5 lb

## 2019-01-06 DIAGNOSIS — C3491 Malignant neoplasm of unspecified part of right bronchus or lung: Secondary | ICD-10-CM

## 2019-01-06 DIAGNOSIS — Z5111 Encounter for antineoplastic chemotherapy: Secondary | ICD-10-CM

## 2019-01-06 DIAGNOSIS — J90 Pleural effusion, not elsewhere classified: Secondary | ICD-10-CM | POA: Diagnosis not present

## 2019-01-06 DIAGNOSIS — T80818A Extravasation of other vesicant agent, initial encounter: Secondary | ICD-10-CM

## 2019-01-06 DIAGNOSIS — C3431 Malignant neoplasm of lower lobe, right bronchus or lung: Secondary | ICD-10-CM | POA: Diagnosis not present

## 2019-01-06 LAB — CBC WITH DIFFERENTIAL (CANCER CENTER ONLY)
Abs Immature Granulocytes: 0.05 10*3/uL (ref 0.00–0.07)
Basophils Absolute: 0 10*3/uL (ref 0.0–0.1)
Basophils Relative: 0 %
Eosinophils Absolute: 0.1 10*3/uL (ref 0.0–0.5)
Eosinophils Relative: 2 %
HCT: 38 % (ref 36.0–46.0)
Hemoglobin: 12.9 g/dL (ref 12.0–15.0)
Immature Granulocytes: 1 %
Lymphocytes Relative: 31 %
Lymphs Abs: 1.9 10*3/uL (ref 0.7–4.0)
MCH: 30.9 pg (ref 26.0–34.0)
MCHC: 33.9 g/dL (ref 30.0–36.0)
MCV: 91.1 fL (ref 80.0–100.0)
Monocytes Absolute: 0.5 10*3/uL (ref 0.1–1.0)
Monocytes Relative: 9 %
Neutro Abs: 3.4 10*3/uL (ref 1.7–7.7)
Neutrophils Relative %: 57 %
Platelet Count: 369 10*3/uL (ref 150–400)
RBC: 4.17 MIL/uL (ref 3.87–5.11)
RDW: 13.9 % (ref 11.5–15.5)
WBC Count: 6 10*3/uL (ref 4.0–10.5)
nRBC: 0 % (ref 0.0–0.2)

## 2019-01-06 LAB — CMP (CANCER CENTER ONLY)
ALT: 14 U/L (ref 0–44)
AST: 18 U/L (ref 15–41)
Albumin: 3.9 g/dL (ref 3.5–5.0)
Alkaline Phosphatase: 92 U/L (ref 38–126)
Anion gap: 13 (ref 5–15)
BUN: 19 mg/dL (ref 8–23)
CO2: 19 mmol/L — ABNORMAL LOW (ref 22–32)
Calcium: 10.1 mg/dL (ref 8.9–10.3)
Chloride: 106 mmol/L (ref 98–111)
Creatinine: 1.02 mg/dL — ABNORMAL HIGH (ref 0.44–1.00)
GFR, Est AFR Am: >60 mL/min (ref >60)
GFR, Estimated: 55 mL/min — ABNORMAL LOW (ref >60)
Glucose, Bld: 189 mg/dL — ABNORMAL HIGH (ref 70–99)
Potassium: 3.9 mmol/L (ref 3.5–5.1)
Sodium: 138 mmol/L (ref 135–145)
Total Bilirubin: 0.4 mg/dL (ref 0.3–1.2)
Total Protein: 7.2 g/dL (ref 6.5–8.1)

## 2019-01-06 LAB — MAGNESIUM: Magnesium: 1.9 mg/dL (ref 1.7–2.4)

## 2019-01-06 MED ORDER — SODIUM CHLORIDE 0.9 % IV SOLN
506.0000 mg/m2 | Freq: Once | INTRAVENOUS | Status: AC
  Administered 2019-01-06: 1100 mg via INTRAVENOUS
  Filled 2019-01-06: qty 40

## 2019-01-06 MED ORDER — PALONOSETRON HCL INJECTION 0.25 MG/5ML
INTRAVENOUS | Status: AC
  Filled 2019-01-06: qty 5

## 2019-01-06 MED ORDER — CYANOCOBALAMIN 1000 MCG/ML IJ SOLN
INTRAMUSCULAR | Status: AC
  Filled 2019-01-06: qty 1

## 2019-01-06 MED ORDER — CYANOCOBALAMIN 1000 MCG/ML IJ SOLN
1000.0000 ug | Freq: Once | INTRAMUSCULAR | Status: AC
  Administered 2019-01-06: 1000 ug via INTRAMUSCULAR

## 2019-01-06 MED ORDER — SODIUM CHLORIDE 0.9 % IV SOLN
75.0000 mg/m2 | Freq: Once | INTRAVENOUS | Status: AC
  Administered 2019-01-06: 14:00:00 163 mg via INTRAVENOUS
  Filled 2019-01-06: qty 163

## 2019-01-06 MED ORDER — PALONOSETRON HCL INJECTION 0.25 MG/5ML
0.2500 mg | Freq: Once | INTRAVENOUS | Status: AC
  Administered 2019-01-06: 0.25 mg via INTRAVENOUS

## 2019-01-06 MED ORDER — SODIUM CHLORIDE 0.9 % IV SOLN
Freq: Once | INTRAVENOUS | Status: AC
  Administered 2019-01-06: 10:00:00 via INTRAVENOUS
  Filled 2019-01-06: qty 250

## 2019-01-06 MED ORDER — POTASSIUM CHLORIDE 2 MEQ/ML IV SOLN
Freq: Once | INTRAVENOUS | Status: AC
  Administered 2019-01-06: 10:00:00 via INTRAVENOUS
  Filled 2019-01-06: qty 10

## 2019-01-06 MED ORDER — SODIUM CHLORIDE 0.9 % IV SOLN
Freq: Once | INTRAVENOUS | Status: AC
  Administered 2019-01-06: 13:00:00 via INTRAVENOUS
  Filled 2019-01-06: qty 5

## 2019-01-06 NOTE — Patient Instructions (Signed)
[facility] Discharge Instructions for Patients Receiving Chemotherapy  Today you received the following chemotherapy agents: Pemetrexed (Alimta) and Cisplatin (Platinol)  To help prevent nausea and vomiting after your treatment, we encourage you to take your nausea medication as directed.   If you develop nausea and vomiting that is not controlled by your nausea medication, call the clinic.   BELOW ARE SYMPTOMS THAT SHOULD BE REPORTED IMMEDIATELY:  *FEVER GREATER THAN 100.5 F  *CHILLS WITH OR WITHOUT FEVER  NAUSEA AND VOMITING THAT IS NOT CONTROLLED WITH YOUR NAUSEA MEDICATION  *UNUSUAL SHORTNESS OF BREATH  *UNUSUAL BRUISING OR BLEEDING  TENDERNESS IN MOUTH AND THROAT WITH OR WITHOUT PRESENCE OF ULCERS  *URINARY PROBLEMS  *BOWEL PROBLEMS  UNUSUAL RASH Items with * indicate a potential emergency and should be followed up as soon as possible.  Feel free to call the clinic should you have any questions or concerns. The clinic phone number is [phone].  Please show the [place] at check-in to the Emergency Department and triage nurse.  Coronavirus (COVID-19) Are you at risk?  Are you at risk for the Coronavirus (COVID-19)?  To be considered HIGH RISK for Coronavirus (COVID-19), you have to meet the following criteria:  . Traveled to [place], [place], [place], [place] or [place]; or in the [place] to [place], [place], [place], or [place]; and have fever, cough, and shortness of breath within the last 2 weeks of travel OR . Been in close contact with a person diagnosed with COVID-19 within the last 2 weeks and have fever, cough, and shortness of breath . IF YOU DO NOT MEET THESE CRITERIA, YOU ARE CONSIDERED LOW RISK FOR COVID-19.  What to do if you are HIGH RISK for COVID-19?  Marland Kitchen If you are having a medical emergency, call 911. . Seek medical care right away. Before you go to a doctor's office, urgent care or emergency department, call  ahead and tell them about your recent travel, contact with someone diagnosed with COVID-19, and your symptoms. You should receive instructions from your physician's office regarding next steps of care.  . When you arrive at healthcare provider, tell the healthcare staff immediately you have returned from visiting [place], [place], [place], [place] or [place]; or traveled in the [place] to [place], [place], [place], or [place]; in the last two weeks or you have been in close contact with a person diagnosed with COVID-19 in the last 2 weeks.   . Tell the health care staff about your symptoms: fever, cough and shortness of breath. . After you have been seen by a medical provider, you will be either: o Tested for (COVID-19) and discharged home on quarantine except to seek medical care if symptoms worsen, and asked to  - Stay home and avoid contact with others until you get your results (4-5 days)  - Avoid travel on public transportation if possible (such as bus, train, or airplane) or o Sent to the Emergency Department by EMS for evaluation, COVID-19 testing, and possible admission depending on your condition and test results.  What to do if you are LOW RISK for COVID-19?  Reduce your risk of any infection by using the same precautions used for avoiding the common cold or flu:  Marland Kitchen Wash your hands often with soap and warm water for at least 20 seconds.  If soap and water are not readily available, use an alcohol-based hand sanitizer with at least 60% alcohol.  Marland Kitchen  If coughing or sneezing, cover your mouth and nose by coughing or sneezing into the elbow areas of your shirt or coat, into a tissue or into your sleeve (not your hands). . Avoid shaking hands with others and consider head nods or verbal greetings only. . Avoid touching your eyes, nose, or mouth with unwashed hands.  . Avoid close contact with people who are sick. . Avoid places or events with large numbers of people in one location,  like concerts or sporting events. . Carefully consider travel plans you have or are making. . If you are planning any travel outside or inside the [place], visit the CDC's Travelers' Health webpage for the latest health notices. . If you have some symptoms but not all symptoms, continue to monitor at home and seek medical attention if your symptoms worsen. . If you are having a medical emergency, call 911.   [place] / e-Visit: [email or web address]         [facility]: [facility]: [phone]                   [facility]: [phone]

## 2019-01-06 NOTE — Progress Notes (Signed)
About 5 min into Alimta infusion, pt c/o of pain and discomfort at he IV site. Infusion paused and site assessed. Noted some tightness around site and pt verbalized tenderness on palpation. Tim Nichols-RN assisted with restarting IV in new area to resume Alimta infusion, and Van Tanner-PA at chairside to assess pt's arm. Able to aspirate ~0.74mL of bloody fluid off of old site. Per Lucianne Lei: Alimta is considered a neutral agent, and no special interventions are required. Original IV D/C'd intact, and pt encouraged to put heat or cold on the area (whichever she prefers) to help with swelling. Per Lucianne Lei: pt does not need to return to clinic to assess original site unless she continues to have pain/discomfort, any worsening swelling, or signs of potential  infection to area.

## 2019-01-06 NOTE — Progress Notes (Signed)
[place] OFFICE PROGRESS NOTE  Pa, [facility] [address] [place] [address]  DIAGNOSIS: Stage IIB(T3, N0, M0) non-small cell lung cancer, adenocarcinoma diagnosed in May 2020.  Biomarker Findings Microsatellite status - Cannot Be Determined Tumor Mutational Burden - Cannot Be Determined Genomic Findings For a complete list of the genes assayed, please refer to the Appendix. KRAS G12V TP53 G244V 7 Disease relevant genes with no reportable alterations: ALK, BRAF, EGFR, ERBB2, MET, RET, ROS1  PDL 1 expression:negative  PRIOR THERAPY: Status post wedge resection x3 of the right lower lobe with lymph node sampling under the care of Dr. Roxan Hockey on 10/09/2018  CURRENT THERAPY: Adjuvant systemic chemotherapy with cisplatin 75 mg/M2 and Alimta 500 mg/M2 every 3 weeks. First dose November 22, 2018. Status post 2 cycles.   INTERVAL HISTORY: Stephanie Crawford 73 y.o. female returns to the clinic today for a follow up visit. The patient is feeling fairly well today without any concerning complaints except for fatigue. The patient had an incident about 4 days following her last cycle of treatment. The patient was at a store in which the "ventilation was poor". She was seated and became overheated and passed out for a few seconds. She denies any associated symptoms preceding losing consciousness except for feeling flushed. She denies any nausea, vomiting, visual changes, dizziness, or palpitations. EMS was called and she was evaluated. Her vitals were reportedly within normal limits. No further intervention was needed at that time. She did not hit her head and is not on a blood thinner. She denies any new medications. She is trying to stay hydrated by drinking 3-4 8 ounce bottles of water per day. Since this incident, she has felt fairly well over the last two weeks.   The patient continues to tolerate treatment with adjuvant chemotherapy  well without any adverse effects except for the fatigue. Denies any fever, chills, night sweats, or weight loss. Denies any chest pain, shortness of breath, cough, or hemoptysis. Denies any nausea, vomiting, diarrhea, or constipation. Denies any headache or visual changes. The patient is here today for evaluation prior to starting cycle #3.  MEDICAL HISTORY: Past Medical History:  Diagnosis Date  . Allergic rhinitis   . Allergy   . Anemia    prior to hysterectomy--1997  . GERD (gastroesophageal reflux disease)   . Heart murmur   . Hemorrhoid 2011  . History of shingles 04/2014  . Nodule of lower lobe of right lung     ALLERGIES:  is allergic to adhesive [tape].  MEDICATIONS:  Current Outpatient Medications  Medication Sig Dispense Refill  . aspirin 325 MG EC tablet Take 650 mg by mouth daily as needed for pain.    Marland Kitchen azelastine (OPTIVAR) 0.05 % ophthalmic solution Place 1 drop into both eyes daily as needed (irritation).    . calcium carbonate (OS-CAL) 600 MG TABS tablet Take 1,200 mg by mouth every evening.     . Cholecalciferol (VITAMIN D) 50 MCG (2000 UT) tablet Take 2,000 Units by mouth at bedtime.    Marland Kitchen dexamethasone (DECADRON) 4 MG tablet 4 mg p.o. twice daily the day before, day of and day after chemotherapy every 3 weeks. 40 tablet 0  . diclofenac sodium (VOLTAREN) 1 % GEL Apply 2-4 grams to affected joint up to 4 times daily. 836 g 2  . folic acid (FOLVITE) 1 MG tablet Take 1 tablet (1 mg total) by mouth daily. 30 tablet 4  . Omega-3  1000 MG CAPS Take 1,000 mg by mouth at bedtime.    . prochlorperazine (COMPAZINE) 10 MG tablet Take 1 tablet (10 mg total) by mouth every 6 (six) hours as needed for nausea or vomiting. 30 tablet 0   No current facility-administered medications for this visit.     SURGICAL HISTORY:  Past Surgical History:  Procedure Laterality Date  . ABDOMINAL HYSTERECTOMY  1997  . APPENDECTOMY  1960  . HEMORRHOID SURGERY  9/11   8/27 had a follow up  procedure.    . IR THORACENTESIS ASP PLEURAL SPACE W/IMG GUIDE  10/24/2018  . LIPOMA EXCISION  1975   neck, left breast and righ jaw.  Marland Kitchen VIDEO ASSISTED THORACOSCOPY (VATS)/WEDGE RESECTION Right 10/09/2018   Procedure: VIDEO ASSISTED THORACOSCOPY (VATS)/WEDGE RESECTION;  Surgeon: Melrose Nakayama, MD;  Location: [facility];  Service: Thoracic;  Laterality: Right;    REVIEW OF SYSTEMS:   Review of Systems  Constitutional: Positive for fatigue. Negative for appetite change, chills, fever and unexpected weight change.  HENT: Negative for mouth sores, nosebleeds, sore throat and trouble swallowing.   Eyes: Negative for eye problems and icterus.  Respiratory: Negative for cough, hemoptysis, shortness of breath and wheezing.   Cardiovascular: Negative for chest pain and leg swelling.  Gastrointestinal: Negative for abdominal pain, constipation, diarrhea, nausea and vomiting.  Genitourinary: Negative for bladder incontinence, difficulty urinating, dysuria, frequency and hematuria.   Musculoskeletal: Negative for back pain, gait problem, neck pain and neck stiffness.  Skin: Negative for itching and rash.  Neurological: Positive for one episode of syncope 2.5 weeks ago. Negative for dizziness, extremity weakness, gait problem, headaches, light-headedness and seizures.  Hematological: Negative for adenopathy. Does not bruise/bleed easily.  Psychiatric/Behavioral: Negative for confusion, depression and sleep disturbance. The patient is not nervous/anxious.     PHYSICAL EXAMINATION:  Last menstrual period 05/23/1995.  ECOG PERFORMANCE STATUS: 1 - Symptomatic but completely ambulatory  Physical Exam  Constitutional: Oriented to person, place, and time and well-developed, well-nourished, and in no distress.  HENT:  Head: Normocephalic and atraumatic.  Mouth/Throat: Oropharynx is clear and moist. No oropharyngeal exudate.  Eyes: Conjunctivae are normal. Right eye exhibits no discharge. Left eye  exhibits no discharge. No scleral icterus.  Neck: Normal range of motion. Neck supple.  Cardiovascular: Normal rate, regular rhythm, normal heart sounds and intact distal pulses.   Pulmonary/Chest: Effort normal and breath sounds normal. No respiratory distress. No wheezes. No rales.  Abdominal: Soft. Bowel sounds are normal. Exhibits no distension and no mass. There is no tenderness.  Musculoskeletal: Normal range of motion. Exhibits no edema.  Lymphadenopathy:    No cervical adenopathy.  Neurological: Alert and oriented to person, place, and time. Exhibits normal muscle tone. Gait normal. Coordination normal.  Skin: Skin is warm and dry. No rash noted. Not diaphoretic. No erythema. No pallor.  Psychiatric: Mood, memory and judgment normal.  Vitals reviewed.  LABORATORY DATA: Lab Results  Component Value Date   WBC 3.5 (L) 12/30/2018   HGB 12.3 12/30/2018   HCT 36.1 12/30/2018   MCV 92.1 12/30/2018   PLT 181 12/30/2018      Chemistry      Component Value Date/Time   NA 142 12/30/2018 1117   K 3.8 12/30/2018 1117   CL 106 12/30/2018 1117   CO2 24 12/30/2018 1117   BUN 14 12/30/2018 1117   CREATININE 1.10 (H) 12/30/2018 1117   CREATININE 0.75 07/14/2014 1231      Component Value Date/Time   CALCIUM 9.8  12/30/2018 1117   CALCIUM 10.1 12/28/2009 2231   ALKPHOS 93 12/30/2018 1117   AST 18 12/30/2018 1117   ALT 14 12/30/2018 1117   BILITOT 0.6 12/30/2018 1117       RADIOGRAPHIC STUDIES:  Dg Chest 2 View  Result Date: 12/10/2018 CLINICAL DATA:  Adenocarcinoma of the right lung, stage II. Status post VATS on 10/09/2018. Shortness of breath. Right pleural effusion. EXAM: CHEST - 2 VIEW COMPARISON:  Chest x-rays dated 11/12/2018 and 10/24/2018 FINDINGS: There has been further decrease in the small right pleural effusion. Improved aeration at the right lung base. Left lung is clear. No pneumothorax. Heart size and vascularity are normal. No bone abnormality. IMPRESSION: Further  decrease in the small right pleural effusion. Improved aeration at the right lung base. Electronically Signed   By: Lorriane Shire M.D.   On: 12/10/2018 13:58     ASSESSMENT/PLAN:  This is a very pleasant 73 year old African-American female with stage IIb (T3, N0, M0) non-small cell lung cancer, adenocarcinoma.  She has multifocal disease in the right lower lobe. She is status post a wedge resection x3 of the right lower lobe with lymph node sampling in May 2020.  She has no actionable mutations and her PDL 1 expression is negative.  The patient is currently undergoing adjuvant platinum based chemotherapy with cisplatin 75 mg/m and Alimta 500 mg/m every 3 weeks.  She is status post 2 cycles of treatment. She has tolerated it without any adverse effects.  The patient was seen with Dr. Julien Nordmann today.  Labs were reviewed with the patient.  We recommend that she proceed with cycle #3 today as scheduled.  We will see her back for follow-up visit in 3 weeks for evaluation before starting cycle #4.  She will continue taking her folic acid 1 mg p.o. daily, Compazine 10 mg p.o. every 6 hours as needed for nausea, as well as Decadron 4 mg p.o. twice daily the day before, the day of chemotherapy, and the day after.   The patient was encouraged to stay hydrated.   The patient was advised to call immediately if she has any concerning symptoms in the interval. The patient voices understanding of current disease status and treatment options and is in agreement with the current care plan. All questions were answered. The patient knows to call the clinic with any problems, questions or concerns. We can certainly see the patient much sooner if necessary  No orders of the defined types were placed in this encounter.    Ginna Schuur L Helaina Stefano, PA-C 01/06/19  ADDENDUM: Hematology/Oncology Attending: I had a face-to-face encounter with the patient today.  I recommended her care plan.  This is a very  pleasant 73 years old African-American female with a stage IIb non-small cell lung cancer, adenocarcinoma status post wedge resection of the lesion from the right lower lobe with lymph node sampling in May 2020. The patient is currently undergoing adjuvant systemic chemotherapy with cisplatin and Alimta status post 2 cycles.  The patient is tolerating her treatment well with no concerning adverse effects.  She denied having any nausea, vomiting, diarrhea or constipation.  She has no peripheral neuropathy. I recommended for her to proceed with cycle #3 today as planned. She will come back for follow-up visit in 3 weeks for evaluation before the next cycle of her treatment. She was advised to call immediately if she has any concerning symptoms in the interval.  Disclaimer: This note was dictated with voice recognition software. Similar sounding words  can inadvertently be transcribed and may be missed upon review. Eilleen Kempf, MD 01/06/19

## 2019-01-07 NOTE — Progress Notes (Signed)
   DATE: 01/06/2019      IV EXTRAVASATION (neutral)   MD:  Fanny Bien. Mohamed  AGENT RECEIVED AT TIME OF EXTRAVASATION:   Alimta  IV SITE LOCATION: Left lateral forearm  INTERVENTION:  1) IV stopped  2) 0.1 ml liquid/blood aspirated from IV site.  3) Patient Teaching Instruction Sheet reviewed with Stephanie Crawford.  A) Apply heat to area for 15 to 20 minutes 4 times daily for the next 48 hours B) Elevate the affected site for the next 48 hours. C) YASMYN BELLISARIO was instructed to call [phone] if she has questions or notes acute changes to the area.  D) A new IV was started and the patient's Alimta fusion was completed.   Review of Systems  Constitutional: Negative for chills, diaphoresis and fever.  HENT: Negative for trouble swallowing and voice change.   Respiratory: Negative for cough, chest tightness, shortness of breath and wheezing.   Cardiovascular: Negative for chest pain and palpitations.  Gastrointestinal: Negative for abdominal pain, constipation, diarrhea, nausea and vomiting.  Musculoskeletal: Negative for back pain and myalgias.  Neurological: Negative for dizziness, light-headedness and headaches.    Physical Exam Constitutional:      General: She is not in acute distress.    Appearance: She is not ill-appearing.  HENT:     Head: Normocephalic and atraumatic.  Musculoskeletal:        General: No swelling or tenderness.  Skin:    Comments: An IV was noted and the left lateral forearm.  There was no evidence of skin breakdown, induration, tenderness, erythema, or swelling.  Neurological:     Mental Status: She is alert.  Psychiatric:        Mood and Affect: Mood normal.        Behavior: Behavior normal.        Thought Content: Thought content normal.        Judgment: Judgment normal.     Sandi Mealy, MHS, PA-C

## 2019-01-13 ENCOUNTER — Inpatient Hospital Stay: Payer: BC Managed Care – PPO

## 2019-01-13 ENCOUNTER — Other Ambulatory Visit: Payer: Self-pay

## 2019-01-13 DIAGNOSIS — C3431 Malignant neoplasm of lower lobe, right bronchus or lung: Secondary | ICD-10-CM | POA: Diagnosis not present

## 2019-01-13 DIAGNOSIS — C3491 Malignant neoplasm of unspecified part of right bronchus or lung: Secondary | ICD-10-CM

## 2019-01-13 DIAGNOSIS — Z5111 Encounter for antineoplastic chemotherapy: Secondary | ICD-10-CM | POA: Diagnosis not present

## 2019-01-13 DIAGNOSIS — J90 Pleural effusion, not elsewhere classified: Secondary | ICD-10-CM | POA: Diagnosis not present

## 2019-01-13 LAB — MAGNESIUM: Magnesium: 1.8 mg/dL (ref 1.7–2.4)

## 2019-01-13 LAB — CBC WITH DIFFERENTIAL (CANCER CENTER ONLY)
Abs Immature Granulocytes: 0.01 10*3/uL (ref 0.00–0.07)
Basophils Absolute: 0 10*3/uL (ref 0.0–0.1)
Basophils Relative: 1 %
Eosinophils Absolute: 0 10*3/uL (ref 0.0–0.5)
Eosinophils Relative: 1 %
HCT: 37 % (ref 36.0–46.0)
Hemoglobin: 12.5 g/dL (ref 12.0–15.0)
Immature Granulocytes: 0 %
Lymphocytes Relative: 57 %
Lymphs Abs: 1.6 10*3/uL (ref 0.7–4.0)
MCH: 31.3 pg (ref 26.0–34.0)
MCHC: 33.8 g/dL (ref 30.0–36.0)
MCV: 92.5 fL (ref 80.0–100.0)
Monocytes Absolute: 0.2 10*3/uL (ref 0.1–1.0)
Monocytes Relative: 5 %
Neutro Abs: 1 10*3/uL — ABNORMAL LOW (ref 1.7–7.7)
Neutrophils Relative %: 36 %
Platelet Count: 198 10*3/uL (ref 150–400)
RBC: 4 MIL/uL (ref 3.87–5.11)
RDW: 13.7 % (ref 11.5–15.5)
WBC Count: 2.8 10*3/uL — ABNORMAL LOW (ref 4.0–10.5)
nRBC: 0 % (ref 0.0–0.2)

## 2019-01-13 LAB — CMP (CANCER CENTER ONLY)
ALT: 22 U/L (ref 0–44)
AST: 20 U/L (ref 15–41)
Albumin: 3.8 g/dL (ref 3.5–5.0)
Alkaline Phosphatase: 92 U/L (ref 38–126)
Anion gap: 11 (ref 5–15)
BUN: 19 mg/dL (ref 8–23)
CO2: 27 mmol/L (ref 22–32)
Calcium: 9.6 mg/dL (ref 8.9–10.3)
Chloride: 100 mmol/L (ref 98–111)
Creatinine: 1.03 mg/dL — ABNORMAL HIGH (ref 0.44–1.00)
GFR, Est AFR Am: >60 mL/min (ref >60)
GFR, Estimated: 54 mL/min — ABNORMAL LOW (ref >60)
Glucose, Bld: 88 mg/dL (ref 70–99)
Potassium: 4.1 mmol/L (ref 3.5–5.1)
Sodium: 138 mmol/L (ref 135–145)
Total Bilirubin: 0.7 mg/dL (ref 0.3–1.2)
Total Protein: 6.7 g/dL (ref 6.5–8.1)

## 2019-01-20 ENCOUNTER — Inpatient Hospital Stay: Payer: BC Managed Care – PPO

## 2019-01-20 ENCOUNTER — Other Ambulatory Visit: Payer: Self-pay

## 2019-01-20 DIAGNOSIS — J90 Pleural effusion, not elsewhere classified: Secondary | ICD-10-CM | POA: Diagnosis not present

## 2019-01-20 DIAGNOSIS — C3491 Malignant neoplasm of unspecified part of right bronchus or lung: Secondary | ICD-10-CM

## 2019-01-20 DIAGNOSIS — C3431 Malignant neoplasm of lower lobe, right bronchus or lung: Secondary | ICD-10-CM | POA: Diagnosis not present

## 2019-01-20 DIAGNOSIS — Z5111 Encounter for antineoplastic chemotherapy: Secondary | ICD-10-CM | POA: Diagnosis not present

## 2019-01-20 LAB — CBC WITH DIFFERENTIAL (CANCER CENTER ONLY)
Abs Immature Granulocytes: 0.01 10*3/uL (ref 0.00–0.07)
Basophils Absolute: 0 10*3/uL (ref 0.0–0.1)
Basophils Relative: 0 %
Eosinophils Absolute: 0.1 10*3/uL (ref 0.0–0.5)
Eosinophils Relative: 2 %
HCT: 34.9 % — ABNORMAL LOW (ref 36.0–46.0)
Hemoglobin: 12 g/dL (ref 12.0–15.0)
Immature Granulocytes: 0 %
Lymphocytes Relative: 55 %
Lymphs Abs: 1.7 10*3/uL (ref 0.7–4.0)
MCH: 31.5 pg (ref 26.0–34.0)
MCHC: 34.4 g/dL (ref 30.0–36.0)
MCV: 91.6 fL (ref 80.0–100.0)
Monocytes Absolute: 0.4 10*3/uL (ref 0.1–1.0)
Monocytes Relative: 14 %
Neutro Abs: 0.9 10*3/uL — ABNORMAL LOW (ref 1.7–7.7)
Neutrophils Relative %: 29 %
Platelet Count: 179 10*3/uL (ref 150–400)
RBC: 3.81 MIL/uL — ABNORMAL LOW (ref 3.87–5.11)
RDW: 13.7 % (ref 11.5–15.5)
WBC Count: 3.1 10*3/uL — ABNORMAL LOW (ref 4.0–10.5)
nRBC: 0 % (ref 0.0–0.2)

## 2019-01-20 LAB — CMP (CANCER CENTER ONLY)
ALT: 13 U/L (ref 0–44)
AST: 17 U/L (ref 15–41)
Albumin: 3.9 g/dL (ref 3.5–5.0)
Alkaline Phosphatase: 78 U/L (ref 38–126)
Anion gap: 10 (ref 5–15)
BUN: 12 mg/dL (ref 8–23)
CO2: 27 mmol/L (ref 22–32)
Calcium: 9.7 mg/dL (ref 8.9–10.3)
Chloride: 102 mmol/L (ref 98–111)
Creatinine: 1 mg/dL (ref 0.44–1.00)
GFR, Est AFR Am: >60 mL/min (ref >60)
GFR, Estimated: 56 mL/min — ABNORMAL LOW (ref >60)
Glucose, Bld: 102 mg/dL — ABNORMAL HIGH (ref 70–99)
Potassium: 3.8 mmol/L (ref 3.5–5.1)
Sodium: 139 mmol/L (ref 135–145)
Total Bilirubin: 0.8 mg/dL (ref 0.3–1.2)
Total Protein: 6.8 g/dL (ref 6.5–8.1)

## 2019-01-20 LAB — MAGNESIUM: Magnesium: 1.7 mg/dL (ref 1.7–2.4)

## 2019-01-28 ENCOUNTER — Telehealth: Payer: Self-pay | Admitting: Internal Medicine

## 2019-01-28 ENCOUNTER — Inpatient Hospital Stay: Payer: BC Managed Care – PPO

## 2019-01-28 ENCOUNTER — Inpatient Hospital Stay: Payer: BC Managed Care – PPO | Attending: Internal Medicine | Admitting: Physician Assistant

## 2019-01-28 ENCOUNTER — Other Ambulatory Visit: Payer: Self-pay

## 2019-01-28 VITALS — BP 145/88 | HR 96

## 2019-01-28 VITALS — BP 159/95 | HR 106 | Temp 99.8°F | Resp 18

## 2019-01-28 DIAGNOSIS — C3431 Malignant neoplasm of lower lobe, right bronchus or lung: Secondary | ICD-10-CM | POA: Diagnosis not present

## 2019-01-28 DIAGNOSIS — Z5111 Encounter for antineoplastic chemotherapy: Secondary | ICD-10-CM | POA: Insufficient documentation

## 2019-01-28 DIAGNOSIS — C3491 Malignant neoplasm of unspecified part of right bronchus or lung: Secondary | ICD-10-CM | POA: Diagnosis not present

## 2019-01-28 DIAGNOSIS — R5383 Other fatigue: Secondary | ICD-10-CM | POA: Insufficient documentation

## 2019-01-28 LAB — CMP (CANCER CENTER ONLY)
ALT: 12 U/L (ref 0–44)
AST: 16 U/L (ref 15–41)
Albumin: 4.1 g/dL (ref 3.5–5.0)
Alkaline Phosphatase: 82 U/L (ref 38–126)
Anion gap: 14 (ref 5–15)
BUN: 19 mg/dL (ref 8–23)
CO2: 19 mmol/L — ABNORMAL LOW (ref 22–32)
Calcium: 10.3 mg/dL (ref 8.9–10.3)
Chloride: 105 mmol/L (ref 98–111)
Creatinine: 1.13 mg/dL — ABNORMAL HIGH (ref 0.44–1.00)
GFR, Est AFR Am: 56 mL/min — ABNORMAL LOW (ref >60)
GFR, Estimated: 49 mL/min — ABNORMAL LOW (ref >60)
Glucose, Bld: 183 mg/dL — ABNORMAL HIGH (ref 70–99)
Potassium: 4.2 mmol/L (ref 3.5–5.1)
Sodium: 138 mmol/L (ref 135–145)
Total Bilirubin: 0.5 mg/dL (ref 0.3–1.2)
Total Protein: 7.1 g/dL (ref 6.5–8.1)

## 2019-01-28 LAB — CBC WITH DIFFERENTIAL (CANCER CENTER ONLY)
Abs Immature Granulocytes: 0.05 10*3/uL (ref 0.00–0.07)
Basophils Absolute: 0 10*3/uL (ref 0.0–0.1)
Basophils Relative: 1 %
Eosinophils Absolute: 0.1 10*3/uL (ref 0.0–0.5)
Eosinophils Relative: 2 %
HCT: 36 % (ref 36.0–46.0)
Hemoglobin: 12.4 g/dL (ref 12.0–15.0)
Immature Granulocytes: 1 %
Lymphocytes Relative: 31 %
Lymphs Abs: 1.8 10*3/uL (ref 0.7–4.0)
MCH: 31.9 pg (ref 26.0–34.0)
MCHC: 34.4 g/dL (ref 30.0–36.0)
MCV: 92.5 fL (ref 80.0–100.0)
Monocytes Absolute: 0.5 10*3/uL (ref 0.1–1.0)
Monocytes Relative: 8 %
Neutro Abs: 3.3 10*3/uL (ref 1.7–7.7)
Neutrophils Relative %: 57 %
Platelet Count: 359 10*3/uL (ref 150–400)
RBC: 3.89 MIL/uL (ref 3.87–5.11)
RDW: 13.9 % (ref 11.5–15.5)
WBC Count: 5.7 10*3/uL (ref 4.0–10.5)
nRBC: 0 % (ref 0.0–0.2)

## 2019-01-28 LAB — MAGNESIUM: Magnesium: 1.8 mg/dL (ref 1.7–2.4)

## 2019-01-28 MED ORDER — PALONOSETRON HCL INJECTION 0.25 MG/5ML
0.2500 mg | Freq: Once | INTRAVENOUS | Status: AC
  Administered 2019-01-28: 0.25 mg via INTRAVENOUS

## 2019-01-28 MED ORDER — SODIUM CHLORIDE 0.9 % IV SOLN
Freq: Once | INTRAVENOUS | Status: AC
  Administered 2019-01-28: 13:00:00 via INTRAVENOUS
  Filled 2019-01-28: qty 5

## 2019-01-28 MED ORDER — SODIUM CHLORIDE 0.9 % IV SOLN
Freq: Once | INTRAVENOUS | Status: AC
  Administered 2019-01-28: 11:00:00 via INTRAVENOUS
  Filled 2019-01-28: qty 250

## 2019-01-28 MED ORDER — PALONOSETRON HCL INJECTION 0.25 MG/5ML
INTRAVENOUS | Status: AC
  Filled 2019-01-28: qty 5

## 2019-01-28 MED ORDER — POTASSIUM CHLORIDE 2 MEQ/ML IV SOLN
Freq: Once | INTRAVENOUS | Status: AC
  Administered 2019-01-28: 11:00:00 via INTRAVENOUS
  Filled 2019-01-28: qty 10

## 2019-01-28 MED ORDER — SODIUM CHLORIDE 0.9 % IV SOLN
510.0000 mg/m2 | Freq: Once | INTRAVENOUS | Status: AC
  Administered 2019-01-28: 14:00:00 1100 mg via INTRAVENOUS
  Filled 2019-01-28: qty 40

## 2019-01-28 MED ORDER — SODIUM CHLORIDE 0.9 % IV SOLN
75.0000 mg/m2 | Freq: Once | INTRAVENOUS | Status: AC
  Administered 2019-01-28: 163 mg via INTRAVENOUS
  Filled 2019-01-28: qty 163

## 2019-01-28 NOTE — Telephone Encounter (Signed)
Scheduled appt per 9/8 los - gave patient AVS and calender per los.

## 2019-01-28 NOTE — Progress Notes (Signed)
At 1426 with beginning of Alimta infusion, patient c/o pain to IV site, right forearm. Alimta paused.  RN assessed, no redness, swelling, or coolness to touch.  Pt reporting tenderness upon palpation. Blood return noted.  Pt request IV site change, "this happens every time."      IV site changed by Janifer Adie, RN to left [facility].  Sandi Mealy, PA-C notified.  IV cath to right forearm removed.  Patient tolerating well, will continue to monitor.

## 2019-01-28 NOTE — Patient Instructions (Signed)
[place] Discharge Instructions for Patients Receiving Chemotherapy  Today you received the following chemotherapy agents: Pemetrexed (Alimta) and Cisplatin (Platinol)  To help prevent nausea and vomiting after your treatment, we encourage you to take your nausea medication as directed.   If you develop nausea and vomiting that is not controlled by your nausea medication, call the clinic.   BELOW ARE SYMPTOMS THAT SHOULD BE REPORTED IMMEDIATELY:  *FEVER GREATER THAN 100.5 F  *CHILLS WITH OR WITHOUT FEVER  NAUSEA AND VOMITING THAT IS NOT CONTROLLED WITH YOUR NAUSEA MEDICATION  *UNUSUAL SHORTNESS OF BREATH  *UNUSUAL BRUISING OR BLEEDING  TENDERNESS IN MOUTH AND THROAT WITH OR WITHOUT PRESENCE OF ULCERS  *URINARY PROBLEMS  *BOWEL PROBLEMS  UNUSUAL RASH Items with * indicate a potential emergency and should be followed up as soon as possible.  Feel free to call the clinic should you have any questions or concerns. The clinic phone number is [phone].  Please show the [place] at check-in to the Emergency Department and triage nurse.  Coronavirus (COVID-19) Are you at risk?  Are you at risk for the Coronavirus (COVID-19)?  To be considered HIGH RISK for Coronavirus (COVID-19), you have to meet the following criteria:  . Traveled to [place], [place], [place], [place] or [place]; or in the [place] to [place], [place], [place], or [place]; and have fever, cough, and shortness of breath within the last 2 weeks of travel OR . Been in close contact with a person diagnosed with COVID-19 within the last 2 weeks and have fever, cough, and shortness of breath . IF YOU DO NOT MEET THESE CRITERIA, YOU ARE CONSIDERED LOW RISK FOR COVID-19.  What to do if you are HIGH RISK for COVID-19?  Marland Kitchen If you are having a medical emergency, call 911. . Seek medical care right away. Before you go to a doctor's office, urgent care or emergency department, call  ahead and tell them about your recent travel, contact with someone diagnosed with COVID-19, and your symptoms. You should receive instructions from your physician's office regarding next steps of care.  . When you arrive at healthcare provider, tell the healthcare staff immediately you have returned from visiting [place], [place], [place], [place] or [place]; or traveled in the [place] to [place], [place], [place], or [place]; in the last two weeks or you have been in close contact with a person diagnosed with COVID-19 in the last 2 weeks.   . Tell the health care staff about your symptoms: fever, cough and shortness of breath. . After you have been seen by a medical provider, you will be either: o Tested for (COVID-19) and discharged home on quarantine except to seek medical care if symptoms worsen, and asked to  - Stay home and avoid contact with others until you get your results (4-5 days)  - Avoid travel on public transportation if possible (such as bus, train, or airplane) or o Sent to the Emergency Department by EMS for evaluation, COVID-19 testing, and possible admission depending on your condition and test results.  What to do if you are LOW RISK for COVID-19?  Reduce your risk of any infection by using the same precautions used for avoiding the common cold or flu:  Marland Kitchen Wash your hands often with soap and warm water for at least 20 seconds.  If soap and water are not readily available, use an alcohol-based hand sanitizer with at least 60% alcohol.  Marland Kitchen  If coughing or sneezing, cover your mouth and nose by coughing or sneezing into the elbow areas of your shirt or coat, into a tissue or into your sleeve (not your hands). . Avoid shaking hands with others and consider head nods or verbal greetings only. . Avoid touching your eyes, nose, or mouth with unwashed hands.  . Avoid close contact with people who are sick. . Avoid places or events with large numbers of people in one location,  like concerts or sporting events. . Carefully consider travel plans you have or are making. . If you are planning any travel outside or inside the [place], visit the CDC's Travelers' Health webpage for the latest health notices. . If you have some symptoms but not all symptoms, continue to monitor at home and seek medical attention if your symptoms worsen. . If you are having a medical emergency, call 911.   [place] / e-Visit: [email or web address]         [facility]: [facility]: [phone]                   [facility]: [phone]

## 2019-01-28 NOTE — Progress Notes (Signed)
[place] OFFICE PROGRESS NOTE  Pa, [facility] [address] [place] [id number]  DIAGNOSIS: Stage IIB(T3, N0, M0) non-small cell lung cancer, adenocarcinoma diagnosed in May 2020.  Biomarker Findings Microsatellite status - Cannot Be Determined Tumor Mutational Burden - Cannot Be Determined Genomic Findings For a complete list of the genes assayed, please refer to the Appendix. KRAS G12V TP53 G244V 7 Disease relevant genes with no reportable alterations: ALK, BRAF, EGFR, ERBB2, MET, RET, ROS1  PDL 1 expression:negative  PRIOR THERAPY: Status post wedge resection x3 of the right lower lobe with lymph node sampling under the care of Dr. Roxan Hockey on 10/09/2018  CURRENT THERAPY: Adjuvant systemic chemotherapy with cisplatin 75 mg/M2 and Alimta 500 mg/M2 every 3 weeks. First dose November 22, 2018. Status post 3 cycles.  INTERVAL HISTORY: Stephanie Crawford 73 y.o. female returns to the clinic for a follow-up visit.  The patient is feeling well today without any concerning complaints except for fatigue. She has been tolerating her treatment fairly well.  She denies any fever, chills, night sweats, or weight loss.  She denies any chest pain, shortness of breath, cough, or hemoptysis.  She denies any nausea, vomiting, diarrhea, or constipation.  She denies any headache or visual changes.  She is here today for evaluation before starting her last cycle of adjuvant chemotherapy, cycle #4.  MEDICAL HISTORY: Past Medical History:  Diagnosis Date  . Allergic rhinitis   . Allergy   . Anemia    prior to hysterectomy--1997  . GERD (gastroesophageal reflux disease)   . Heart murmur   . Hemorrhoid 2011  . History of shingles 04/2014  . Nodule of lower lobe of right lung     ALLERGIES:  is allergic to adhesive [tape].  MEDICATIONS:  Current Outpatient Medications  Medication Sig Dispense Refill  . aspirin 325 MG EC tablet Take  650 mg by mouth daily as needed for pain.    Marland Kitchen azelastine (OPTIVAR) 0.05 % ophthalmic solution Place 1 drop into both eyes daily as needed (irritation).    . calcium carbonate (OS-CAL) 600 MG TABS tablet Take 1,200 mg by mouth every evening.     . Cholecalciferol (VITAMIN D) 50 MCG (2000 UT) tablet Take 2,000 Units by mouth at bedtime.    Marland Kitchen dexamethasone (DECADRON) 4 MG tablet 4 mg p.o. twice daily the day before, day of and day after chemotherapy every 3 weeks. 40 tablet 0  . diclofenac sodium (VOLTAREN) 1 % GEL Apply 2-4 grams to affected joint up to 4 times daily. 967 g 2  . folic acid (FOLVITE) 1 MG tablet Take 1 tablet (1 mg total) by mouth daily. 30 tablet 4  . Omega-3 1000 MG CAPS Take 1,000 mg by mouth at bedtime.    . prochlorperazine (COMPAZINE) 10 MG tablet Take 1 tablet (10 mg total) by mouth every 6 (six) hours as needed for nausea or vomiting. 30 tablet 0   No current facility-administered medications for this visit.     SURGICAL HISTORY:  Past Surgical History:  Procedure Laterality Date  . ABDOMINAL HYSTERECTOMY  1997  . APPENDECTOMY  1960  . HEMORRHOID SURGERY  9/11   8/27 had a follow up procedure.    . IR THORACENTESIS ASP PLEURAL SPACE W/IMG GUIDE  10/24/2018  . LIPOMA EXCISION  1975   neck, left breast and righ jaw.  Marland Kitchen VIDEO ASSISTED THORACOSCOPY (VATS)/WEDGE RESECTION Right 10/09/2018   Procedure: VIDEO ASSISTED THORACOSCOPY (VATS)/WEDGE RESECTION;  Surgeon: Melrose Nakayama, MD;  Location: [facility];  Service: Thoracic;  Laterality: Right;    REVIEW OF SYSTEMS:   Review of Systems  Constitutional: Positive for fatigue.  Negative for appetite change, chills, fever and unexpected weight change.  HENT:   Negative for mouth sores, nosebleeds, sore throat and trouble swallowing.   Eyes: Negative for eye problems and icterus.  Respiratory: Negative for cough, hemoptysis, shortness of breath and wheezing.   Cardiovascular: Negative for chest pain and leg swelling.   Gastrointestinal: Negative for abdominal pain, constipation, diarrhea, nausea and vomiting.  Genitourinary: Negative for bladder incontinence, difficulty urinating, dysuria, frequency and hematuria.   Musculoskeletal: Negative for back pain, gait problem, neck pain and neck stiffness.  Skin: Negative for itching and rash.  Neurological: Negative for dizziness, extremity weakness, gait problem, headaches, light-headedness and seizures.  Hematological: Negative for adenopathy. Does not bruise/bleed easily.  Psychiatric/Behavioral: Negative for confusion, depression and sleep disturbance. The patient is not nervous/anxious.     PHYSICAL EXAMINATION:  Last menstrual period 05/23/1995.  ECOG PERFORMANCE STATUS: 1 - Symptomatic but completely ambulatory  Physical Exam  Constitutional: Oriented to person, place, and time and well-developed, well-nourished, and in no distress. No distress.  HENT:  Head: Normocephalic and atraumatic.  Mouth/Throat: Oropharynx is clear and moist. No oropharyngeal exudate.  Eyes: Conjunctivae are normal. Right eye exhibits no discharge. Left eye exhibits no discharge. No scleral icterus.  Neck: Normal range of motion. Neck supple.  Cardiovascular: Normal rate, regular rhythm, normal heart sounds and intact distal pulses.   Pulmonary/Chest: Effort normal and breath sounds normal. No respiratory distress. No wheezes. No rales.  Abdominal: Soft. Bowel sounds are normal. Exhibits no distension and no mass. There is no tenderness.  Musculoskeletal: Normal range of motion. Exhibits no edema.  Lymphadenopathy:    No cervical adenopathy.  Neurological: Alert and oriented to person, place, and time. Exhibits normal muscle tone. Gait normal. Coordination normal.  Skin: Skin is warm and dry. No rash noted. Not diaphoretic. No erythema. No pallor.  Psychiatric: Mood, memory and judgment normal.  Vitals reviewed.  LABORATORY DATA: Lab Results  Component Value Date   WBC  3.1 (L) 01/20/2019   HGB 12.0 01/20/2019   HCT 34.9 (L) 01/20/2019   MCV 91.6 01/20/2019   PLT 179 01/20/2019      Chemistry      Component Value Date/Time   NA 139 01/20/2019 1114   K 3.8 01/20/2019 1114   CL 102 01/20/2019 1114   CO2 27 01/20/2019 1114   BUN 12 01/20/2019 1114   CREATININE 1.00 01/20/2019 1114   CREATININE 0.75 07/14/2014 1231      Component Value Date/Time   CALCIUM 9.7 01/20/2019 1114   CALCIUM 10.1 12/28/2009 2231   ALKPHOS 78 01/20/2019 1114   AST 17 01/20/2019 1114   ALT 13 01/20/2019 1114   BILITOT 0.8 01/20/2019 1114       RADIOGRAPHIC STUDIES:  No results found.   ASSESSMENT/PLAN:  This is a very pleasant 73 year old African-American female with stage IIb (T3, N0, M0) non-small cell lung cancer, adenocarcinoma.She has multifocal disease in the right lower lobe. She is status post a wedge resection x3 of the right lower lobe with lymph node sampling in May 2020. She has no actionable mutations and her PDL 1 expression is negative.  The patient is currently undergoing adjuvant platinum based chemotherapy with cisplatin 75 mg/m and Alimta 500 mg/m every 3 weeks.She is status post 3cycles of treatment. She hastolerated itwell  without any adverse effects.  Labs were reviewed with the patient. I recommend that she proceed with her last cycle of adjuvant chemotherapy, cycle #4 today as scheduled.  I will arrange for her to have a restaging CT scan of the chest performed prior to her next visit.  We will see her back for follow-up visit in 4 weeks for evaluation and to review her scan results.   The patient was advised to call immediately if she has any concerning symptoms in the interval. The patient voices understanding of current disease status and treatment options and is in agreement with the current care plan. All questions were answered. The patient knows to call the clinic with any problems, questions or concerns. We can  certainly see the patient much sooner if necessary  No orders of the defined types were placed in this encounter.    Oanh Devivo L Frona Yost, PA-C 01/28/19

## 2019-01-28 NOTE — Patient Instructions (Signed)
Radiology Scheduling [phone]

## 2019-02-01 ENCOUNTER — Other Ambulatory Visit: Payer: Self-pay | Admitting: Rheumatology

## 2019-02-03 ENCOUNTER — Inpatient Hospital Stay: Payer: BC Managed Care – PPO

## 2019-02-03 ENCOUNTER — Other Ambulatory Visit: Payer: Self-pay

## 2019-02-03 DIAGNOSIS — C3491 Malignant neoplasm of unspecified part of right bronchus or lung: Secondary | ICD-10-CM

## 2019-02-03 DIAGNOSIS — Z5111 Encounter for antineoplastic chemotherapy: Secondary | ICD-10-CM | POA: Diagnosis not present

## 2019-02-03 DIAGNOSIS — R5383 Other fatigue: Secondary | ICD-10-CM | POA: Diagnosis not present

## 2019-02-03 DIAGNOSIS — C3431 Malignant neoplasm of lower lobe, right bronchus or lung: Secondary | ICD-10-CM | POA: Diagnosis not present

## 2019-02-03 LAB — CBC WITH DIFFERENTIAL (CANCER CENTER ONLY)
Abs Immature Granulocytes: 0.01 10*3/uL (ref 0.00–0.07)
Basophils Absolute: 0 10*3/uL (ref 0.0–0.1)
Basophils Relative: 1 %
Eosinophils Absolute: 0 10*3/uL (ref 0.0–0.5)
Eosinophils Relative: 0 %
HCT: 36.4 % (ref 36.0–46.0)
Hemoglobin: 12.6 g/dL (ref 12.0–15.0)
Immature Granulocytes: 0 %
Lymphocytes Relative: 44 %
Lymphs Abs: 1.6 10*3/uL (ref 0.7–4.0)
MCH: 31.7 pg (ref 26.0–34.0)
MCHC: 34.6 g/dL (ref 30.0–36.0)
MCV: 91.7 fL (ref 80.0–100.0)
Monocytes Absolute: 0.1 10*3/uL (ref 0.1–1.0)
Monocytes Relative: 2 %
Neutro Abs: 1.9 10*3/uL (ref 1.7–7.7)
Neutrophils Relative %: 53 %
Platelet Count: 216 10*3/uL (ref 150–400)
RBC: 3.97 MIL/uL (ref 3.87–5.11)
RDW: 13.2 % (ref 11.5–15.5)
WBC Count: 3.6 10*3/uL — ABNORMAL LOW (ref 4.0–10.5)
nRBC: 0 % (ref 0.0–0.2)

## 2019-02-03 LAB — CMP (CANCER CENTER ONLY)
ALT: 13 U/L (ref 0–44)
AST: 18 U/L (ref 15–41)
Albumin: 3.9 g/dL (ref 3.5–5.0)
Alkaline Phosphatase: 83 U/L (ref 38–126)
Anion gap: 10 (ref 5–15)
BUN: 23 mg/dL (ref 8–23)
CO2: 31 mmol/L (ref 22–32)
Calcium: 9.7 mg/dL (ref 8.9–10.3)
Chloride: 99 mmol/L (ref 98–111)
Creatinine: 1.27 mg/dL — ABNORMAL HIGH (ref 0.44–1.00)
GFR, Est AFR Am: 49 mL/min — ABNORMAL LOW (ref >60)
GFR, Estimated: 42 mL/min — ABNORMAL LOW (ref >60)
Glucose, Bld: 126 mg/dL — ABNORMAL HIGH (ref 70–99)
Potassium: 3.7 mmol/L (ref 3.5–5.1)
Sodium: 140 mmol/L (ref 135–145)
Total Bilirubin: 1 mg/dL (ref 0.3–1.2)
Total Protein: 6.7 g/dL (ref 6.5–8.1)

## 2019-02-03 LAB — MAGNESIUM: Magnesium: 1.7 mg/dL (ref 1.7–2.4)

## 2019-02-03 NOTE — Telephone Encounter (Signed)
Last Visit: 04/02/18 Next Visit: 04/08/19  Okay to refill per Dr. Estanislado Pandy

## 2019-02-03 NOTE — Progress Notes (Signed)
Office Visit Note  Patient: Stephanie Crawford             Date of Birth: 1945-06-13           MRN: [id number]             PCP: [place], [place] Referring: Lanice Shirts, * Visit Date: 02/06/2019 Occupation: @GUAROCC @  Subjective:  Pain in both knee joints     History of Present Illness: Stephanie Crawford is a 73 y.o. female with history of osteoporosis and osteoarthritis.  She reports she was diagnosed with adenocarcinoma stage 2 since her last visit.  She is followed by Dr. Inda Merlin.  She had a partial lobectomy of the right lung and has been undergoing treatment. She completed chemotherapy 01/28/19.  She will be having a follow up chest CT on 02/21/19 to assess the status.  She reports she has noticed some lower extremity muscle deconditioning.  She is having pain in both knee joints.  She denies any joint swelling.  She continues to take a calcium and vitamin D supplement daily.  She does not want to start on any osteoporosis treatments at this time.   Activities of Daily Living:  Patient reports morning stiffness for 0 minutes.   Patient Denies nocturnal pain.  Difficulty dressing/grooming: Denies Difficulty climbing stairs: Denies Difficulty getting out of chair: Denies Difficulty using hands for taps, buttons, cutlery, and/or writing: Denies  Review of Systems  Constitutional: Positive for fatigue.  HENT: Positive for mouth dryness. Negative for mouth sores and nose dryness.   Eyes: Negative for itching and dryness.  Respiratory: Negative for shortness of breath and difficulty breathing.   Cardiovascular: Negative for chest pain and palpitations.  Gastrointestinal: Negative for abdominal pain, blood in stool, constipation and diarrhea.  Endocrine: Negative for increased urination.  Genitourinary: Negative for difficulty urinating and painful urination.  Musculoskeletal: Positive for arthralgias and joint pain. Negative for joint swelling and  morning stiffness.  Skin: Negative for rash.  Allergic/Immunologic: Negative for susceptible to infections.  Neurological: Positive for light-headedness and weakness. Negative for dizziness, numbness, headaches and memory loss.  Hematological: Positive for bruising/bleeding tendency.  Psychiatric/Behavioral: Positive for sleep disturbance. Negative for confusion.    PMFS History:  Patient Active Problem List   Diagnosis Date Noted  . Goals of care, counseling/discussion 11/14/2018  . Adenocarcinoma of right lung, stage 2 ([place]) 10/25/2018  . Encounter for antineoplastic chemotherapy 10/25/2018  . S/P partial lobectomy of lung 10/09/2018  . Primary osteoarthritis of both knees 05/17/2017  . Primary osteoarthritis of both hands 05/17/2017  . DDD (degenerative disc disease), lumbar 05/17/2017  . History of gastroesophageal reflux (GERD) 05/17/2017  . History of shingles 05/17/2017  . History of cellulitis 05/17/2017  . Multiple lung nodules on CT 11/30/2016  . Cough 11/30/2016  . Right knee pain 04/28/2015  . Foot ulcer ([place]) 07/15/2014  . Osteoporosis 01/30/2013  . Vitamin D deficiency 01/13/2013  . Other and unspecified hyperlipidemia 01/06/2013  . Breast thickening 01/06/2013  . Skin lesion of right leg 09/19/2012  . Chronic seasonal allergic rhinitis 09/19/2012  . Elevated bilirubin 07/12/2012  . History of cardiac murmur 07/12/2012  . Rash and nonspecific skin eruption 07/11/2012  . Rectal bleeding 05/11/2011  . Abnormal EKG 03/01/2011  . General medical examination 03/01/2011  . GERD 12/08/2009  . ANKLE EDEMA 12/08/2009    Past Medical History:  Diagnosis Date  . Allergic rhinitis   . Allergy   . Anemia  prior to hysterectomy--1997  . GERD (gastroesophageal reflux disease)   . Heart murmur   . Hemorrhoid 2011  . History of shingles 04/2014  . Nodule of lower lobe of right lung     Family History  Problem Relation Age of Onset  . Prostate cancer Father   .  Lung cancer Maternal Grandmother   . Asthma Daughter   . Asthma Sister   . Breast cancer Brother   . Heart attack Brother    Past Surgical History:  Procedure Laterality Date  . ABDOMINAL HYSTERECTOMY  1997  . APPENDECTOMY  1960  . HEMORRHOID SURGERY  9/11   8/27 had a follow up procedure.    . IR THORACENTESIS ASP PLEURAL SPACE W/IMG GUIDE  10/24/2018  . LIPOMA EXCISION  1975   neck, left breast and righ jaw.  Marland Kitchen VIDEO ASSISTED THORACOSCOPY (VATS)/WEDGE RESECTION Right 10/09/2018   Procedure: VIDEO ASSISTED THORACOSCOPY (VATS)/WEDGE RESECTION;  Surgeon: Melrose Nakayama, MD;  Location: [place];  Service: Thoracic;  Laterality: Right;   Social History   Social History Narrative   [facility] (11/30/16):   Originally from [place], [place]. Grew up primarily in [place], [place]. She has also lived in [place]. She moved to [facility] [place] [facility] in 2006. 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 Recently has acquired an outside dog. No bird exposure. Possible mold problem in her current home with history of water damage. No hot tub exposure. Enjoys babysitting her grandsons. Remote travel to [place], [place], [place], [place], [place], [place], [place], [place], [place], & [place].    Immunization History  Administered Date(s) Administered  . Td 06/22/2009  . Tdap 01/06/2013     Objective: Vital Signs: BP 126/82 (BP Location: Left Arm, Patient Position: Sitting, Cuff Size: Normal)   Pulse (!) 104   Resp 14   Ht 5\' 9"  (1.753 m)   Wt 217 lb 12.8 oz (98.8 kg)   LMP 05/23/1995   BMI 32.16 kg/m    Physical Exam Vitals signs and nursing note reviewed.  Constitutional:      Appearance: She is well-developed.  HENT:     Head: Normocephalic and atraumatic.  Eyes:     Conjunctiva/sclera: Conjunctivae normal.  Neck:     Musculoskeletal: Normal range of motion.   Cardiovascular:     Rate and Rhythm: Normal rate and regular rhythm.     Heart sounds: Normal heart sounds.  Pulmonary:     Effort: Pulmonary effort is normal.     Breath sounds: Normal breath sounds.  Abdominal:     General: Bowel sounds are normal.     Palpations: Abdomen is soft.  Lymphadenopathy:     Cervical: No cervical adenopathy.  Skin:    General: Skin is warm and dry.     Capillary Refill: Capillary refill takes less than 2 seconds.  Neurological:     Mental Status: She is alert and oriented to person, place, and time.  Psychiatric:        Behavior: Behavior normal.      Musculoskeletal Exam: C-spine, thoracic spine, and lumbar spine good ROM.  No midline spinal tenderness.  No SI joint tenderness.  Shoulder joints, elbow joints, wrist joints, MCPs, PIPs, and DIPs good ROM with no synovitis.  Complete fist formation.  Mild PIP and DIP synovial thickening.  Hip joints, knee joints, ankle joints, MTPs, PIPs, and DIPs good ROM with no  synovitis.  No warmth or effusion of knee joints.  No tenderness or swelling of ankle joints.   CDAI Exam: CDAI Score: - Patient Global: -; Provider Global: - Swollen: -; Tender: - Joint Exam   No joint exam has been documented for this visit   There is currently no information documented on the homunculus. Go to the Rheumatology activity and complete the homunculus joint exam.  Investigation: No additional findings.  Imaging: No results found.  Recent Labs: Lab Results  Component Value Date   WBC 3.6 (L) 02/03/2019   HGB 12.6 02/03/2019   PLT 216 02/03/2019   NA 140 02/03/2019   K 3.7 02/03/2019   CL 99 02/03/2019   CO2 31 02/03/2019   GLUCOSE 126 (H) 02/03/2019   BUN 23 02/03/2019   CREATININE 1.27 (H) 02/03/2019   BILITOT 1.0 02/03/2019   ALKPHOS 83 02/03/2019   AST 18 02/03/2019   ALT 13 02/03/2019   PROT 6.7 02/03/2019   ALBUMIN 3.9 02/03/2019   CALCIUM 9.7 02/03/2019   GFRAA 49 (L) 02/03/2019    Speciality  Comments: No specialty comments available.  Procedures:  No procedures performed Allergies: Adhesive [tape]     Assessment / Plan:     Visit Diagnoses: Age-related osteoporosis without current pathological fracture - She is taking calcium 1,200 mg daily and vitamin d 2000 units daily. She was previously treated with Boniva and Fosamax.  She declines osteoporosis treatment at this time. Her most recent DEXA was 07/19/2017 T-score: -2.7 neck right and no comparisons for BMD. Previous DXA 02/03/2015 T -2.6, BMD 0.875.  She will be due to update DEXA in February 2021.  She will follow up yearly.   History of vitamin D deficiency: She takes vitamin D 2000 units by mouth daily.   Primary osteoarthritis of both hands: She has mild PIP and DIP synovial thickening consistent with osteoarthritis of both hands.  She has no tenderness or synovitis.  Complete fist formation bilaterally.  Joint protection and muscle strengthening were discussed.   Primary osteoarthritis of both knees: She presents today with increased pain in both knee joints.  She has good ROM with discomfort bilaterally.  She has been having increased discomfort with ambulation.  She has difficulty climbing steps and stiffness after sitting for prolonged periods of time.  X-rays of both knee joints were updated today.  She has moderate OA and moderate chondromalacia patella. She was given knee joint exercises to perform. She declined a cortisone injection today.  She was advised to notify us if she develops increased joint pain or joint swelling.   Chronic pain of both knees - She presents today with increased pain in both knee joints.  She has good ROM with discomfort bilaterally.  No warmth or effusion of knee joints noted.  She has noticed increased lower extremity weakness due to muscular deconditioning while undergoing chemotherapy.  X-rays of both knee joints were ordered today. She was given a handout of knee exercises to perform.  She  declined a cortisone injection.  Plan: XR KNEE 3 VIEW LEFT, XR KNEE 3 VIEW RIGHT  Trochanteric bursitis of both hips: She has no tenderness on exam today.   DDD (degenerative disc disease), lumbar: She has no lower back pain at this time.   Adenocarcinoma of right lung, stage 2 ([place]): She was diagnosed with adenocarcinoma stage 2.  She had a partial lobectomy of the right lung and has been undergoing chemotherapy.  She completed chemotherapy on 01/28/19. She is followed  by Dr. Inda Merlin.  She has a chest CT on 02/21/19 to assess for response.   S/P partial lobectomy of lung  Other medical conditions are listed as follows:   History of cellulitis  History of gastroesophageal reflux (GERD)  History of shingles  History of cardiac murmur    Orders: Orders Placed This Encounter  Procedures  . XR KNEE 3 VIEW LEFT  . XR KNEE 3 VIEW RIGHT   No orders of the defined types were placed in this encounter.   Face-to-face time spent with patient was 30 minutes. Greater than 50% of time was spent in counseling and coordination of care.  Follow-Up Instructions: Return in about 1 year (around 02/06/2020) for Osteoporosis, Osteoarthritis.   Ofilia Neas, PA-C   I examined and evaluated the patient with Stephanie Sams PA.  Stephanie Crawford was having increased pain and stiffness in her knee joints today.  X-rays reveal moderate osteoarthritis and chondromalacia patella.  We discussed cortisone injection but she declined.  She may consider Visco supplement injections in the future.  She will try exercises for now.  She is a still not interested in starting treatment for osteoporosis.  She is going through chemotherapy for lung cancer and has been very stressful for her.  The plan of care was discussed as noted above.  Bo Merino, MD  Note - This record has been created using Editor, commissioning.  Chart creation errors have been sought, but may not always  have been located. Such creation errors do not  reflect on  the standard of medical care.

## 2019-02-04 ENCOUNTER — Other Ambulatory Visit: Payer: Self-pay | Admitting: Internal Medicine

## 2019-02-06 ENCOUNTER — Encounter: Payer: Self-pay | Admitting: Rheumatology

## 2019-02-06 ENCOUNTER — Ambulatory Visit (INDEPENDENT_AMBULATORY_CARE_PROVIDER_SITE_OTHER): Payer: BC Managed Care – PPO | Admitting: Rheumatology

## 2019-02-06 ENCOUNTER — Ambulatory Visit: Payer: Self-pay

## 2019-02-06 ENCOUNTER — Other Ambulatory Visit: Payer: Self-pay

## 2019-02-06 VITALS — BP 126/82 | HR 104 | Resp 14 | Ht 69.0 in | Wt 217.8 lb

## 2019-02-06 DIAGNOSIS — Z872 Personal history of diseases of the skin and subcutaneous tissue: Secondary | ICD-10-CM

## 2019-02-06 DIAGNOSIS — G8929 Other chronic pain: Secondary | ICD-10-CM

## 2019-02-06 DIAGNOSIS — M19042 Primary osteoarthritis, left hand: Secondary | ICD-10-CM

## 2019-02-06 DIAGNOSIS — M810 Age-related osteoporosis without current pathological fracture: Secondary | ICD-10-CM | POA: Diagnosis not present

## 2019-02-06 DIAGNOSIS — C3491 Malignant neoplasm of unspecified part of right bronchus or lung: Secondary | ICD-10-CM

## 2019-02-06 DIAGNOSIS — M7062 Trochanteric bursitis, left hip: Secondary | ICD-10-CM

## 2019-02-06 DIAGNOSIS — M170 Bilateral primary osteoarthritis of knee: Secondary | ICD-10-CM | POA: Diagnosis not present

## 2019-02-06 DIAGNOSIS — Z8639 Personal history of other endocrine, nutritional and metabolic disease: Secondary | ICD-10-CM | POA: Diagnosis not present

## 2019-02-06 DIAGNOSIS — M19041 Primary osteoarthritis, right hand: Secondary | ICD-10-CM | POA: Diagnosis not present

## 2019-02-06 DIAGNOSIS — Z8619 Personal history of other infectious and parasitic diseases: Secondary | ICD-10-CM

## 2019-02-06 DIAGNOSIS — M5136 Other intervertebral disc degeneration, lumbar region: Secondary | ICD-10-CM

## 2019-02-06 DIAGNOSIS — Z8719 Personal history of other diseases of the digestive system: Secondary | ICD-10-CM

## 2019-02-06 DIAGNOSIS — M25561 Pain in right knee: Secondary | ICD-10-CM

## 2019-02-06 DIAGNOSIS — Z902 Acquired absence of lung [part of]: Secondary | ICD-10-CM

## 2019-02-06 DIAGNOSIS — M51369 Other intervertebral disc degeneration, lumbar region without mention of lumbar back pain or lower extremity pain: Secondary | ICD-10-CM

## 2019-02-06 DIAGNOSIS — M7061 Trochanteric bursitis, right hip: Secondary | ICD-10-CM

## 2019-02-06 DIAGNOSIS — Z8679 Personal history of other diseases of the circulatory system: Secondary | ICD-10-CM

## 2019-02-06 DIAGNOSIS — M25562 Pain in left knee: Secondary | ICD-10-CM

## 2019-02-06 NOTE — Patient Instructions (Signed)
Journal for Nurse Practitioners, 15(4), 263-267. Retrieved February 25, 2018 from http://clinicalkey.com/nursing">  Knee Exercises Ask your health care provider which exercises are safe for you. Do exercises exactly as told by your health care provider and adjust them as directed. It is normal to feel mild stretching, pulling, tightness, or discomfort as you do these exercises. Stop right away if you feel sudden pain or your pain gets worse. Do not begin these exercises until told by your health care provider. Stretching and range-of-motion exercises These exercises warm up your muscles and joints and improve the movement and flexibility of your knee. These exercises also help to relieve pain and swelling. Knee extension, prone 1. Lie on your abdomen (prone position) on a bed. 2. Place your left / right knee just beyond the edge of the surface so your knee is not on the bed. You can put a towel under your left / right thigh just above your kneecap for comfort. 3. Relax your leg muscles and allow gravity to straighten your knee (extension). You should feel a stretch behind your left / right knee. 4. Hold this position for __________ seconds. 5. Scoot up so your knee is supported between repetitions. Repeat __________ times. Complete this exercise __________ times a day. Knee flexion, active  1. Lie on your back with both legs straight. If this causes back discomfort, bend your left / right knee so your foot is flat on the floor. 2. Slowly slide your left / right heel back toward your buttocks. Stop when you feel a gentle stretch in the front of your knee or thigh (flexion). 3. Hold this position for __________ seconds. 4. Slowly slide your left / right heel back to the starting position. Repeat __________ times. Complete this exercise __________ times a day. Quadriceps stretch, prone  1. Lie on your abdomen on a firm surface, such as a bed or padded floor. 2. Bend your left / right knee and hold  your ankle. If you cannot reach your ankle or pant leg, loop a belt around your foot and grab the belt instead. 3. Gently pull your heel toward your buttocks. Your knee should not slide out to the side. You should feel a stretch in the front of your thigh and knee (quadriceps). 4. Hold this position for __________ seconds. Repeat __________ times. Complete this exercise __________ times a day. Hamstring, supine 1. Lie on your back (supine position). 2. Loop a belt or towel over the ball of your left / right foot. The ball of your foot is on the walking surface, right under your toes. 3. Straighten your left / right knee and slowly pull on the belt to raise your leg until you feel a gentle stretch behind your knee (hamstring). ? Do not let your knee bend while you do this. ? Keep your other leg flat on the floor. 4. Hold this position for __________ seconds. Repeat __________ times. Complete this exercise __________ times a day. Strengthening exercises These exercises build strength and endurance in your knee. Endurance is the ability to use your muscles for a long time, even after they get tired. Quadriceps, isometric This exercise stretches the muscles in front of your thigh (quadriceps) without moving your knee joint (isometric). 1. Lie on your back with your left / right leg extended and your other knee bent. Put a rolled towel or small pillow under your knee if told by your health care provider. 2. Slowly tense the muscles in the front of your left /   right thigh. You should see your kneecap slide up toward your hip or see increased dimpling just above the knee. This motion will push the back of the knee toward the floor. 3. For __________ seconds, hold the muscle as tight as you can without increasing your pain. 4. Relax the muscles slowly and completely. Repeat __________ times. Complete this exercise __________ times a day. Straight leg raises This exercise stretches the muscles in front  of your thigh (quadriceps) and the muscles that move your hips (hip flexors). 1. Lie on your back with your left / right leg extended and your other knee bent. 2. Tense the muscles in the front of your left / right thigh. You should see your kneecap slide up or see increased dimpling just above the knee. Your thigh may even shake a bit. 3. Keep these muscles tight as you raise your leg 4-6 inches (10-15 cm) off the floor. Do not let your knee bend. 4. Hold this position for __________ seconds. 5. Keep these muscles tense as you lower your leg. 6. Relax your muscles slowly and completely after each repetition. Repeat __________ times. Complete this exercise __________ times a day. Hamstring, isometric 1. Lie on your back on a firm surface. 2. Bend your left / right knee about __________ degrees. 3. Dig your left / right heel into the surface as if you are trying to pull it toward your buttocks. Tighten the muscles in the back of your thighs (hamstring) to "dig" as hard as you can without increasing any pain. 4. Hold this position for __________ seconds. 5. Release the tension gradually and allow your muscles to relax completely for __________ seconds after each repetition. Repeat __________ times. Complete this exercise __________ times a day. Hamstring curls If told by your health care provider, do this exercise while wearing ankle weights. Begin with __________ lb weights. Then increase the weight by 1 lb (0.5 kg) increments. Do not wear ankle weights that are more than __________ lb. 1. Lie on your abdomen with your legs straight. 2. Bend your left / right knee as far as you can without feeling pain. Keep your hips flat against the floor. 3. Hold this position for __________ seconds. 4. Slowly lower your leg to the starting position. Repeat __________ times. Complete this exercise __________ times a day. Squats This exercise strengthens the muscles in front of your thigh and knee  (quadriceps). 1. Stand in front of a table, with your feet and knees pointing straight ahead. You may rest your hands on the table for balance but not for support. 2. Slowly bend your knees and lower your hips like you are going to sit in a chair. ? Keep your weight over your heels, not over your toes. ? Keep your lower legs upright so they are parallel with the table legs. ? Do not let your hips go lower than your knees. ? Do not bend lower than told by your health care provider. ? If your knee pain increases, do not bend as low. 3. Hold the squat position for __________ seconds. 4. Slowly push with your legs to return to standing. Do not use your hands to pull yourself to standing. Repeat __________ times. Complete this exercise __________ times a day. Wall slides This exercise strengthens the muscles in front of your thigh and knee (quadriceps). 1. Lean your back against a smooth wall or door, and walk your feet out 18-24 inches (46-61 cm) from it. 2. Place your feet hip-width apart. 3.   Slowly slide down the wall or door until your knees bend __________ degrees. Keep your knees over your heels, not over your toes. Keep your knees in line with your hips. 4. Hold this position for __________ seconds. Repeat __________ times. Complete this exercise __________ times a day. Straight leg raises This exercise strengthens the muscles that rotate the leg at the hip and move it away from your body (hip abductors). 1. Lie on your side with your left / right leg in the top position. Lie so your head, shoulder, knee, and hip line up. You may bend your bottom knee to help you keep your balance. 2. Roll your hips slightly forward so your hips are stacked directly over each other and your left / right knee is facing forward. 3. Leading with your heel, lift your top leg 4-6 inches (10-15 cm). You should feel the muscles in your outer hip lifting. ? Do not let your foot drift forward. ? Do not let your knee  roll toward the ceiling. 4. Hold this position for __________ seconds. 5. Slowly return your leg to the starting position. 6. Let your muscles relax completely after each repetition. Repeat __________ times. Complete this exercise __________ times a day. Straight leg raises This exercise stretches the muscles that move your hips away from the front of the pelvis (hip extensors). 1. Lie on your abdomen on a firm surface. You can put a pillow under your hips if that is more comfortable. 2. Tense the muscles in your buttocks and lift your left / right leg about 4-6 inches (10-15 cm). Keep your knee straight as you lift your leg. 3. Hold this position for __________ seconds. 4. Slowly lower your leg to the starting position. 5. Let your leg relax completely after each repetition. Repeat __________ times. Complete this exercise __________ times a day. This information is not intended to replace advice given to you by your health care provider. Make sure you discuss any questions you have with your health care provider. Document Released: 03/22/2005 Document Revised: 02/26/2018 Document Reviewed: 02/26/2018 Elsevier Patient Education  2020 [employer]  

## 2019-02-10 ENCOUNTER — Other Ambulatory Visit: Payer: Self-pay

## 2019-02-10 ENCOUNTER — Inpatient Hospital Stay: Payer: BC Managed Care – PPO

## 2019-02-10 DIAGNOSIS — Z5111 Encounter for antineoplastic chemotherapy: Secondary | ICD-10-CM | POA: Diagnosis not present

## 2019-02-10 DIAGNOSIS — C3491 Malignant neoplasm of unspecified part of right bronchus or lung: Secondary | ICD-10-CM

## 2019-02-10 DIAGNOSIS — R5383 Other fatigue: Secondary | ICD-10-CM | POA: Diagnosis not present

## 2019-02-10 DIAGNOSIS — C3431 Malignant neoplasm of lower lobe, right bronchus or lung: Secondary | ICD-10-CM | POA: Diagnosis not present

## 2019-02-10 LAB — CMP (CANCER CENTER ONLY)
ALT: 13 U/L (ref 0–44)
AST: 17 U/L (ref 15–41)
Albumin: 3.9 g/dL (ref 3.5–5.0)
Alkaline Phosphatase: 85 U/L (ref 38–126)
Anion gap: 10 (ref 5–15)
BUN: 14 mg/dL (ref 8–23)
CO2: 28 mmol/L (ref 22–32)
Calcium: 9.8 mg/dL (ref 8.9–10.3)
Chloride: 103 mmol/L (ref 98–111)
Creatinine: 1.14 mg/dL — ABNORMAL HIGH (ref 0.44–1.00)
GFR, Est AFR Am: 56 mL/min — ABNORMAL LOW (ref >60)
GFR, Estimated: 48 mL/min — ABNORMAL LOW (ref >60)
Glucose, Bld: 106 mg/dL — ABNORMAL HIGH (ref 70–99)
Potassium: 3.8 mmol/L (ref 3.5–5.1)
Sodium: 141 mmol/L (ref 135–145)
Total Bilirubin: 0.7 mg/dL (ref 0.3–1.2)
Total Protein: 6.7 g/dL (ref 6.5–8.1)

## 2019-02-10 LAB — CBC WITH DIFFERENTIAL (CANCER CENTER ONLY)
Abs Immature Granulocytes: 0 10*3/uL (ref 0.00–0.07)
Basophils Absolute: 0 10*3/uL (ref 0.0–0.1)
Basophils Relative: 0 %
Eosinophils Absolute: 0 10*3/uL (ref 0.0–0.5)
Eosinophils Relative: 1 %
HCT: 31.3 % — ABNORMAL LOW (ref 36.0–46.0)
Hemoglobin: 10.7 g/dL — ABNORMAL LOW (ref 12.0–15.0)
Immature Granulocytes: 0 %
Lymphocytes Relative: 60 %
Lymphs Abs: 1.4 10*3/uL (ref 0.7–4.0)
MCH: 32.1 pg (ref 26.0–34.0)
MCHC: 34.2 g/dL (ref 30.0–36.0)
MCV: 94 fL (ref 80.0–100.0)
Monocytes Absolute: 0.3 10*3/uL (ref 0.1–1.0)
Monocytes Relative: 13 %
Neutro Abs: 0.6 10*3/uL — ABNORMAL LOW (ref 1.7–7.7)
Neutrophils Relative %: 26 %
Platelet Count: 178 10*3/uL (ref 150–400)
RBC: 3.33 MIL/uL — ABNORMAL LOW (ref 3.87–5.11)
RDW: 12.8 % (ref 11.5–15.5)
WBC Count: 2.3 10*3/uL — ABNORMAL LOW (ref 4.0–10.5)
nRBC: 0 % (ref 0.0–0.2)

## 2019-02-10 LAB — MAGNESIUM: Magnesium: 1.7 mg/dL (ref 1.7–2.4)

## 2019-02-14 ENCOUNTER — Other Ambulatory Visit: Payer: Self-pay | Admitting: *Deleted

## 2019-02-14 DIAGNOSIS — C3491 Malignant neoplasm of unspecified part of right bronchus or lung: Secondary | ICD-10-CM

## 2019-02-17 ENCOUNTER — Other Ambulatory Visit: Payer: Self-pay

## 2019-02-17 ENCOUNTER — Inpatient Hospital Stay: Payer: BC Managed Care – PPO

## 2019-02-17 DIAGNOSIS — C3491 Malignant neoplasm of unspecified part of right bronchus or lung: Secondary | ICD-10-CM

## 2019-02-17 DIAGNOSIS — R5383 Other fatigue: Secondary | ICD-10-CM | POA: Diagnosis not present

## 2019-02-17 DIAGNOSIS — Z5111 Encounter for antineoplastic chemotherapy: Secondary | ICD-10-CM | POA: Diagnosis not present

## 2019-02-17 DIAGNOSIS — C3431 Malignant neoplasm of lower lobe, right bronchus or lung: Secondary | ICD-10-CM | POA: Diagnosis not present

## 2019-02-17 LAB — CBC WITH DIFFERENTIAL (CANCER CENTER ONLY)
Abs Immature Granulocytes: 0.07 10*3/uL (ref 0.00–0.07)
Basophils Absolute: 0.1 10*3/uL (ref 0.0–0.1)
Basophils Relative: 1 %
Eosinophils Absolute: 0.1 10*3/uL (ref 0.0–0.5)
Eosinophils Relative: 2 %
HCT: 32.1 % — ABNORMAL LOW (ref 36.0–46.0)
Hemoglobin: 11 g/dL — ABNORMAL LOW (ref 12.0–15.0)
Immature Granulocytes: 2 %
Lymphocytes Relative: 45 %
Lymphs Abs: 1.7 10*3/uL (ref 0.7–4.0)
MCH: 33 pg (ref 26.0–34.0)
MCHC: 34.3 g/dL (ref 30.0–36.0)
MCV: 96.4 fL (ref 80.0–100.0)
Monocytes Absolute: 0.8 10*3/uL (ref 0.1–1.0)
Monocytes Relative: 22 %
Neutro Abs: 1 10*3/uL — ABNORMAL LOW (ref 1.7–7.7)
Neutrophils Relative %: 28 %
Platelet Count: 422 10*3/uL — ABNORMAL HIGH (ref 150–400)
RBC: 3.33 MIL/uL — ABNORMAL LOW (ref 3.87–5.11)
RDW: 13.8 % (ref 11.5–15.5)
WBC Count: 3.7 10*3/uL — ABNORMAL LOW (ref 4.0–10.5)
nRBC: 0 % (ref 0.0–0.2)

## 2019-02-17 LAB — CMP (CANCER CENTER ONLY)
ALT: 11 U/L (ref 0–44)
AST: 19 U/L (ref 15–41)
Albumin: 4 g/dL (ref 3.5–5.0)
Alkaline Phosphatase: 77 U/L (ref 38–126)
Anion gap: 8 (ref 5–15)
BUN: 13 mg/dL (ref 8–23)
CO2: 29 mmol/L (ref 22–32)
Calcium: 10 mg/dL (ref 8.9–10.3)
Chloride: 104 mmol/L (ref 98–111)
Creatinine: 1.14 mg/dL — ABNORMAL HIGH (ref 0.44–1.00)
GFR, Est AFR Am: 56 mL/min — ABNORMAL LOW (ref >60)
GFR, Estimated: 48 mL/min — ABNORMAL LOW (ref >60)
Glucose, Bld: 76 mg/dL (ref 70–99)
Potassium: 4.1 mmol/L (ref 3.5–5.1)
Sodium: 141 mmol/L (ref 135–145)
Total Bilirubin: 0.4 mg/dL (ref 0.3–1.2)
Total Protein: 6.5 g/dL (ref 6.5–8.1)

## 2019-02-21 ENCOUNTER — Ambulatory Visit (HOSPITAL_COMMUNITY)
Admission: RE | Admit: 2019-02-21 | Discharge: 2019-02-21 | Disposition: A | Discharge status: Home / Self Care | Payer: BC Managed Care – PPO | Source: Ambulatory Visit | Attending: Physician Assistant | Admitting: Physician Assistant

## 2019-02-21 ENCOUNTER — Other Ambulatory Visit: Payer: Self-pay

## 2019-02-21 DIAGNOSIS — C3491 Malignant neoplasm of unspecified part of right bronchus or lung: Secondary | ICD-10-CM | POA: Diagnosis not present

## 2019-02-21 MED ORDER — SODIUM CHLORIDE (PF) 0.9 % IJ SOLN
INTRAMUSCULAR | Status: AC
  Filled 2019-02-21: qty 50

## 2019-02-21 MED ORDER — IOHEXOL 300 MG/ML  SOLN
75.0000 mL | Freq: Once | INTRAMUSCULAR | Status: AC | PRN
  Administered 2019-02-21: 75 mL via INTRAVENOUS

## 2019-02-24 ENCOUNTER — Telehealth: Payer: Self-pay | Admitting: Internal Medicine

## 2019-02-24 ENCOUNTER — Encounter: Payer: Self-pay | Admitting: Internal Medicine

## 2019-02-24 ENCOUNTER — Other Ambulatory Visit: Payer: Self-pay

## 2019-02-24 ENCOUNTER — Inpatient Hospital Stay: Payer: BC Managed Care – PPO | Attending: Internal Medicine | Admitting: Internal Medicine

## 2019-02-24 VITALS — BP 148/90 | HR 94 | Temp 97.8°F | Resp 18 | Ht 69.0 in | Wt 215.5 lb

## 2019-02-24 DIAGNOSIS — C3431 Malignant neoplasm of lower lobe, right bronchus or lung: Secondary | ICD-10-CM | POA: Diagnosis not present

## 2019-02-24 DIAGNOSIS — C3490 Malignant neoplasm of unspecified part of unspecified bronchus or lung: Secondary | ICD-10-CM | POA: Diagnosis not present

## 2019-02-24 DIAGNOSIS — E042 Nontoxic multinodular goiter: Secondary | ICD-10-CM | POA: Diagnosis not present

## 2019-02-24 DIAGNOSIS — I10 Essential (primary) hypertension: Secondary | ICD-10-CM | POA: Insufficient documentation

## 2019-02-24 DIAGNOSIS — C3491 Malignant neoplasm of unspecified part of right bronchus or lung: Secondary | ICD-10-CM | POA: Diagnosis not present

## 2019-02-24 NOTE — Telephone Encounter (Signed)
Gave avs and calendar ° °

## 2019-02-24 NOTE — Progress Notes (Signed)
[place] Telephone:([phone]) [phone]   Fax:([phone]) [phone]  OFFICE PROGRESS NOTE  [place], [facility] [address] [place] [id number]  DIAGNOSIS: Stage IIB (T3, N0, M0) non-small cell lung cancer, adenocarcinoma diagnosed in May 2020.  Biomarker Findings Microsatellite status - Cannot Be Determined Tumor Mutational Burden - Cannot Be Determined Genomic Findings For a complete list of the genes assayed, please refer to the Appendix. KRAS G12V TP53 G244V 7 Disease relevant genes with no reportable alterations: ALK, BRAF, EGFR, ERBB2, MET, RET, ROS1   PDL 1 expression: negative   PRIOR THERAPY:  1) Status post wedge resection x3 of the right lower lobe with lymph node sampling under the care of Dr. Roxan Hockey on 10/09/2018. 2) Adjuvant systemic chemotherapy with cisplatin 75 mg/M2 and Alimta 500 mg/M2 every 3 weeks.  First dose November 22, 2018.  Status post 4 cycles.  Last dose was given on 01/28/2019.  CURRENT THERAPY: Observation.   INTERVAL HISTORY: Stephanie Crawford 73 y.o. female returns to the clinic today for follow-up visit.  The patient is feeling fine today with no concerning complaints except for feeling cold especially at nighttime.  She also has some mild bruises in the left calf.  She denied having any chest pain, shortness of breath, cough or hemoptysis.  She denied having any fever or chills.  She has no nausea, vomiting, diarrhea or constipation.  She has no headache or visual changes.  She tolerated the previous 4 cycles of adjuvant chemotherapy fairly well.  The patient is here today for evaluation with repeat CT scan of the chest for restaging of her disease.  MEDICAL HISTORY: Past Medical History:  Diagnosis Date  . Allergic rhinitis   . Allergy   . Anemia    prior to hysterectomy--1997  . GERD (gastroesophageal reflux disease)   . Heart murmur   . Hemorrhoid 2011  . History of shingles 04/2014  .  Nodule of lower lobe of right lung     ALLERGIES:  is allergic to adhesive [tape].  MEDICATIONS:  Current Outpatient Medications  Medication Sig Dispense Refill  . aspirin 325 MG EC tablet Take 650 mg by mouth daily as needed for pain.    Marland Kitchen azelastine (OPTIVAR) 0.05 % ophthalmic solution Place 1 drop into both eyes daily as needed (irritation).    . calcium carbonate (OS-CAL) 600 MG TABS tablet Take 1,200 mg by mouth every evening.     . Cholecalciferol (VITAMIN D) 50 MCG (2000 UT) tablet Take 2,000 Units by mouth at bedtime.    . diclofenac sodium (VOLTAREN) 1 % GEL APPLY 2-4 GRAMS TO AFFECTED JOINT UP TO 4 TIMES DAILY. 677 g 2  . folic acid (FOLVITE) 1 MG tablet Take 1 tablet (1 mg total) by mouth daily. 30 tablet 4  . Omega-3 1000 MG CAPS Take 1,000 mg by mouth at bedtime.    . prochlorperazine (COMPAZINE) 10 MG tablet TAKE 1 TABLET (10 MG TOTAL) BY MOUTH EVERY 6 (SIX) HOURS AS NEEDED FOR NAUSEA OR VOMITING. 30 tablet 0   No current facility-administered medications for this visit.     SURGICAL HISTORY:  Past Surgical History:  Procedure Laterality Date  . ABDOMINAL HYSTERECTOMY  1997  . APPENDECTOMY  1960  . HEMORRHOID SURGERY  9/11   8/27 had a follow up procedure.    . IR THORACENTESIS ASP PLEURAL SPACE W/IMG GUIDE  10/24/2018  . LIPOMA EXCISION  1975   neck,  left breast and righ jaw.  Marland Kitchen VIDEO ASSISTED THORACOSCOPY (VATS)/WEDGE RESECTION Right 10/09/2018   Procedure: VIDEO ASSISTED THORACOSCOPY (VATS)/WEDGE RESECTION;  Surgeon: Melrose Nakayama, MD;  Location: [place];  Service: Thoracic;  Laterality: Right;    REVIEW OF SYSTEMS:  Constitutional: positive for fatigue Eyes: negative Ears, nose, mouth, throat, and face: negative Respiratory: negative Cardiovascular: negative Gastrointestinal: negative Genitourinary:negative Integument/breast: negative Hematologic/lymphatic: negative Musculoskeletal:negative Neurological: negative Behavioral/Psych: negative Endocrine:  negative Allergic/Immunologic: negative   PHYSICAL EXAMINATION: General appearance: alert, cooperative and no distress Head: Normocephalic, without obvious abnormality, atraumatic Neck: no adenopathy, no JVD, supple, symmetrical, trachea midline and thyroid not enlarged, symmetric, no tenderness/mass/nodules Lymph nodes: Cervical, supraclavicular, and axillary nodes normal. Resp: clear to auscultation bilaterally Back: symmetric, no curvature. ROM normal. No CVA tenderness. Cardio: regular rate and rhythm, S1, S2 normal, no murmur, click, rub or gallop GI: soft, non-tender; bowel sounds normal; no masses,  no organomegaly Extremities: extremities normal, atraumatic, no cyanosis or edema Neurologic: Alert and oriented X 3, normal strength and tone. Normal symmetric reflexes. Normal coordination and gait  ECOG PERFORMANCE STATUS: 1 - Symptomatic but completely ambulatory  Blood pressure (!) 148/90, pulse 94, temperature 97.8 F (36.6 C), resp. rate 18, height '5\' 9"'  (1.753 m), weight 215 lb 8 oz (97.8 kg), last menstrual period 05/23/1995, SpO2 100 %.  LABORATORY DATA: Lab Results  Component Value Date   WBC 3.7 (L) 02/17/2019   HGB 11.0 (L) 02/17/2019   HCT 32.1 (L) 02/17/2019   MCV 96.4 02/17/2019   PLT 422 (H) 02/17/2019      Chemistry      Component Value Date/Time   NA 141 02/17/2019 1118   K 4.1 02/17/2019 1118   CL 104 02/17/2019 1118   CO2 29 02/17/2019 1118   BUN 13 02/17/2019 1118   CREATININE 1.14 (H) 02/17/2019 1118   CREATININE 0.75 07/14/2014 1231      Component Value Date/Time   CALCIUM 10.0 02/17/2019 1118   CALCIUM 10.1 12/28/2009 2231   ALKPHOS 77 02/17/2019 1118   AST 19 02/17/2019 1118   ALT 11 02/17/2019 1118   BILITOT 0.4 02/17/2019 1118       RADIOGRAPHIC STUDIES: Ct Chest W Contrast  Result Date: 02/21/2019 CLINICAL DATA:  Lung cancer restaging EXAM: CT CHEST WITH CONTRAST TECHNIQUE: Multidetector CT imaging of the chest was performed during  intravenous contrast administration. CONTRAST:  37m OMNIPAQUE IOHEXOL 300 MG/ML  SOLN COMPARISON:  PET-CT, 08/15/2018, CT chest, 07/31/2018, 07/26/2017, 02/05/2017 FINDINGS: Cardiovascular: No significant vascular findings. Normal heart size. Scattered coronary artery calcifications. No pericardial effusion. Mediastinum/Nodes: No enlarged mediastinal, hilar, or axillary lymph nodes. Unchanged small hypodense thyroid nodules. Trachea, and esophagus demonstrate no significant findings. Lungs/Pleura: Interval postoperative findings of multiple dependent right lower lobe wedge resections with associated bandlike atelectasis or scarring. There are multiple small pulmonary nodules bilaterally, although somewhat difficult to corroborate with comparison examination in the right lung due to postoperative architectural distortion, appear generally unchanged and benign. No pleural effusion or pneumothorax. Upper Abdomen: No acute abnormality. Musculoskeletal: No chest wall mass or suspicious bone lesions identified. IMPRESSION: 1. Interval postoperative findings of multiple dependent right lower lobe wedge resections with associated bandlike atelectasis or scarring. This examination serves as postoperative baseline. 2. There are multiple small pulmonary nodules bilaterally, although somewhat difficult to corroborate with comparison examination in the right lung due to postoperative architectural distortion, appear generally unchanged and benign, in keeping with multiple prior comparisons. 3.  Coronary artery disease. Electronically Signed  By: Eddie Candle M.D.   On: 02/21/2019 16:18   Xr Knee 3 View Left  Result Date: 02/06/2019 Moderate medial compartment narrowing and moderate patellofemoral narrowing was noted.  No chondrocalcinosis was noted. 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  Xr Knee 3 View Right  Result Date: 02/06/2019  Moderate medial compartment narrowing and moderate patellofemoral narrowing was noted.  No chondrocalcinosis was noted. 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   ASSESSMENT AND PLAN: This is a very pleasant 73 years old African-American female with stage IIb (T3, N0, M0) non-small cell lung cancer, adenocarcinoma with multifocal disease in the right lower lobe status post wedge resection x3 of the right lower lobe with lymph node sampling in May 2020. The patient has no actionable mutations and she has negative PDL 1 expression. She underwent 4 cycles of adjuvant systemic chemotherapy with cisplatin and Alimta.  She tolerated her treatment well with no concerning complaints except for fatigue. She had repeat CT scan of the chest performed recently.  I personally and independently reviewed the scans and discussed the results with the patient today. Her scan showed no concerning findings for disease recurrence but she continues to have few groundglass opacities that need close observation on upcoming imaging studies. I recommended for the patient to continue on observation for now with repeat CT scan of the chest in 4 months. For hypertension, she was advised to take her blood pressure medication as prescribed and to monitor it closely at home. The patient would like to go back to work and she can resume her work schedule in 1-2 weeks. She was advised to call immediately if she has any concerning symptoms in the interval. The patient voices understanding of current disease status and treatment options and is in agreement with the current care plan. All questions were answered. The patient knows to call the clinic with any problems, questions or concerns. We can certainly see the patient much sooner if necessary.  Disclaimer: This note was dictated with voice recognition software. Similar sounding words can inadvertently be  transcribed and may not be corrected upon review.

## 2019-04-07 ENCOUNTER — Other Ambulatory Visit: Payer: Self-pay | Admitting: Internal Medicine

## 2019-04-08 ENCOUNTER — Ambulatory Visit: Payer: Self-pay | Admitting: Rheumatology

## 2019-04-10 DIAGNOSIS — L0291 Cutaneous abscess, unspecified: Secondary | ICD-10-CM | POA: Diagnosis not present

## 2019-04-18 ENCOUNTER — Emergency Department (HOSPITAL_BASED_OUTPATIENT_CLINIC_OR_DEPARTMENT_OTHER)
Admission: EM | Admit: 2019-04-18 | Discharge: 2019-04-18 | Disposition: A | Discharge status: Home / Self Care | Payer: BC Managed Care – PPO | Attending: Emergency Medicine | Admitting: Emergency Medicine

## 2019-04-18 ENCOUNTER — Encounter (HOSPITAL_BASED_OUTPATIENT_CLINIC_OR_DEPARTMENT_OTHER): Payer: Self-pay | Admitting: *Deleted

## 2019-04-18 ENCOUNTER — Other Ambulatory Visit: Payer: Self-pay

## 2019-04-18 ENCOUNTER — Telehealth: Payer: Self-pay | Admitting: Medical Oncology

## 2019-04-18 DIAGNOSIS — Z85118 Personal history of other malignant neoplasm of bronchus and lung: Secondary | ICD-10-CM | POA: Insufficient documentation

## 2019-04-18 DIAGNOSIS — L0100 Impetigo, unspecified: Secondary | ICD-10-CM | POA: Insufficient documentation

## 2019-04-18 DIAGNOSIS — Z9221 Personal history of antineoplastic chemotherapy: Secondary | ICD-10-CM | POA: Diagnosis not present

## 2019-04-18 DIAGNOSIS — Z79899 Other long term (current) drug therapy: Secondary | ICD-10-CM | POA: Diagnosis not present

## 2019-04-18 DIAGNOSIS — Z7982 Long term (current) use of aspirin: Secondary | ICD-10-CM | POA: Insufficient documentation

## 2019-04-18 DIAGNOSIS — R21 Rash and other nonspecific skin eruption: Secondary | ICD-10-CM | POA: Diagnosis not present

## 2019-04-18 HISTORY — DX: Malignant neoplasm of unspecified part of unspecified bronchus or lung: C34.90

## 2019-04-18 MED ORDER — MUPIROCIN CALCIUM 2 % EX CREA: 1.0000 "application " | TOPICAL_CREAM | Freq: Three times a day (TID) | CUTANEOUS | 0 refills | Status: DC

## 2019-04-18 NOTE — ED Provider Notes (Signed)
[facility] EMER[facility]ENCY DEPARTMENT Provider Note   CSN: [id number] Arrival date & time: 04/18/19  1644     History   Chief Complaint Chief Complaint  Patient presents with  . Wound Check    HPI Stephanie Crawford is a 73 y.o. female.     Patient is a 73 year old female who presents with a wound check.  She says she fell about 2 weeks ago and had a minor abrasion over her right elbow.  She went to urgent care last week because it was getting more red and she was started on Bactrim.  She completed a course of Bactrim but she says now she has blisters that are forming around the wound that are spreading.  The blisters have been going on for the last week.  She denies any fevers.  She says that it is throbbing a little bit and a little itchy but not really painful.  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     Past Medical History:  Diagnosis Date  . Allergic rhinitis   . Allergy   . Anemia    prior to hysterectomy--1997  . GERD (gastroesophageal reflux disease)   . Heart murmur   . Hemorrhoid 2011  . History of shingles 04/2014  . Lung cancer ([place])   . Nodule of lower lobe of right lung     Patient Active Problem List   Diagnosis Date Noted  . Hypertension 02/24/2019  . Goals of care, counseling/discussion 11/14/2018  . Adenocarcinoma of right lung, stage 2 ([place]) 10/25/2018  . Encounter for antineoplastic chemotherapy 10/25/2018  . S/P partial lobectomy of lung 10/09/2018  . Primary osteoarthritis of both knees 05/17/2017  . Primary osteoarthritis of both hands 05/17/2017  . DDD (degenerative disc disease), lumbar 05/17/2017  . History of gastroesophageal reflux (GERD) 05/17/2017  . History of shingles 05/17/2017  . History of cellulitis 05/17/2017  . Multiple lung nodules on CT 11/30/2016  . Cough 11/30/2016  . Right knee pain 04/28/2015  . Foot ulcer ([place]) 07/15/2014  . Osteoporosis 01/30/2013  . Vitamin D deficiency 01/13/2013  . Other and  unspecified hyperlipidemia 01/06/2013  . Breast thickening 01/06/2013  . Skin lesion of right leg 09/19/2012  . Chronic seasonal allergic rhinitis 09/19/2012  . Elevated bilirubin 07/12/2012  . History of cardiac murmur 07/12/2012  . Rash and nonspecific skin eruption 07/11/2012  . Rectal bleeding 05/11/2011  . Abnormal EKG 03/01/2011  . General medical examination 03/01/2011  . GERD 12/08/2009  . ANKLE EDEMA 12/08/2009    Past Surgical History:  Procedure Laterality Date  . ABDOMINAL HYSTERECTOMY  1997  . APPENDECTOMY  1960  . HEMORRHOID SURGERY  9/11   8/27 had a follow up procedure.    . IR THORACENTESIS ASP PLEURAL SPACE W/IMG GUIDE  10/24/2018  . LIPOMA EXCISION  1975   neck, left breast and righ jaw.  Marland Kitchen VIDEO ASSISTED THORACOSCOPY (VATS)/WEDGE RESECTION Right 10/09/2018   Procedure: VIDEO ASSISTED THORACOSCOPY (VATS)/WEDGE RESECTION;  Surgeon: Melrose Nakayama, MD;  Location: [facility];  Service: Thoracic;  Laterality: Right;     OB History   No obstetric history on file.      Home Medications    Prior to Admission medications   Medication Sig Start Date End Date Taking? Authorizing Provider  aspirin 325 MG EC tablet Take 650 mg by mouth daily as needed for pain.    [provider]  azelastine (OPTIVAR) 0.05 % ophthalmic solution Place 1 drop into both  eyes daily as needed (irritation).    [provider]  calcium carbonate (OS-CAL) 600 MG TABS tablet Take 1,200 mg by mouth every evening.     [provider]  Cholecalciferol (VITAMIN D) 50 MCG (2000 UT) tablet Take 2,000 Units by mouth at bedtime.    [provider]  diclofenac sodium (VOLTAREN) 1 % GEL APPLY 2-4 GRAMS TO AFFECTED JOINT UP TO 4 TIMES DAILY. 02/03/19   Bo Merino, MD  folic acid (FOLVITE) 1 MG tablet TAKE 1 TABLET BY MOUTH EVERY DAY 04/07/19   Curt Bears, MD  mupirocin cream (BACTROBAN) 2 % Apply 1 application topically 3 (three) times daily. 04/18/19    Malvin Johns, MD  Omega-3 1000 MG CAPS Take 1,000 mg by mouth at bedtime.    [provider]  prochlorperazine (COMPAZINE) 10 MG tablet TAKE 1 TABLET (10 MG TOTAL) BY MOUTH EVERY 6 (SIX) HOURS AS NEEDED FOR NAUSEA OR VOMITING. 02/04/19   Curt Bears, MD    Family History Family History  Problem Relation Age of Onset  . Prostate cancer Father   . Lung cancer Maternal Grandmother   . Asthma Daughter   . Asthma Sister   . Breast cancer Brother   . Heart attack Brother     Social History Social History   Tobacco Use  . Smoking status: Never Smoker  . Smokeless tobacco: Never Used  Substance Use Topics  . Alcohol use: No    Alcohol/week: 0.0 standard drinks  . Drug use: No     Allergies   Adhesive [tape]   Review of Systems Review of Systems  Constitutional: Negative for fever.  Gastrointestinal: Negative for nausea and vomiting.  Musculoskeletal: Negative for arthralgias, back pain, joint swelling and neck pain.  Skin: Positive for rash and wound.  Neurological: Negative for weakness, numbness and headaches.     Physical Exam Updated Vital Signs BP (!) 141/83 (BP Location: Right Arm)   Pulse 95   Temp 98.7 F (37.1 C) (Oral)   Resp 19   Ht 5\' 9"  (1.753 m)   Wt 97.5 kg   LMP 05/23/1995   SpO2 99%   BMI 31.75 kg/m   Physical Exam Constitutional:      Appearance: She is well-developed.  HENT:     Head: Normocephalic and atraumatic.  Neck:     Musculoskeletal: Normal range of motion and neck supple.  Cardiovascular:     Rate and Rhythm: Normal rate.  Pulmonary:     Effort: Pulmonary effort is normal.  Musculoskeletal:        General: No tenderness.     Comments: Patient has a healing abrasion to her right elbow.  There is overlying blisters that are surrounding the wound and spreading out.  Some of them are crusted.  There is no significant erythema to the area.  No significant pain to the area.  The blisters are filled with clear fluid.   No purulent drainage is noted.  Skin:    General: Skin is warm and dry.  Neurological:     Mental Status: She is alert and oriented to person, place, and time.        ED Treatments / Results  Labs (all labs ordered are listed, but only abnormal results are displayed) Labs Reviewed - No data to display  EKG None  Radiology No results found.  Procedures Procedures (including critical care time)  Medications Ordered in ED Medications - No data to display   Initial Impression /  Assessment and Plan / ED Course  I have reviewed the triage vital signs and the nursing notes.  Pertinent labs & imaging results that were available during my care of the patient were reviewed by me and considered in my medical decision making (see chart for details).        Presents with a vesicular rash that has formed over a healing abrasion.  It does not appear to be consistent with shingles.  It could be an inflammatory reaction however given her history, will treat her for possible impetigo with Bactroban ointment.  I encouraged her to follow-up with her PCP if her symptoms are not improving or return here as needed for new worsening symptoms.  Final Clinical Impressions(s) / ED Diagnoses   Final diagnoses:  Impetigo    ED Discharge Orders         Ordered    mupirocin cream (BACTROBAN) 2 %  3 times daily     04/18/19 1755           Malvin Johns, MD 04/18/19 1759

## 2019-04-18 NOTE — Telephone Encounter (Signed)
Pt seeking advice-Lesion on arm is not any better with treatment given last week. I spoke to pt and told her to return to provider or urgent care who treated this last week.

## 2019-04-18 NOTE — ED Triage Notes (Signed)
Pt states she fell 2 weeks ago and has a wound to right elbow. She went to Urgent care last week and completed course of Bactrim. States Sx are worse than before she took medications. States she finished chemo on Sept 8

## 2019-04-25 ENCOUNTER — Telehealth: Payer: Self-pay | Admitting: Internal Medicine

## 2019-04-25 NOTE — Telephone Encounter (Signed)
Patient returned phone call regarding voicemail that was left, rescheduled 2/02 appointment time per patient's request.

## 2019-04-25 NOTE — Telephone Encounter (Signed)
Returned patient's phone call regarding rescheduling an appointment, left a voicemail. 

## 2019-04-30 ENCOUNTER — Encounter: Payer: Self-pay | Admitting: *Deleted

## 2019-05-02 DIAGNOSIS — R21 Rash and other nonspecific skin eruption: Secondary | ICD-10-CM | POA: Diagnosis not present

## 2019-05-02 DIAGNOSIS — C3491 Malignant neoplasm of unspecified part of right bronchus or lung: Secondary | ICD-10-CM | POA: Diagnosis not present

## 2019-05-02 DIAGNOSIS — R6889 Other general symptoms and signs: Secondary | ICD-10-CM | POA: Diagnosis not present

## 2019-05-02 DIAGNOSIS — L089 Local infection of the skin and subcutaneous tissue, unspecified: Secondary | ICD-10-CM | POA: Diagnosis not present

## 2019-05-08 DIAGNOSIS — L905 Scar conditions and fibrosis of skin: Secondary | ICD-10-CM | POA: Diagnosis not present

## 2019-05-08 DIAGNOSIS — A499 Bacterial infection, unspecified: Secondary | ICD-10-CM | POA: Diagnosis not present

## 2019-05-08 DIAGNOSIS — L700 Acne vulgaris: Secondary | ICD-10-CM | POA: Diagnosis not present

## 2019-05-10 ENCOUNTER — Other Ambulatory Visit: Payer: Self-pay | Admitting: Rheumatology

## 2019-05-12 NOTE — Telephone Encounter (Signed)
Last Visit: 02/06/2019 Next Visit:02/05/2020  Okay to refill per Dr. Estanislado Pandy.

## 2019-05-21 ENCOUNTER — Encounter: Payer: Self-pay | Admitting: *Deleted

## 2019-05-28 ENCOUNTER — Telehealth: Payer: Self-pay | Admitting: Internal Medicine

## 2019-05-28 NOTE — Telephone Encounter (Signed)
Returned patient's phone call regarding rescheduling lab appointment, lab appointment rescheduled. Left a voicemail.

## 2019-06-24 ENCOUNTER — Ambulatory Visit (HOSPITAL_COMMUNITY)
Admission: RE | Admit: 2019-06-24 | Discharge: 2019-06-24 | Disposition: A | Discharge status: Home / Self Care | Payer: BC Managed Care – PPO | Source: Ambulatory Visit | Attending: Internal Medicine | Admitting: Internal Medicine

## 2019-06-24 ENCOUNTER — Other Ambulatory Visit: Payer: BC Managed Care – PPO

## 2019-06-24 ENCOUNTER — Other Ambulatory Visit: Payer: Self-pay

## 2019-06-24 ENCOUNTER — Inpatient Hospital Stay: Payer: BC Managed Care – PPO | Attending: Internal Medicine

## 2019-06-24 ENCOUNTER — Encounter (HOSPITAL_COMMUNITY): Payer: Self-pay

## 2019-06-24 DIAGNOSIS — C3431 Malignant neoplasm of lower lobe, right bronchus or lung: Secondary | ICD-10-CM | POA: Insufficient documentation

## 2019-06-24 DIAGNOSIS — C3490 Malignant neoplasm of unspecified part of unspecified bronchus or lung: Secondary | ICD-10-CM | POA: Insufficient documentation

## 2019-06-24 DIAGNOSIS — G629 Polyneuropathy, unspecified: Secondary | ICD-10-CM | POA: Insufficient documentation

## 2019-06-24 DIAGNOSIS — I10 Essential (primary) hypertension: Secondary | ICD-10-CM | POA: Insufficient documentation

## 2019-06-24 DIAGNOSIS — J984 Other disorders of lung: Secondary | ICD-10-CM | POA: Diagnosis not present

## 2019-06-24 LAB — CMP (CANCER CENTER ONLY)
ALT: 19 U/L (ref 0–44)
AST: 21 U/L (ref 15–41)
Albumin: 4.1 g/dL (ref 3.5–5.0)
Alkaline Phosphatase: 86 U/L (ref 38–126)
Anion gap: 9 (ref 5–15)
BUN: 15 mg/dL (ref 8–23)
CO2: 28 mmol/L (ref 22–32)
Calcium: 9.4 mg/dL (ref 8.9–10.3)
Chloride: 105 mmol/L (ref 98–111)
Creatinine: 0.99 mg/dL (ref 0.44–1.00)
GFR, Est AFR Am: >60 mL/min (ref >60)
GFR, Estimated: 57 mL/min — ABNORMAL LOW (ref >60)
Glucose, Bld: 91 mg/dL (ref 70–99)
Potassium: 4 mmol/L (ref 3.5–5.1)
Sodium: 142 mmol/L (ref 135–145)
Total Bilirubin: 0.9 mg/dL (ref 0.3–1.2)
Total Protein: 6.8 g/dL (ref 6.5–8.1)

## 2019-06-24 LAB — CBC WITH DIFFERENTIAL (CANCER CENTER ONLY)
Abs Immature Granulocytes: 0.02 10*3/uL (ref 0.00–0.07)
Basophils Absolute: 0.1 10*3/uL (ref 0.0–0.1)
Basophils Relative: 1 %
Eosinophils Absolute: 0.2 10*3/uL (ref 0.0–0.5)
Eosinophils Relative: 3 %
HCT: 37.7 % (ref 36.0–46.0)
Hemoglobin: 12.6 g/dL (ref 12.0–15.0)
Immature Granulocytes: 0 %
Lymphocytes Relative: 47 %
Lymphs Abs: 2.8 10*3/uL (ref 0.7–4.0)
MCH: 30.8 pg (ref 26.0–34.0)
MCHC: 33.4 g/dL (ref 30.0–36.0)
MCV: 92.2 fL (ref 80.0–100.0)
Monocytes Absolute: 0.6 10*3/uL (ref 0.1–1.0)
Monocytes Relative: 11 %
Neutro Abs: 2.3 10*3/uL (ref 1.7–7.7)
Neutrophils Relative %: 38 %
Platelet Count: 229 10*3/uL (ref 150–400)
RBC: 4.09 MIL/uL (ref 3.87–5.11)
RDW: 12.1 % (ref 11.5–15.5)
WBC Count: 6 10*3/uL (ref 4.0–10.5)
nRBC: 0 % (ref 0.0–0.2)

## 2019-06-24 MED ORDER — SODIUM CHLORIDE (PF) 0.9 % IJ SOLN
INTRAMUSCULAR | Status: AC
  Filled 2019-06-24: qty 50

## 2019-06-24 MED ORDER — IOHEXOL 300 MG/ML  SOLN
75.0000 mL | Freq: Once | INTRAMUSCULAR | Status: AC | PRN
  Administered 2019-06-24: 75 mL via INTRAVENOUS

## 2019-06-25 ENCOUNTER — Other Ambulatory Visit: Payer: Self-pay

## 2019-06-25 ENCOUNTER — Encounter: Payer: Self-pay | Admitting: Internal Medicine

## 2019-06-25 ENCOUNTER — Inpatient Hospital Stay (HOSPITAL_BASED_OUTPATIENT_CLINIC_OR_DEPARTMENT_OTHER): Payer: BC Managed Care – PPO | Admitting: Internal Medicine

## 2019-06-25 VITALS — BP 145/87 | HR 85 | Temp 98.5°F | Resp 18 | Ht 69.0 in | Wt 221.5 lb

## 2019-06-25 DIAGNOSIS — I10 Essential (primary) hypertension: Secondary | ICD-10-CM | POA: Diagnosis not present

## 2019-06-25 DIAGNOSIS — C3491 Malignant neoplasm of unspecified part of right bronchus or lung: Secondary | ICD-10-CM

## 2019-06-25 DIAGNOSIS — G629 Polyneuropathy, unspecified: Secondary | ICD-10-CM | POA: Diagnosis not present

## 2019-06-25 DIAGNOSIS — C3431 Malignant neoplasm of lower lobe, right bronchus or lung: Secondary | ICD-10-CM | POA: Diagnosis not present

## 2019-06-25 DIAGNOSIS — C3490 Malignant neoplasm of unspecified part of unspecified bronchus or lung: Secondary | ICD-10-CM | POA: Diagnosis not present

## 2019-06-25 MED ORDER — GABAPENTIN 100 MG PO CAPS: 100.0000 mg | ORAL_CAPSULE | Freq: Three times a day (TID) | ORAL | 2 refills | Status: DC

## 2019-06-25 NOTE — Progress Notes (Signed)
[facility] [phone]   Fax:([phone]  OFFICE PROGRESS NOTE  [facility], [facility] [address] [place] [address]  DIAGNOSIS: Stage IIB (T3, N0, M0) non-small cell lung cancer, adenocarcinoma diagnosed in May 2020.  Biomarker Findings Microsatellite status - Cannot Be Determined Tumor Mutational Burden - Cannot Be Determined Genomic Findings For a complete list of the genes assayed, please refer to the Appendix. KRAS G12V TP53 G244V 7 Disease relevant genes with no reportable alterations: ALK, BRAF, EGFR, ERBB2, MET, RET, ROS1   PDL 1 expression: negative   PRIOR THERAPY:  1) Status post wedge resection x3 of the right lower lobe with lymph node sampling under the care of Dr. Roxan Hockey on 10/09/2018. 2) Adjuvant systemic chemotherapy with cisplatin 75 mg/M2 and Alimta 500 mg/M2 every 3 weeks.  First dose November 22, 2018.  Status post 4 cycles.  Last dose was given on 01/28/2019.  CURRENT THERAPY: Observation.   INTERVAL HISTORY: Stephanie Crawford 74 y.o. female returns to the clinic today for follow-up visit.  The patient is feeling fine today with no concerning complaints except for mild peripheral neuropathy especially at nighttime.  She denied having any current chest pain, shortness of breath, cough or hemoptysis.  She denied having any fever or chills.  She has no nausea, vomiting, diarrhea or constipation.  She denied having any headache or visual changes.  She is here today for evaluation with repeat CT scan of the chest for restaging of her disease.  MEDICAL HISTORY: Past Medical History:  Diagnosis Date  . Allergic rhinitis   . Allergy   . Anemia    prior to hysterectomy--1997  . GERD (gastroesophageal reflux disease)   . Heart murmur   . Hemorrhoid 2011  . History of shingles 04/2014  . Lung cancer ([place])   . Nodule of lower lobe of right lung     ALLERGIES:  is allergic to adhesive  [tape].  MEDICATIONS:  Current Outpatient Medications  Medication Sig Dispense Refill  . diclofenac Sodium (VOLTAREN) 1 % GEL APPLY 2-4 GRAMS TO AFFECTED JOINT UP TO 4 TIMES DAILY. 400 g 2  . aspirin 325 MG EC tablet Take 650 mg by mouth daily as needed for pain.    Marland Kitchen azelastine (OPTIVAR) 0.05 % ophthalmic solution Place 1 drop into both eyes daily as needed (irritation).    . calcium carbonate (OS-CAL) 600 MG TABS tablet Take 1,200 mg by mouth every evening.     . Cholecalciferol (VITAMIN D) 50 MCG (2000 UT) tablet Take 2,000 Units by mouth at bedtime.    . folic acid (FOLVITE) 1 MG tablet TAKE 1 TABLET BY MOUTH EVERY DAY 30 tablet 4  . mupirocin cream (BACTROBAN) 2 % Apply 1 application topically 3 (three) times daily. 15 g 0  . Omega-3 1000 MG CAPS Take 1,000 mg by mouth at bedtime.    . prochlorperazine (COMPAZINE) 10 MG tablet TAKE 1 TABLET (10 MG TOTAL) BY MOUTH EVERY 6 (SIX) HOURS AS NEEDED FOR NAUSEA OR VOMITING. 30 tablet 0   No current facility-administered medications for this visit.    SURGICAL HISTORY:  Past Surgical History:  Procedure Laterality Date  . ABDOMINAL HYSTERECTOMY  1997  . APPENDECTOMY  1960  . HEMORRHOID SURGERY  9/11   8/27 had a follow up procedure.    . IR THORACENTESIS ASP PLEURAL SPACE W/IMG GUIDE  10/24/2018  . LIPOMA EXCISION  1975   neck,  left breast and righ jaw.  Marland Kitchen VIDEO ASSISTED THORACOSCOPY (VATS)/WEDGE RESECTION Right 10/09/2018   Procedure: VIDEO ASSISTED THORACOSCOPY (VATS)/WEDGE RESECTION;  Surgeon: Melrose Nakayama, MD;  Location: [facility] OR;  Service: Thoracic;  Laterality: Right;    REVIEW OF SYSTEMS:  A comprehensive review of systems was negative except for: Neurological: positive for paresthesia   PHYSICAL EXAMINATION: General appearance: alert, cooperative and no distress Head: Normocephalic, without obvious abnormality, atraumatic Neck: no adenopathy, no JVD, supple, symmetrical, trachea midline and thyroid not enlarged, symmetric,  no tenderness/mass/nodules Lymph nodes: Cervical, supraclavicular, and axillary nodes normal. Resp: clear to auscultation bilaterally Back: symmetric, no curvature. ROM normal. No CVA tenderness. Cardio: regular rate and rhythm, S1, S2 normal, no murmur, click, rub or gallop GI: soft, non-tender; bowel sounds normal; no masses,  no organomegaly Extremities: extremities normal, atraumatic, no cyanosis or edema  ECOG PERFORMANCE STATUS: 1 - Symptomatic but completely ambulatory  Blood pressure (!) 145/87, pulse 85, temperature 98.5 F (36.9 C), temperature source Temporal, resp. rate 18, height '5\' 9"'  (1.753 m), weight 221 lb 8 oz (100.5 kg), last menstrual period 05/23/1995, SpO2 99 %.  LABORATORY DATA: Lab Results  Component Value Date   WBC 6.0 06/24/2019   HGB 12.6 06/24/2019   HCT 37.7 06/24/2019   MCV 92.2 06/24/2019   PLT 229 06/24/2019      Chemistry      Component Value Date/Time   NA 142 06/24/2019 1416   K 4.0 06/24/2019 1416   CL 105 06/24/2019 1416   CO2 28 06/24/2019 1416   BUN 15 06/24/2019 1416   CREATININE 0.99 06/24/2019 1416   CREATININE 0.75 07/14/2014 1231      Component Value Date/Time   CALCIUM 9.4 06/24/2019 1416   CALCIUM 10.1 12/28/2009 2231   ALKPHOS 86 06/24/2019 1416   AST 21 06/24/2019 1416   ALT 19 06/24/2019 1416   BILITOT 0.9 06/24/2019 1416       RADIOGRAPHIC STUDIES: CT Chest W Contrast  Result Date: 06/24/2019 CLINICAL DATA:  History of lung cancer diagnosed in 2020 post chemotherapy and wedge resection in the right chest. EXAM: CT CHEST WITH CONTRAST TECHNIQUE: Multidetector CT imaging of the chest was performed during intravenous contrast administration. CONTRAST:  54m OMNIPAQUE IOHEXOL 300 MG/ML  SOLN COMPARISON:  02/21/2019 FINDINGS: Cardiovascular: Mild ectasia of the ascending thoracic aorta. Scattered atherosclerotic changes. Heart size at upper limits of normal without signs of pericardial effusion. Signs of coronary artery  disease as before. Central pulmonary vasculature is grossly normal not well assessed on today's study. Mediastinum/Nodes: No signs of thoracic inlet, axillary or mediastinal lymphadenopathy. No signs of hilar adenopathy. Esophagus is grossly normal. Lungs/Pleura: Post wedge resection in the right lower lobe with diminished soft tissue thickening along resection changes. No pleural effusion or pleural nodularity in the right chest. Similar degree of nodularity noted in the coronal plane along suture lines along the medial right lower lobe, this area in the coronal plane measures 1.3 as compared to 1.4 cm in greatest axial dimension (image 80, series 5) airways remain patent. Mild left basilar atelectasis. Left chest otherwise clear. Upper Abdomen: Imaged portions of the upper abdomen including the bilateral adrenal glands are normal. Musculoskeletal: No signs of chest wall mass. No signs of acute bone finding or evidence of destructive bone process. IMPRESSION: 1. Post wedge resection changes in the right lower lobe with diminished soft tissue thickening along the resection changes. Area of greatest thickening approximately 13 mm, along the medial right chest also  slightly diminished. Attention on follow-up. Likely represents postoperative changes. 2. No new suspicious pulmonary nodules or masses. Stable tiny scattered pulmonary nodules without change. 3. No evidence of thoracic lymphadenopathy. 4. Mild ectasia of the ascending thoracic aorta. Electronically Signed   By: Zetta Bills M.D.   On: 06/24/2019 17:11    ASSESSMENT AND PLAN: This is a very pleasant 74 years old African-American female with stage IIb (T3, N0, M0) non-small cell lung cancer, adenocarcinoma with multifocal disease in the right lower lobe status post wedge resection x3 of the right lower lobe with lymph node sampling in May 2020. The patient has no actionable mutations and she has negative PDL 1 expression. She underwent 4 cycles of  adjuvant systemic chemotherapy with cisplatin and Alimta.  She tolerated her treatment well with no concerning complaints except for fatigue. The patient is currently on observation and she is feeling fine with no concerning complaints except for mild peripheral neuropathy. She had repeat CT scan of the chest performed recently.  I personally and independently reviewed the scans and discussed the results with the patient today. Her scan showed no concerning findings for disease recurrence or metastasis. I recommended for the patient to continue on observation with repeat CT scan of the chest in 4 months. For the peripheral neuropathy, I will start the patient on gabapentin 100 mg p.o. 3 times daily.   For hypertension she will continue with her current blood pressure medication and monitor it closely at home. The patient voices understanding of current disease status and treatment options and is in agreement with the current care plan. All questions were answered. The patient knows to call the clinic with any problems, questions or concerns. We can certainly see the patient much sooner if necessary.  Disclaimer: This note was dictated with voice recognition software. Similar sounding words can inadvertently be transcribed and may not be corrected upon review.

## 2019-06-27 ENCOUNTER — Telehealth: Payer: Self-pay | Admitting: Internal Medicine

## 2019-06-27 NOTE — Telephone Encounter (Signed)
Scheduled per los. Called and left msg. Mailed printout  °

## 2019-06-30 ENCOUNTER — Telehealth: Payer: Self-pay | Admitting: *Deleted

## 2019-06-30 NOTE — Telephone Encounter (Signed)
Submitted a Prior Authorization request to [employer] for Voltaren Gel via Cover My Meds. Will update once we receive a response.

## 2019-07-17 ENCOUNTER — Ambulatory Visit: Payer: BC Managed Care – PPO | Attending: Internal Medicine

## 2019-07-17 DIAGNOSIS — Z23 Encounter for immunization: Secondary | ICD-10-CM | POA: Insufficient documentation

## 2019-07-17 NOTE — Progress Notes (Signed)
   Covid-19 Vaccination Clinic  Name:  Stephanie Crawford    MRN: [id number] DOB: 04-27-1946  07/17/2019  [name] was observed post Covid-19 immunization for 15 minutes without incidence. She was provided with Vaccine Information Sheet and instruction to access the V-Safe system.   [name] was instructed to call 911 with any severe reactions post vaccine: Marland Kitchen Difficulty breathing  . Swelling of your face and throat  . A fast heartbeat  . A bad rash all over your body  . Dizziness and weakness    Immunizations Administered    Name Date Dose VIS Date Route   Pfizer COVID-19 Vaccine 07/17/2019  3:34 PM 0.3 mL 05/02/2019 Intramuscular   Manufacturer: [place]   Lot: [id number]   [place]: [id number]

## 2019-07-23 ENCOUNTER — Other Ambulatory Visit: Payer: Self-pay | Admitting: Rheumatology

## 2019-07-23 NOTE — Telephone Encounter (Signed)
Last Visit: 02/06/2019 Next Visit:02/05/2020  Okay to refill per Dr. Estanislado Pandy

## 2019-07-29 DIAGNOSIS — K648 Other hemorrhoids: Secondary | ICD-10-CM | POA: Diagnosis not present

## 2019-07-29 DIAGNOSIS — K922 Gastrointestinal hemorrhage, unspecified: Secondary | ICD-10-CM | POA: Diagnosis not present

## 2019-07-29 DIAGNOSIS — C3491 Malignant neoplasm of unspecified part of right bronchus or lung: Secondary | ICD-10-CM | POA: Diagnosis not present

## 2019-08-01 ENCOUNTER — Encounter: Payer: Self-pay | Admitting: Gastroenterology

## 2019-08-13 ENCOUNTER — Ambulatory Visit: Payer: BC Managed Care – PPO | Attending: Internal Medicine

## 2019-08-13 DIAGNOSIS — Z23 Encounter for immunization: Secondary | ICD-10-CM

## 2019-08-13 NOTE — Progress Notes (Signed)
   Covid-19 Vaccination Clinic  Name:  Stephanie Crawford    MRN: [id number] DOB: Jul 17, 1945  08/13/2019  [name] was observed post Covid-19 immunization for 15 minutes without incident. She was provided with Vaccine Information Sheet and instruction to access the V-Safe system.   [name] was instructed to call 911 with any severe reactions post vaccine: Marland Kitchen Difficulty breathing  . Swelling of face and throat  . A fast heartbeat  . A bad rash all over body  . Dizziness and weakness   Immunizations Administered    Name Date Dose VIS Date Route   Pfizer COVID-19 Vaccine 08/13/2019  8:18 AM 0.3 mL 05/02/2019 Intramuscular   Manufacturer: [place]   Lot: [id number]   [place]: [id number]

## 2019-09-09 ENCOUNTER — Other Ambulatory Visit: Payer: Self-pay

## 2019-09-09 ENCOUNTER — Ambulatory Visit (INDEPENDENT_AMBULATORY_CARE_PROVIDER_SITE_OTHER): Payer: BC Managed Care – PPO | Admitting: Gastroenterology

## 2019-09-09 ENCOUNTER — Encounter: Payer: Self-pay | Admitting: Gastroenterology

## 2019-09-09 VITALS — BP 124/78 | HR 80 | Temp 97.4°F | Ht 69.0 in | Wt 226.8 lb

## 2019-09-09 DIAGNOSIS — K625 Hemorrhage of anus and rectum: Secondary | ICD-10-CM

## 2019-09-09 DIAGNOSIS — Z1211 Encounter for screening for malignant neoplasm of colon: Secondary | ICD-10-CM | POA: Diagnosis not present

## 2019-09-09 MED ORDER — SUPREP BOWEL PREP KIT 17.5-3.13-1.6 GM/177ML PO SOLN: ORAL | 0 refills | Status: DC

## 2019-09-09 NOTE — Patient Instructions (Addendum)
If you are age 73 or older, your body mass index should be between 23-30. Your Body mass index is 33.49 kg/m. If this is out of the aforementioned range listed, please consider follow up with your Primary Care Provider.  If you are age 51 or younger, your body mass index should be between 19-25. Your Body mass index is 33.49 kg/m. If this is out of the aformentioned range listed, please consider follow up with your Primary Care Provider.   You have been scheduled for a colonoscopy. Please follow written instructions given to you at your visit today.  Please pick up your prep supplies at the pharmacy within the next 1-3 days. If you use inhalers (even only as needed), please bring them with you on the day of your procedure. Your physician has requested that you go to www.startemmi.com and enter the access code given to you at your visit today. This web site gives a general overview about your procedure. However, you should still follow specific instructions given to you by our office regarding your preparation for the procedure.    Thank you for entrusting me with your care and for choosing [facility], Dr. Seacliff Cellar

## 2019-09-09 NOTE — Progress Notes (Signed)
HPI :  74 y/o female with a history of hemorrhoids s/p hemorrhoidectomy, GERD, history of lung cancer s/p resection, referred by [facility] primary care for rectal bleeding.  The patient states she has a history of hemorrhoids that bother her about 10 years ago, she had a colonoscopy at that time which did not show any polyps and remarkable findings of diverticulosis and hemorrhoids.  She was referred to surgery and underwent hemorrhoidectomy.  This is worked quite well for her over time and she generally does not have any problems with her bowels.  Last month she had 2 episodes of blood noted mostly on the toilet paper.  Initially was blood with red clots and then scant blood thereafter.  Outside of these episodes she has not had any other issues.  No blood in her stools routinely.  No rectal pain or discomfort in the setting of the bleeding.  No abdominal pains.  She denies any straining or constipation.  No diarrhea.  He does have a history of lung cancer for which she underwent a resection as well as chemotherapy and completed that last September.  She is in remission from what she can gather and has follow-up CT scans for surveillance for this.  She denies any blood thinners.  Takes aspirin as needed.  She will is not have any further problems that bother her.  She has very rare reflux but does not really take anything for it since it does not bother her much.  No family history of colon cancer.  No problems with anesthesia.  No cardiopulmonary symptoms.   Colonoscopy 11/2009 - diverticulosis, no polyps, external hemorrhoids   Past Medical History:  Diagnosis Date  . Allergic rhinitis   . Allergy   . Anemia    prior to hysterectomy--1997  . GERD (gastroesophageal reflux disease)   . Heart murmur   . Hemorrhoid 2011  . History of shingles 04/2014  . Lung cancer ([place])   . Nodule of lower lobe of right lung      Past Surgical History:  Procedure Laterality Date  . ABDOMINAL HYSTERECTOMY   1997  . APPENDECTOMY  1960  . HEMORRHOID SURGERY  9/11   8/27 had a follow up procedure.    . IR THORACENTESIS ASP PLEURAL SPACE W/IMG GUIDE  10/24/2018  . LIPOMA EXCISION  1975   neck, left breast and righ jaw.  Marland Kitchen VIDEO ASSISTED THORACOSCOPY (VATS)/WEDGE RESECTION Right 10/09/2018   Procedure: VIDEO ASSISTED THORACOSCOPY (VATS)/WEDGE RESECTION;  Surgeon: Melrose Nakayama, MD;  Location: [facility];  Service: Thoracic;  Laterality: Right;  . WRIST FRACTURE SURGERY     Family History  Problem Relation Age of Onset  . Prostate cancer Father   . Lung cancer Maternal Grandmother   . Asthma Daughter   . Asthma Sister   . Heart attack Brother   . Colon cancer Neg Hx   . Esophageal cancer Neg Hx   . Pancreatic cancer Neg Hx   . Stomach cancer Neg Hx   . Liver disease Neg Hx    Social History   Tobacco Use  . Smoking status: Never Smoker  . Smokeless tobacco: Never Used  Substance Use Topics  . Alcohol use: No    Alcohol/week: 0.0 standard drinks  . Drug use: No   Current Outpatient Medications  Medication Sig Dispense Refill  . aspirin 325 MG EC tablet Take 650 mg by mouth daily as needed for pain.    Marland Kitchen azelastine (OPTIVAR) 0.05 % ophthalmic  solution Place 1 drop into both eyes daily as needed (irritation).    . calcium carbonate (OS-CAL) 600 MG TABS tablet Take 1,200 mg by mouth every evening.     . Cholecalciferol (VITAMIN D) 50 MCG (2000 UT) tablet Take 2,000 Units by mouth at bedtime.    . diclofenac Sodium (VOLTAREN) 1 % GEL APPLY 2-4 GRAMS TO AFFECTED JOINT UP TO 4 TIMES DAILY. 400 g 2  . Omega-3 1000 MG CAPS Take 1,000 mg by mouth at bedtime.     No current facility-administered medications for this visit.   Allergies  Allergen Reactions  . Adhesive [Tape] Rash     Review of Systems: All systems reviewed and negative except where noted in HPI.   Lab Results  Component Value Date   WBC 6.0 06/24/2019   HGB 12.6 06/24/2019   HCT 37.7 06/24/2019   MCV 92.2  06/24/2019   PLT 229 06/24/2019    Lab Results  Component Value Date   CREATININE 0.99 06/24/2019   BUN 15 06/24/2019   NA 142 06/24/2019   K 4.0 06/24/2019   CL 105 06/24/2019   CO2 28 06/24/2019    Lab Results  Component Value Date   ALT 19 06/24/2019   AST 21 06/24/2019   ALKPHOS 86 06/24/2019   BILITOT 0.9 06/24/2019     Physical Exam: BP 124/78   Pulse 80   Temp (!) 97.4 F (36.3 C)   Ht 5\' 9"  (1.753 m)   Wt 226 lb 12.8 oz (102.9 kg)   LMP 05/23/1995   BMI 33.49 kg/m  Constitutional: Pleasant,well-developed, female in no acute distress. HEENT: Normocephalic and atraumatic. Conjunctivae are normal. No scleral icterus. Neck supple.  Cardiovascular: Normal rate, regular rhythm.  Pulmonary/chest: Effort normal and breath sounds normal. No wheezing, rales or rhonchi. Abdominal: Soft, nondistended, nontender. There are no masses palpable.  Extremities: no edema Lymphadenopathy: No cervical adenopathy noted. Neurological: Alert and oriented to person place and time. Skin: Skin is warm and dry. No rashes noted. Psychiatric: Normal mood and affect. Behavior is normal.   ASSESSMENT AND PLAN: 74 year old female here for reassessment the following:  Rectal bleeding / colon cancer screening - 2 episodes of what sounds like small volume superficial rectal bleeding in March.  Otherwise denies any other symptoms and has not had recurrence of episodes since.  Hemoglobin normal in February.  This sounds superficial and likely related to anorectal source however given she is not had a colonoscopy in 10 years and due for screening regardless, recommend colonoscopy to evaluate this and ensure no other cause.  I discussed risk and benefits of colonoscopy with her and she wanted to proceed.  I had an opening later this week and she wants to proceed with it.  In this light we will defer rectal exam today given we will be examining her in the next 2 days.  She agreed with the plan, all  questions answered.  Wakonda Cellar, MD [facility]  CC: [facility]

## 2019-09-11 ENCOUNTER — Other Ambulatory Visit: Payer: Self-pay

## 2019-09-11 ENCOUNTER — Ambulatory Visit (AMBULATORY_SURGERY_CENTER): Payer: BC Managed Care – PPO | Admitting: Gastroenterology

## 2019-09-11 ENCOUNTER — Encounter: Payer: Self-pay | Admitting: Gastroenterology

## 2019-09-11 VITALS — BP 143/82 | HR 78 | Temp 96.9°F | Resp 17 | Ht 69.0 in | Wt 226.0 lb

## 2019-09-11 DIAGNOSIS — K622 Anal prolapse: Secondary | ICD-10-CM | POA: Diagnosis not present

## 2019-09-11 DIAGNOSIS — D120 Benign neoplasm of cecum: Secondary | ICD-10-CM | POA: Diagnosis not present

## 2019-09-11 DIAGNOSIS — D123 Benign neoplasm of transverse colon: Secondary | ICD-10-CM

## 2019-09-11 DIAGNOSIS — K648 Other hemorrhoids: Secondary | ICD-10-CM | POA: Diagnosis not present

## 2019-09-11 DIAGNOSIS — K625 Hemorrhage of anus and rectum: Secondary | ICD-10-CM | POA: Diagnosis not present

## 2019-09-11 DIAGNOSIS — K5730 Diverticulosis of large intestine without perforation or abscess without bleeding: Secondary | ICD-10-CM

## 2019-09-11 DIAGNOSIS — Z1211 Encounter for screening for malignant neoplasm of colon: Secondary | ICD-10-CM | POA: Diagnosis not present

## 2019-09-11 DIAGNOSIS — D122 Benign neoplasm of ascending colon: Secondary | ICD-10-CM | POA: Diagnosis not present

## 2019-09-11 DIAGNOSIS — D129 Benign neoplasm of anus and anal canal: Secondary | ICD-10-CM

## 2019-09-11 MED ORDER — SODIUM CHLORIDE 0.9 % IV SOLN: 500.0000 mL | Freq: Once | INTRAVENOUS | Status: DC

## 2019-09-11 NOTE — Patient Instructions (Signed)
HANDOUTS PROVIDED ON: POLYPS, DIVERTICULOSIS, & HEMORRHOIDS  The polyps removed today have been sent for pathology.  The results can take 1-3 weeks to receive.  When your next colonoscopy should occur will be based on the pathology results.    You may resume your previous diet and medication schedule.  Thank you for allowing [place] to care for you today!!!   YOU HAD AN ENDOSCOPIC PROCEDURE TODAY AT [facility]:   Refer to the procedure report that was given to you for any specific questions about what was found during the examination.  If the procedure report does not answer your questions, please call your gastroenterologist to clarify.  If you requested that your care partner not be given the details of your procedure findings, then the procedure report has been included in a sealed envelope for you to review at your convenience later.  YOU SHOULD EXPECT: Some feelings of bloating in the abdomen. Passage of more gas than usual.  Walking can help get rid of the air that was put into your GI tract during the procedure and reduce the bloating. If you had a lower endoscopy (such as a colonoscopy or flexible sigmoidoscopy) you may notice spotting of blood in your stool or on the toilet paper. If you underwent a bowel prep for your procedure, you may not have a normal bowel movement for a few days.  Please Note:  You might notice some irritation and congestion in your nose or some drainage.  This is from the oxygen used during your procedure.  There is no need for concern and it should clear up in a day or so.  SYMPTOMS TO REPORT IMMEDIATELY:   Following lower endoscopy (colonoscopy or flexible sigmoidoscopy):  Excessive amounts of blood in the stool  Significant tenderness or worsening of abdominal pains  Swelling of the abdomen that is new, acute  Fever of 100F or higher  For urgent or emergent issues, a gastroenterologist can be reached at any hour by calling [phone]. Do  not use MyChart messaging for urgent concerns.    DIET:  We do recommend a small meal at first, but then you may proceed to your regular diet.  Drink plenty of fluids but you should avoid alcoholic beverages for 24 hours.  ACTIVITY:  You should plan to take it easy for the rest of today and you should NOT DRIVE or use heavy machinery until tomorrow (because of the sedation medicines used during the test).    FOLLOW UP: Our staff will call the number listed on your records 48-72 hours following your procedure to check on you and address any questions or concerns that you may have regarding the information given to you following your procedure. If we do not reach you, we will leave a message.  We will attempt to reach you two times.  During this call, we will ask if you have developed any symptoms of COVID 19. If you develop any symptoms (ie: fever, flu-like symptoms, shortness of breath, cough etc.) before then, please call [phone].  If you test positive for Covid 19 in the 2 weeks post procedure, please call and report this information to [place].    If any biopsies were taken you will be contacted by phone or by letter within the next 1-3 weeks.  Please call us at [phone] if you have not heard about the biopsies in 3 weeks.    SIGNATURES/CONFIDENTIALITY: You and/or your care partner have signed paperwork which will  be entered into your electronic medical record.  These signatures attest to the fact that that the information above on your After Visit Summary has been reviewed and is understood.  Full responsibility of the confidentiality of this discharge information lies with you and/or your care-partner.

## 2019-09-11 NOTE — Progress Notes (Signed)
Stephanie Crawford

## 2019-09-11 NOTE — Progress Notes (Signed)
pt tolerated well. VSS. awake and to recovery. Report given to RN.  

## 2019-09-11 NOTE — Progress Notes (Signed)
Called to room to assist during endoscopic procedure.  Patient ID and intended procedure confirmed with present staff. Received instructions for my participation in the procedure from the performing physician.  

## 2019-09-11 NOTE — Op Note (Signed)
[place] Patient Name: Stephanie Crawford Procedure Date: 09/11/2019 4:04 PM MRN: [id number] Endoscopist: Remo Lipps P. Havery Moros , MD Age: 74 Referring MD:  Date of Birth: 07-13-1945 Gender: Female Account #: [id number] Procedure:                Colonoscopy Indications:              Rectal bleeding Medicines:                Monitored Anesthesia Care Procedure:                Pre-Anesthesia Assessment:                           - Prior to the procedure, a History and Physical                            was performed, and patient medications and                            allergies were reviewed. The patient's tolerance of                            previous anesthesia was also reviewed. The risks                            and benefits of the procedure and the sedation                            options and risks were discussed with the patient.                            All questions were answered, and informed consent                            was obtained. Prior Anticoagulants: The patient has                            taken no previous anticoagulant or antiplatelet                            agents. ASA Grade Assessment: III - A patient with                            severe systemic disease. After reviewing the risks                            and benefits, the patient was deemed in                            satisfactory condition to undergo the procedure.                           After obtaining informed consent, the colonoscope  was passed under direct vision. Throughout the                            procedure, the patient's blood pressure, pulse, and                            oxygen saturations were monitored continuously. The                            Colonoscope was introduced through the anus and                            advanced to the the terminal ileum, with                            identification of the appendiceal orifice  and IC                            valve. The colonoscopy was performed without                            difficulty. The patient tolerated the procedure                            well. The quality of the bowel preparation was                            good. The terminal ileum, ileocecal valve,                            appendiceal orifice, and rectum were photographed. Scope In: 4:18:24 PM Scope Out: 4:48:47 PM Scope Withdrawal Time: 0 hours 24 minutes 42 seconds  Total Procedure Duration: 0 hours 30 minutes 23 seconds  Findings:                 The digital rectal exam was abnormal with suspected                            scarring from prior hemorrhoidectomy, and somewhat                            nodular mucosa.                           The terminal ileum appeared normal.                           A 3 mm polyp was found in the cecum. The polyp was                            sessile. The polyp was removed with a cold snare.                            Resection and retrieval were complete.  Seven flat and sessile polyps were found in the                            ascending colon. The polyps were 3 to 5 mm in size.                            These polyps were removed with a cold snare.                            Resection and retrieval were complete.                           A 7 mm polyp was found in the hepatic flexure. The                            polyp was sessile. The polyp was removed with a                            cold snare. Resection and retrieval were complete.                           A 7 mm polyp was found in the transverse colon. The                            polyp was flat. The polyp was removed with a cold                            snare. Resection and retrieval were complete.                           Multiple medium-mouthed diverticula were found in                            the transverse colon and left colon.                            Internal hemorrhoids were found during retroflexion.                           Scarring from prior hemorrhoid surgery was noted in                            the distal rectum approximating the dentate line.                            Focal abnormal mucosa was found at the dentate line                            in this area that I suspect is granulation tissue /                            inflammatory but biopsied to ensure no  adenomatous                            change.                           The exam was otherwise normal throughout the                            examined colon. Complications:            No immediate complications. Estimated blood loss:                            Minimal. Estimated Blood Loss:     Estimated blood loss was minimal. Impression:               - Abnormal digital rectal exam as above.                           - The examined portion of the ileum was normal.                           - One 3 mm polyp in the cecum, removed with a cold                            snare. Resected and retrieved.                           - Seven 3 to 5 mm polyps in the ascending colon,                            removed with a cold snare. Resected and retrieved.                           - One 7 mm polyp at the hepatic flexure, removed                            with a cold snare. Resected and retrieved.                           - One 7 mm polyp in the transverse colon, removed                            with a cold snare. Resected and retrieved.                           - Diverticulosis in the transverse colon and in the                            left colon.                           - Internal hemorrhoids.                           -  Scarring from prior hemorrhoidectomy with some                            nodular tissue at the dentate line - suspect                            granulation tissue but biopsied to rule out                            adenomatous  change. Recommendation:           - Patient has a contact number available for                            emergencies. The signs and symptoms of potential                            delayed complications were discussed with the                            patient. Return to normal activities tomorrow.                            Written discharge instructions were provided to the                            patient.                           - Resume previous diet.                           - Continue present medications.                           - Await pathology results. Remo Lipps P. Chevon Laufer, MD 09/11/2019 5:04:34 PM This report has been signed electronically.

## 2019-09-15 ENCOUNTER — Telehealth: Payer: Self-pay | Admitting: *Deleted

## 2019-09-15 ENCOUNTER — Telehealth: Payer: Self-pay

## 2019-09-15 NOTE — Telephone Encounter (Signed)
VM full and unable to leave message on 2nd follow up call.

## 2019-09-15 NOTE — Telephone Encounter (Signed)
Attempted x2 unable to leave message.

## 2019-10-01 ENCOUNTER — Other Ambulatory Visit: Payer: Self-pay | Admitting: Internal Medicine

## 2019-10-01 DIAGNOSIS — E559 Vitamin D deficiency, unspecified: Secondary | ICD-10-CM | POA: Diagnosis not present

## 2019-10-01 DIAGNOSIS — E7800 Pure hypercholesterolemia, unspecified: Secondary | ICD-10-CM | POA: Diagnosis not present

## 2019-10-01 DIAGNOSIS — Z1231 Encounter for screening mammogram for malignant neoplasm of breast: Secondary | ICD-10-CM

## 2019-10-01 DIAGNOSIS — M810 Age-related osteoporosis without current pathological fracture: Secondary | ICD-10-CM

## 2019-10-01 DIAGNOSIS — Z0000 Encounter for general adult medical examination without abnormal findings: Secondary | ICD-10-CM | POA: Diagnosis not present

## 2019-10-21 ENCOUNTER — Other Ambulatory Visit: Payer: Self-pay

## 2019-10-21 ENCOUNTER — Other Ambulatory Visit: Payer: BC Managed Care – PPO

## 2019-10-21 ENCOUNTER — Inpatient Hospital Stay: Payer: BC Managed Care – PPO | Attending: Internal Medicine

## 2019-10-21 ENCOUNTER — Ambulatory Visit (HOSPITAL_COMMUNITY)
Admission: RE | Admit: 2019-10-21 | Discharge: 2019-10-21 | Disposition: A | Discharge status: Home / Self Care | Payer: BC Managed Care – PPO | Source: Ambulatory Visit | Attending: Internal Medicine | Admitting: Internal Medicine

## 2019-10-21 DIAGNOSIS — I10 Essential (primary) hypertension: Secondary | ICD-10-CM | POA: Insufficient documentation

## 2019-10-21 DIAGNOSIS — Z9221 Personal history of antineoplastic chemotherapy: Secondary | ICD-10-CM | POA: Insufficient documentation

## 2019-10-21 DIAGNOSIS — C3490 Malignant neoplasm of unspecified part of unspecified bronchus or lung: Secondary | ICD-10-CM | POA: Diagnosis not present

## 2019-10-21 DIAGNOSIS — C3432 Malignant neoplasm of lower lobe, left bronchus or lung: Secondary | ICD-10-CM | POA: Diagnosis not present

## 2019-10-21 DIAGNOSIS — C3431 Malignant neoplasm of lower lobe, right bronchus or lung: Secondary | ICD-10-CM | POA: Diagnosis not present

## 2019-10-21 DIAGNOSIS — G629 Polyneuropathy, unspecified: Secondary | ICD-10-CM | POA: Insufficient documentation

## 2019-10-21 LAB — CBC WITH DIFFERENTIAL (CANCER CENTER ONLY)
Abs Immature Granulocytes: 0.02 10*3/uL (ref 0.00–0.07)
Basophils Absolute: 0.1 10*3/uL (ref 0.0–0.1)
Basophils Relative: 1 %
Eosinophils Absolute: 0.2 10*3/uL (ref 0.0–0.5)
Eosinophils Relative: 3 %
HCT: 37.2 % (ref 36.0–46.0)
Hemoglobin: 12.7 g/dL (ref 12.0–15.0)
Immature Granulocytes: 0 %
Lymphocytes Relative: 46 %
Lymphs Abs: 3 10*3/uL (ref 0.7–4.0)
MCH: 31.1 pg (ref 26.0–34.0)
MCHC: 34.1 g/dL (ref 30.0–36.0)
MCV: 91.2 fL (ref 80.0–100.0)
Monocytes Absolute: 0.6 10*3/uL (ref 0.1–1.0)
Monocytes Relative: 9 %
Neutro Abs: 2.6 10*3/uL (ref 1.7–7.7)
Neutrophils Relative %: 41 %
Platelet Count: 240 10*3/uL (ref 150–400)
RBC: 4.08 MIL/uL (ref 3.87–5.11)
RDW: 11.9 % (ref 11.5–15.5)
WBC Count: 6.5 10*3/uL (ref 4.0–10.5)
nRBC: 0 % (ref 0.0–0.2)

## 2019-10-21 LAB — CMP (CANCER CENTER ONLY)
ALT: 15 U/L (ref 0–44)
AST: 15 U/L (ref 15–41)
Albumin: 3.8 g/dL (ref 3.5–5.0)
Alkaline Phosphatase: 82 U/L (ref 38–126)
Anion gap: 11 (ref 5–15)
BUN: 14 mg/dL (ref 8–23)
CO2: 24 mmol/L (ref 22–32)
Calcium: 10 mg/dL (ref 8.9–10.3)
Chloride: 106 mmol/L (ref 98–111)
Creatinine: 1.15 mg/dL — ABNORMAL HIGH (ref 0.44–1.00)
GFR, Est AFR Am: 55 mL/min — ABNORMAL LOW (ref >60)
GFR, Estimated: 47 mL/min — ABNORMAL LOW (ref >60)
Glucose, Bld: 113 mg/dL — ABNORMAL HIGH (ref 70–99)
Potassium: 3.4 mmol/L — ABNORMAL LOW (ref 3.5–5.1)
Sodium: 141 mmol/L (ref 135–145)
Total Bilirubin: 0.7 mg/dL (ref 0.3–1.2)
Total Protein: 6.7 g/dL (ref 6.5–8.1)

## 2019-10-21 MED ORDER — IOHEXOL 300 MG/ML  SOLN
75.0000 mL | Freq: Once | INTRAMUSCULAR | Status: AC | PRN
  Administered 2019-10-21: 75 mL via INTRAVENOUS

## 2019-10-21 MED ORDER — SODIUM CHLORIDE (PF) 0.9 % IJ SOLN
INTRAMUSCULAR | Status: AC
  Filled 2019-10-21: qty 50

## 2019-10-23 ENCOUNTER — Telehealth: Payer: Self-pay | Admitting: Internal Medicine

## 2019-10-23 ENCOUNTER — Inpatient Hospital Stay (HOSPITAL_BASED_OUTPATIENT_CLINIC_OR_DEPARTMENT_OTHER): Payer: BC Managed Care – PPO | Admitting: Internal Medicine

## 2019-10-23 ENCOUNTER — Encounter: Payer: Self-pay | Admitting: Internal Medicine

## 2019-10-23 ENCOUNTER — Other Ambulatory Visit: Payer: Self-pay

## 2019-10-23 VITALS — BP 145/87 | HR 89 | Temp 96.8°F | Resp 18 | Ht 69.0 in | Wt 230.1 lb

## 2019-10-23 DIAGNOSIS — C3491 Malignant neoplasm of unspecified part of right bronchus or lung: Secondary | ICD-10-CM

## 2019-10-23 DIAGNOSIS — I10 Essential (primary) hypertension: Secondary | ICD-10-CM | POA: Diagnosis not present

## 2019-10-23 DIAGNOSIS — G629 Polyneuropathy, unspecified: Secondary | ICD-10-CM | POA: Diagnosis not present

## 2019-10-23 DIAGNOSIS — Z9221 Personal history of antineoplastic chemotherapy: Secondary | ICD-10-CM | POA: Diagnosis not present

## 2019-10-23 DIAGNOSIS — C3490 Malignant neoplasm of unspecified part of unspecified bronchus or lung: Secondary | ICD-10-CM

## 2019-10-23 DIAGNOSIS — C3432 Malignant neoplasm of lower lobe, left bronchus or lung: Secondary | ICD-10-CM | POA: Diagnosis not present

## 2019-10-23 NOTE — Telephone Encounter (Signed)
Scheduled per 6/3 los. Printed avs and calendar for pt.

## 2019-10-23 NOTE — Progress Notes (Signed)
[facility] [phone]   [phone]  OFFICE PROGRESS NOTE  [name], MD [address] [place] [address]  DIAGNOSIS: Stage IIB (T3, N0, M0) non-small cell lung cancer, adenocarcinoma diagnosed in May 2020.  Biomarker Findings Microsatellite status - Cannot Be Determined Tumor Mutational Burden - Cannot Be Determined Genomic Findings For a complete list of the genes assayed, please refer to the Appendix. KRAS G12V TP53 G244V 7 Disease relevant genes with no reportable alterations: ALK, BRAF, EGFR, ERBB2, MET, RET, ROS1   PDL 1 expression: negative   PRIOR THERAPY:  1) Status post wedge resection x3 of the right lower lobe with lymph node sampling under the care of Dr. Roxan Hockey on 10/09/2018. 2) Adjuvant systemic chemotherapy with cisplatin 75 mg/M2 and Alimta 500 mg/M2 every 3 weeks.  First dose November 22, 2018.  Status post 4 cycles.  Last dose was given on 01/28/2019.  CURRENT THERAPY: Observation.  INTERVAL HISTORY: Stephanie Crawford 74 y.o. female returns to the clinic today for follow-up visit.  The patient is feeling fine today with no concerning complaints.  She denied having any chest pain, shortness of breath, cough or hemoptysis.  She denied having any recent weight loss or night sweats.  She has no nausea, vomiting, diarrhea or constipation.  She has no headache or visual changes.  The patient had repeat CT scan of the chest performed recently and she is here for evaluation and discussion of her risk her results.  MEDICAL HISTORY: Past Medical History:  Diagnosis Date   Allergic rhinitis    Allergy    Anemia    prior to hysterectomy--1997   Arthritis    GERD (gastroesophageal reflux disease)    Heart murmur    Hemorrhoid 2011   History of shingles 04/2014   Kidney stones    Lung cancer ([facility])    Nodule of lower lobe of right lung    Osteoporosis    Varicose veins of left lower  extremity     ALLERGIES:  is allergic to adhesive [tape].  MEDICATIONS:  Current Outpatient Medications  Medication Sig Dispense Refill   aspirin 325 MG EC tablet Take 650 mg by mouth daily as needed for pain.     azelastine (OPTIVAR) 0.05 % ophthalmic solution Place 1 drop into both eyes daily as needed (irritation).     calcium carbonate (OS-CAL) 600 MG TABS tablet Take 1,200 mg by mouth every evening.      Cholecalciferol (VITAMIN D) 50 MCG (2000 UT) tablet Take 2,000 Units by mouth at bedtime.     diclofenac Sodium (VOLTAREN) 1 % GEL APPLY 2-4 GRAMS TO AFFECTED JOINT UP TO 4 TIMES DAILY. 400 g 2   No current facility-administered medications for this visit.    SURGICAL HISTORY:  Past Surgical History:  Procedure Laterality Date   ABDOMINAL HYSTERECTOMY  [facility]  9/11   8/27 had a follow up procedure.     IR THORACENTESIS ASP PLEURAL SPACE W/IMG GUIDE  10/24/2018   LIPOMA EXCISION  1975   neck, left breast and righ jaw.   VIDEO ASSISTED THORACOSCOPY (VATS)/WEDGE RESECTION Right 10/09/2018   Procedure: VIDEO ASSISTED THORACOSCOPY (VATS)/WEDGE RESECTION;  Surgeon: Melrose Nakayama, MD;  Location: [facility];  Service: Thoracic;  Laterality: Right;   WRIST FRACTURE SURGERY      REVIEW OF SYSTEMS:  A comprehensive review of systems was negative except for:  Neurological: positive for paresthesia   PHYSICAL EXAMINATION: General appearance: alert, cooperative and no distress Head: Normocephalic, without obvious abnormality, atraumatic Neck: no adenopathy, no JVD, supple, symmetrical, trachea midline and thyroid not enlarged, symmetric, no tenderness/mass/nodules Lymph nodes: Cervical, supraclavicular, and axillary nodes normal. Resp: clear to auscultation bilaterally Back: symmetric, no curvature. ROM normal. No CVA tenderness. Cardio: regular rate and rhythm, S1, S2 normal, no murmur, click, rub or gallop GI: soft, non-tender;  bowel sounds normal; no masses,  no organomegaly Extremities: extremities normal, atraumatic, no cyanosis or edema  ECOG PERFORMANCE STATUS: 1 - Symptomatic but completely ambulatory  Blood pressure (!) 145/87, pulse 89, temperature (!) 96.8 F (36 C), temperature source Temporal, resp. rate 18, height '5\' 9"'  (1.753 m), weight 230 lb 1.6 oz (104.4 kg), last menstrual period 05/23/1995, SpO2 97 %.  LABORATORY DATA: Lab Results  Component Value Date   WBC 6.5 10/21/2019   HGB 12.7 10/21/2019   HCT 37.2 10/21/2019   MCV 91.2 10/21/2019   PLT 240 10/21/2019      Chemistry      Component Value Date/Time   NA 141 10/21/2019 1505   K 3.4 (L) 10/21/2019 1505   CL 106 10/21/2019 1505   CO2 24 10/21/2019 1505   BUN 14 10/21/2019 1505   CREATININE 1.15 (H) 10/21/2019 1505   CREATININE 0.75 07/14/2014 1231      Component Value Date/Time   CALCIUM 10.0 10/21/2019 1505   CALCIUM 10.1 12/28/2009 2231   ALKPHOS 82 10/21/2019 1505   AST 15 10/21/2019 1505   ALT 15 10/21/2019 1505   BILITOT 0.7 10/21/2019 1505       RADIOGRAPHIC STUDIES: CT Chest W Contrast  Result Date: 10/22/2019 CLINICAL DATA:  Follow-up right lung non-small cell lung carcinoma. Completed chemotherapy. Restaging. EXAM: CT CHEST WITH CONTRAST TECHNIQUE: Multidetector CT imaging of the chest was performed during intravenous contrast administration. CONTRAST:  53m OMNIPAQUE IOHEXOL 300 MG/ML  SOLN COMPARISON:  06/24/2019 FINDINGS: Cardiovascular:  No acute findings. Aortic atherosclerosis noted. Mediastinum/Nodes: No masses or pathologically enlarged lymph nodes identified. Lungs/Pleura: Stable postop changes of scarring and volume loss are seen in the right lower lobe. No suspicious nodules or masses identified. No evidence of pulmonary infiltrate or pleural effusion. Upper Abdomen:  Unremarkable. Musculoskeletal:  No suspicious bone lesions. IMPRESSION: Stable postop changes in right lower lobe. No evidence of recurrent or  metastatic carcinoma within the thorax. Aortic Atherosclerosis (ICD10-I70.0). Electronically Signed   By: JMarlaine HindM.D.   On: 10/22/2019 12:57    ASSESSMENT AND PLAN: This is a very pleasant 74years old African-American female with stage IIb (T3, N0, M0) non-small cell lung cancer, adenocarcinoma with multifocal disease in the right lower lobe status post wedge resection x3 of the right lower lobe with lymph node sampling in May 2020. The patient has no actionable mutations and she has negative PDL 1 expression. She underwent 4 cycles of adjuvant systemic chemotherapy with cisplatin and Alimta.  She tolerated her treatment well with no concerning complaints except for fatigue. The patient is currently on observation and she is feeling fine except for the mild neuropathy in her toes. She had repeat CT scan of the chest performed recently.  I personally and independently reviewed the scans and discussed the results with the patient today. Her scan showed no concerning findings for disease recurrence or metastasis. I recommended for her to continue on observation with repeat CT scan of the chest in 6 months. For the hypertension the patient was  advised to monitor her blood pressure closely at home and to take her medication as prescribed. She was advised to call immediately if she has any concerning symptoms in the interval. The patient voices understanding of current disease status and treatment options and is in agreement with the current care plan. All questions were answered. The patient knows to call the clinic with any problems, questions or concerns. We can certainly see the patient much sooner if necessary.  Disclaimer: This note was dictated with voice recognition software. Similar sounding words can inadvertently be transcribed and may not be corrected upon review.

## 2019-11-12 DIAGNOSIS — I83893 Varicose veins of bilateral lower extremities with other complications: Secondary | ICD-10-CM | POA: Diagnosis not present

## 2019-11-12 DIAGNOSIS — R600 Localized edema: Secondary | ICD-10-CM | POA: Diagnosis not present

## 2019-11-12 DIAGNOSIS — I872 Venous insufficiency (chronic) (peripheral): Secondary | ICD-10-CM | POA: Diagnosis not present

## 2019-12-17 ENCOUNTER — Ambulatory Visit
Admission: RE | Admit: 2019-12-17 | Discharge: 2019-12-17 | Disposition: A | Discharge status: Home / Self Care | Payer: BC Managed Care – PPO | Source: Ambulatory Visit | Attending: Internal Medicine | Admitting: Internal Medicine

## 2019-12-17 DIAGNOSIS — M810 Age-related osteoporosis without current pathological fracture: Secondary | ICD-10-CM

## 2019-12-17 DIAGNOSIS — Z1231 Encounter for screening mammogram for malignant neoplasm of breast: Secondary | ICD-10-CM

## 2019-12-17 DIAGNOSIS — Z780 Asymptomatic menopausal state: Secondary | ICD-10-CM | POA: Diagnosis not present

## 2019-12-25 IMAGING — MR MRI HEAD WITHOUT AND WITH CONTRAST
9 of 13 series · 31 of 48 positions shown · IV contrast (gadavist)
Comparison: None.

CLINICAL DATA: Non-small cell lung cancer staging.

EXAM:
MRI HEAD WITHOUT AND WITH CONTRAST
TECHNIQUE: Multiplanar, multiecho pulse sequences of the brain and surrounding
structures were obtained without and with intravenous contrast.
CONTRAST:  9 mL Gadavist

[Series 3: DWI · axial · 3.0mm · 1.09mm/px · z∈[-57,+100]mm · 6 of 108 slices shown (1 of 4)]
[im 1/108]
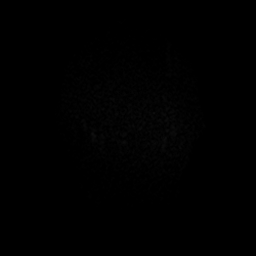
[im 22/108]
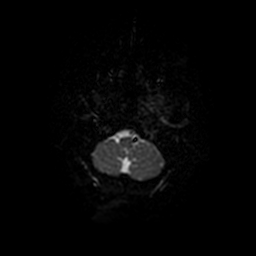
[im 43/108]
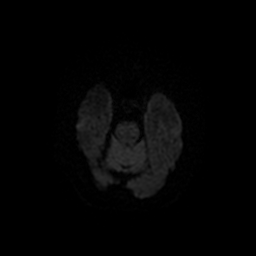
[im 65/108]
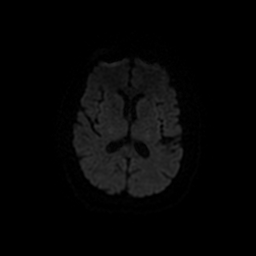
[im 86/108]
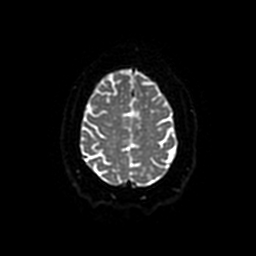
[im 108/108]
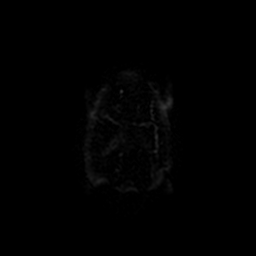

[Series 5: T2 · axial · 5.0mm · 0.43mm/px · 1 of 24 slices shown]
[im 1/24]
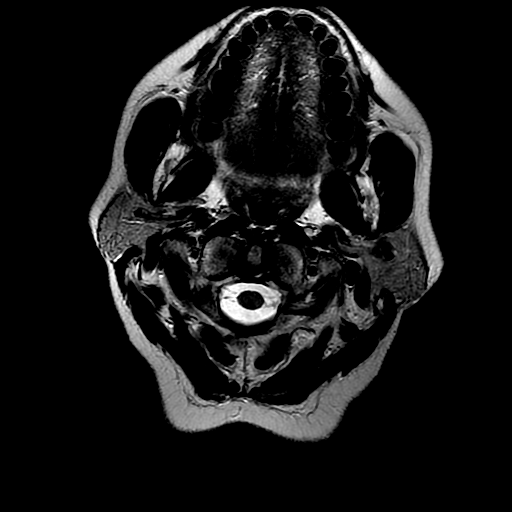

[Series 6: FLAIR · axial · 3.0mm · 0.43mm/px · z∈[-41,+114]mm · 2 of 27 slices shown]
[im 1/27]
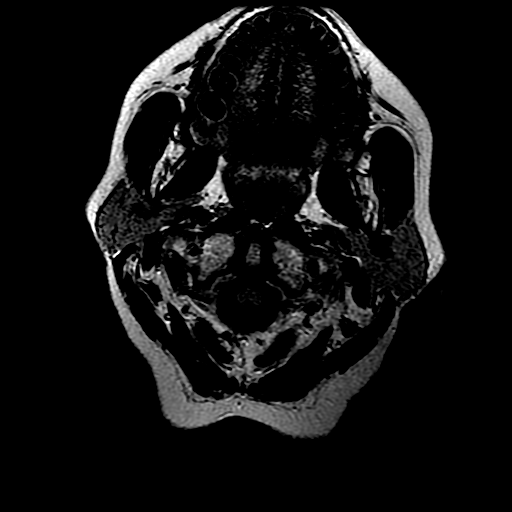
[im 27/27]
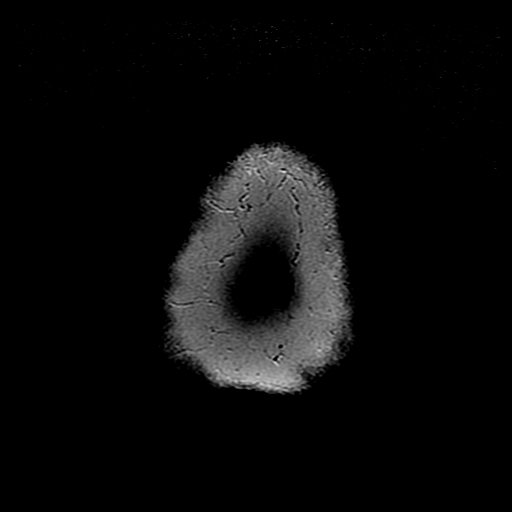

[Series 9: DWI · coronal · 4.0mm · 1.09mm/px · 5 of 86 slices shown (2 of 4)]
[im 1/86]
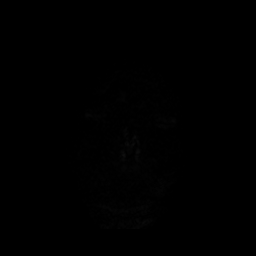
[im 22/86]
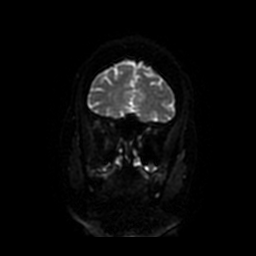
[im 43/86]
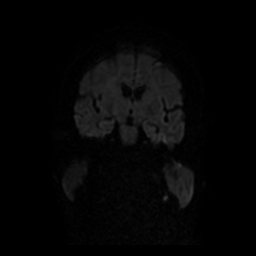
[im 64/86]
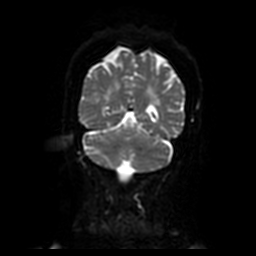
[im 86/86]
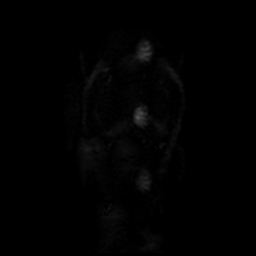

[Series 11: T1 post-contrast · axial · 1.0mm · 0.47mm/px · z∈[-57,+108]mm · 8 of 168 slices shown (1 of 3)]
[im 1/168]
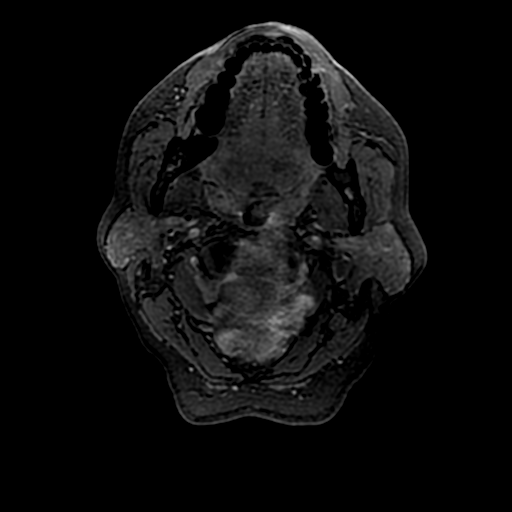
[im 19/168]
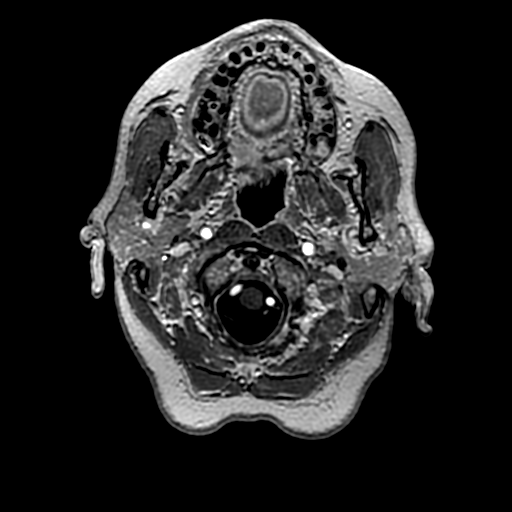
[im 56/168]
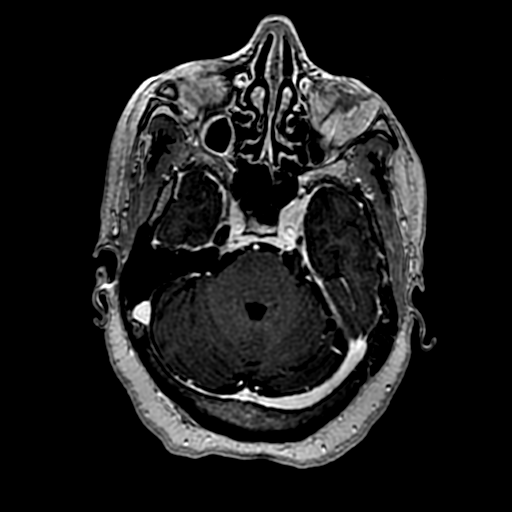
[im 75/168]
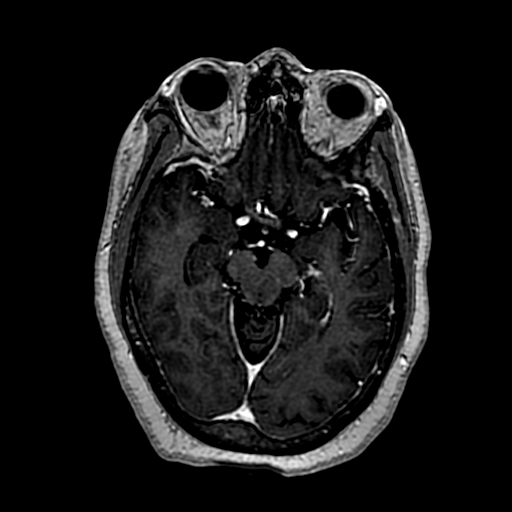
[im 93/168]
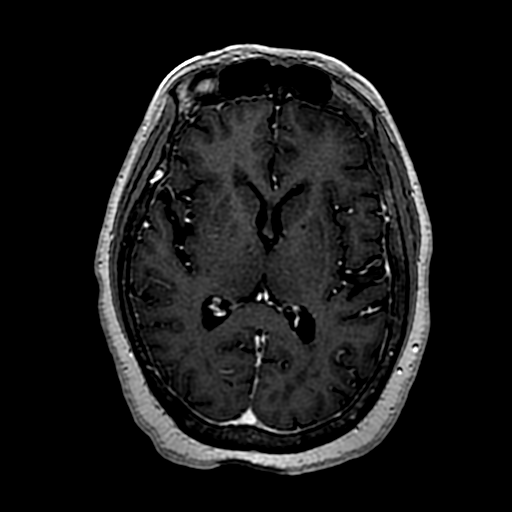
[im 112/168]
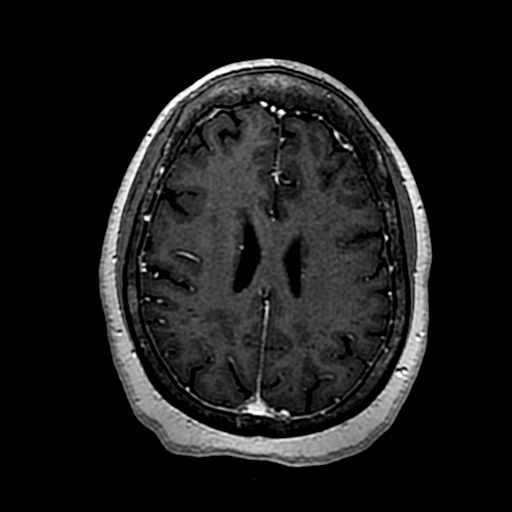
[im 149/168]
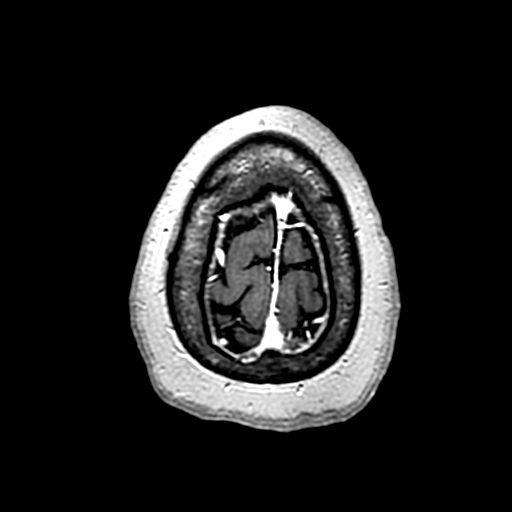
[im 168/168]
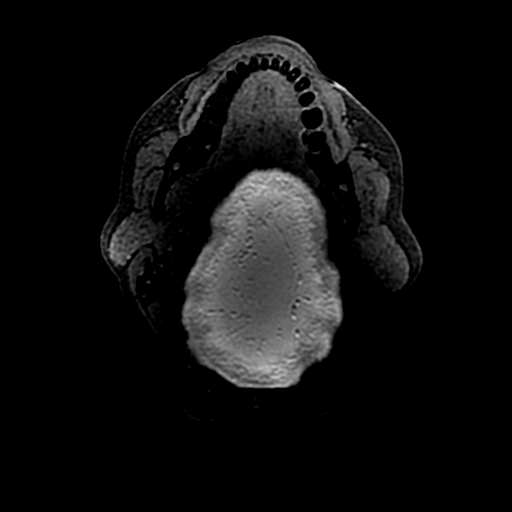

[Series 12: T1 post-contrast · coronal · 5.0mm · 0.43mm/px · 2 of 27 slices shown (2 of 3)]
[im 1/27]
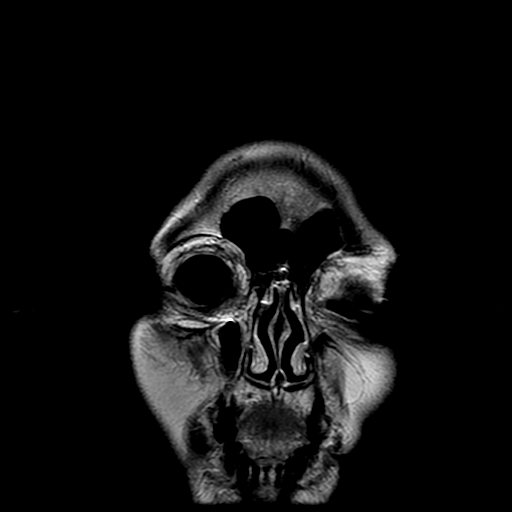
[im 27/27]
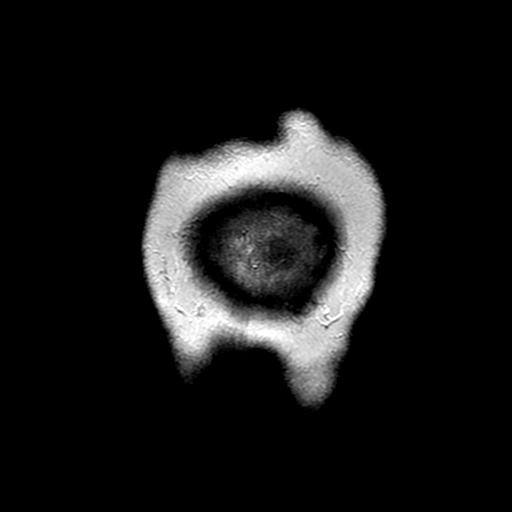

[Series 13: T1 post-contrast · sagittal · 5.0mm · 0.47mm/px · 1 of 24 slices shown (3 of 3)]
[im 1/24]
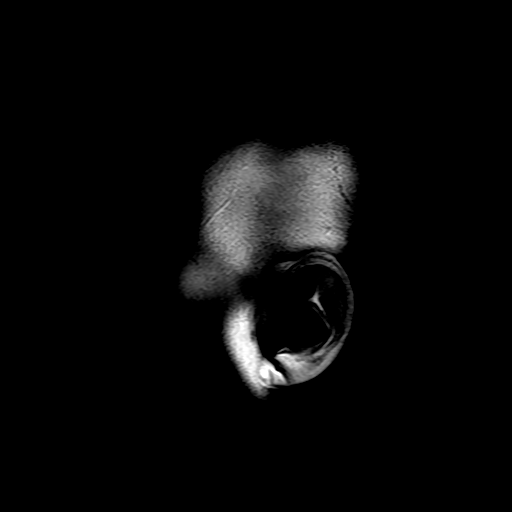

[Series 300: DWI · axial · 3.0mm · 1.09mm/px · z∈[-57,+100]mm · 3 of 54 slices shown (3 of 4)]
[im 1/54]
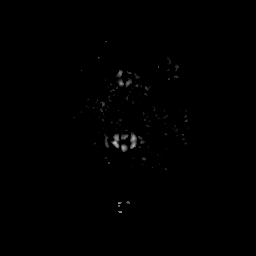
[im 27/54]
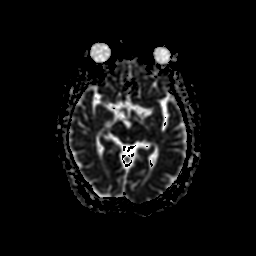
[im 54/54]
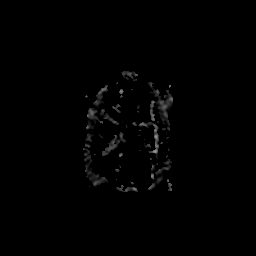

[Series 900: DWI · coronal · 4.0mm · 1.09mm/px · 3 of 43 slices shown (4 of 4)]
[im 1/43]
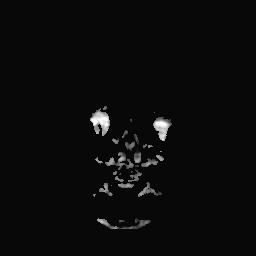
[im 22/43]
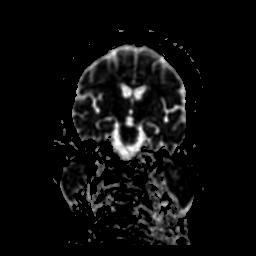
[im 43/43]
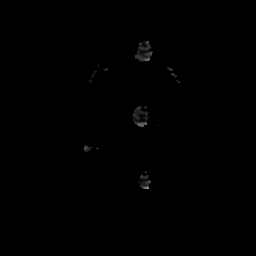

[31 of 48 positions shown; findings below may reference images not displayed]

FINDINGS: BRAIN: There is no acute infarct, acute hemorrhage or extra-axial
collection. A partially empty sella is incidentally noted. No
midline shift or other mass effect. Multifocal white matter
hyperintensity, most commonly due to chronic ischemic
microangiopathy. The cerebral and cerebellar volume are
age-appropriate. No hydrocephalus. Susceptibility-sensitive
sequences show no chronic microhemorrhage or superficial siderosis.
No mass lesion.

VASCULAR: The major intracranial arterial and venous sinus flow
voids are normal.

SKULL AND UPPER CERVICAL SPINE: Calvarial bone marrow signal is
normal. There is no skull base mass. Visualized upper cervical spine
and soft tissues are normal.

SINUSES/ORBITS: No fluid levels or advanced mucosal thickening. No
mastoid or middle ear effusion. The orbits are normal.
IMPRESSION: No intracranial or calvarial metastatic disease.

## 2020-01-27 NOTE — Progress Notes (Signed)
Office Visit Note  Patient: Stephanie Crawford             Date of Birth: 1945-11-08           MRN: [id number]             PCP: [name], MD Referring: [facility]* Visit Date: 02/05/2020 Occupation: @GUAROCC @  Subjective:  Medication management   History of Present Illness: Stephanie Crawford is a 74 y.o. female with history of osteoporosis and osteoarthritis.  She came to discuss her bone density results and treatment options.  She states she continues to have some discomfort in her joints from osteoarthritis.  She has discomfort in her shoulders, knee joints mostly.  She does not have much discomfort in her hands or feet.  Continues to have some lower back pain.  She believes that she may have a component of fibromyalgia since she was diagnosed with lung cancer.  Activities of Daily Living:  Patient reports morning stiffness for 30   minutes.   Patient Reports nocturnal pain.  Difficulty dressing/grooming: Denies Difficulty climbing stairs: Denies Difficulty getting out of chair: Denies Difficulty using hands for taps, buttons, cutlery, and/or writing: Reports  Review of Systems  Constitutional: Positive for fatigue.  HENT: Positive for mouth dryness. Negative for mouth sores and nose dryness.   Eyes: Negative for itching and dryness.  Respiratory: Positive for shortness of breath. Negative for difficulty breathing.   Cardiovascular: Negative for palpitations and irregular heartbeat.  Gastrointestinal: Negative for blood in stool, constipation and diarrhea.  Endocrine: Negative for increased urination.  Genitourinary: Negative for difficulty urinating.  Musculoskeletal: Positive for arthralgias, joint pain, myalgias, morning stiffness, muscle tenderness and myalgias. Negative for joint swelling.  Skin: Negative for color change, rash and redness.  Allergic/Immunologic: Negative for susceptible to infections.  Neurological: Positive for numbness.  Negative for dizziness, headaches, memory loss and weakness.  Hematological: Negative for bruising/bleeding tendency.  Psychiatric/Behavioral: Positive for sleep disturbance. Negative for confusion.    PMFS History:  Patient Active Problem List   Diagnosis Date Noted  . Hypertension 02/24/2019  . Goals of care, counseling/discussion 11/14/2018  . Adenocarcinoma of right lung, stage 2 ([facility]) 10/25/2018  . Encounter for antineoplastic chemotherapy 10/25/2018  . S/P partial lobectomy of lung 10/09/2018  . Primary osteoarthritis of both knees 05/17/2017  . Primary osteoarthritis of both hands 05/17/2017  . DDD (degenerative disc disease), lumbar 05/17/2017  . History of gastroesophageal reflux (GERD) 05/17/2017  . History of shingles 05/17/2017  . History of cellulitis 05/17/2017  . Multiple lung nodules on CT 11/30/2016  . Cough 11/30/2016  . Right knee pain 04/28/2015  . Foot ulcer ([facility]) 07/15/2014  . Osteoporosis 01/30/2013  . Vitamin D deficiency 01/13/2013  . Other and unspecified hyperlipidemia 01/06/2013  . Breast thickening 01/06/2013  . Skin lesion of right leg 09/19/2012  . Chronic seasonal allergic rhinitis 09/19/2012  . Elevated bilirubin 07/12/2012  . History of cardiac murmur 07/12/2012  . Rash and nonspecific skin eruption 07/11/2012  . Rectal bleeding 05/11/2011  . Abnormal EKG 03/01/2011  . General medical examination 03/01/2011  . GERD 12/08/2009  . ANKLE EDEMA 12/08/2009    Past Medical History:  Diagnosis Date  . Allergic rhinitis   . Allergy   . Anemia    prior to hysterectomy--1997  . Arthritis   . GERD (gastroesophageal reflux disease)   . Heart murmur   . Hemorrhoid 2011  . History of shingles 04/2014  . Kidney stones   .  Lung cancer ([facility])   . Nodule of lower lobe of right lung   . Osteoporosis   . Varicose veins of left lower extremity     Family History  Problem Relation Age of Onset  . Prostate cancer Father   . Lung cancer Maternal  Grandmother   . Asthma Daughter   . Asthma Sister   . Heart attack Brother   . Colon cancer Neg Hx   . Esophageal cancer Neg Hx   . Pancreatic cancer Neg Hx   . Stomach cancer Neg Hx   . Liver disease Neg Hx   . Rectal cancer Neg Hx    Past Surgical History:  Procedure Laterality Date  . ABDOMINAL HYSTERECTOMY  1997  . APPENDECTOMY  1960  . HEMORRHOID SURGERY  9/11   8/27 had a follow up procedure.    . IR THORACENTESIS ASP PLEURAL SPACE W/IMG GUIDE  10/24/2018  . LIPOMA EXCISION  1975   neck, left breast and righ jaw.  Marland Kitchen VIDEO ASSISTED THORACOSCOPY (VATS)/WEDGE RESECTION Right 10/09/2018   Procedure: VIDEO ASSISTED THORACOSCOPY (VATS)/WEDGE RESECTION;  Surgeon: Melrose Nakayama, MD;  Location: [facility];  Service: Thoracic;  Laterality: Right;  . WRIST FRACTURE SURGERY     Social History   Social History Narrative   [facility] (11/30/16):   Originally from [place], [place]. Grew up primarily in [place], [place]. She has also lived in [place]. She moved to [facility] in 2006. 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 Recently has acquired an outside dog. No bird exposure. Possible mold problem in her current home with history of water damage. No hot tub exposure. Enjoys babysitting her grandsons. Remote travel to [place], [place], [place], [place], [place], [place], [place], [place], [place], & [place].    Immunization History  Administered Date(s) Administered  . PFIZER SARS-COV-2 Vaccination 07/17/2019, 08/13/2019  . Td 06/22/2009  . Tdap 01/06/2013     Objective: Vital Signs: BP 129/83 (BP Location: Left Arm, Patient Position: Sitting, Cuff Size: Normal)   Pulse 78   Resp 16   Ht 5\' 8"  (1.727 m)   Wt 234 lb 12.8 oz (106.5 kg)   LMP 05/23/1995   BMI 35.70 kg/m    Physical Exam Vitals and nursing note reviewed.  Constitutional:      Appearance:  She is well-developed.  HENT:     Head: Normocephalic and atraumatic.  Eyes:     Conjunctiva/sclera: Conjunctivae normal.  Cardiovascular:     Rate and Rhythm: Normal rate and regular rhythm.     Heart sounds: Normal heart sounds.  Pulmonary:     Effort: Pulmonary effort is normal.     Breath sounds: Normal breath sounds.  Abdominal:     General: Bowel sounds are normal.     Palpations: Abdomen is soft.  Musculoskeletal:     Cervical back: Normal range of motion.  Lymphadenopathy:     Cervical: No cervical adenopathy.  Skin:    General: Skin is warm and dry.     Capillary Refill: Capillary refill takes less than 2 seconds.  Neurological:     Mental Status: She is alert and oriented to person, place, and time.  Psychiatric:        Behavior: Behavior normal.      Musculoskeletal Exam: C-spine was in good range of motion.  She is some discomfort range of motion of her lumbar spine.  She had good range of motion of her shoulder joints, elbow joints, wrist joints.  No synovitis over MCPs or PIPs was noted.  Hip joints were knee joints with good range of motion.  She is some discomfort range of motion of her knee joints.  CDAI Exam: CDAI Score: -- Patient Global: --; Provider Global: -- Swollen: --; Tender: -- Joint Exam 02/05/2020   No joint exam has been documented for this visit   There is currently no information documented on the homunculus. Go to the Rheumatology activity and complete the homunculus joint exam.  Investigation: No additional findings.  Imaging: No results found.  Recent Labs: Lab Results  Component Value Date   WBC 6.5 10/21/2019   HGB 12.7 10/21/2019   PLT 240 10/21/2019   NA 141 10/21/2019   K 3.4 (L) 10/21/2019   CL 106 10/21/2019   CO2 24 10/21/2019   GLUCOSE 113 (H) 10/21/2019   BUN 14 10/21/2019   CREATININE 1.15 (H) 10/21/2019   BILITOT 0.7 10/21/2019   ALKPHOS 82 10/21/2019   AST 15 10/21/2019   ALT 15 10/21/2019   PROT 6.7  10/21/2019   ALBUMIN 3.8 10/21/2019   CALCIUM 10.0 10/21/2019   GFRAA 55 (L) 10/21/2019    Speciality Comments: No specialty comments available.  Procedures:  No procedures performed Allergies: Adhesive [tape]   Assessment / Plan:     Visit Diagnoses: Age-related osteoporosis without current pathological fracture - previously treated with Boniva and Fosamax.  December 17, 2019 left femoral neck BMD 0.647, T score -2.9.  PA detailed discussion regarding the DEXA results.  Treatment options and their side effects were discussed.  Patient states she has it is prescription for Fosamax and will try it.  She has taken Fosamax and Boniva in the past which she recalls maybe 15 years ago.  Indications side effects contraindications of Fosamax were discussed at length.  Her GFR is mildly decreased.  She should get labs every 3 months which should include CMP with GFR.  If she has an adequate response to Fosamax then she may benefit from Prolia in the future.  History of vitamin D deficiency-she is on vitamin D supplement.  Primary osteoarthritis of both hands-joint protection muscle strengthening was discussed.  Primary osteoarthritis of both knees - moderate OA and moderate chondromalacia patella.  Exercise and dietary modifications were discussed.  She had benefit from Voltaren gel.  Per her request prescription refill was given.  Side effects were discussed.  Trochanteric bursitis of both hips-IT band stretches were demonstrated and discussed.  DDD (degenerative disc disease), lumbar-she continues to have some lower back pain.  Adenocarcinoma of right lung, stage 2 ([facility]) - diagnosed with adenocarcinoma stage 2.  She had a partial lobectomy of the right lung and has been undergoing chemotherapy.  S/P partial lobectomy of lung  History of cardiac murmur  History of gastroesophageal reflux (GERD)  History of cellulitis  History of shingles  Educated about COVID-19 virus infection-she is fully  immunized against COVID-19.  I have discussed use of booster once available to her.  Use of mask, social distancing and hand hygiene was discussed.  Orders: No orders of the defined types were placed in this encounter.  Meds ordered this encounter  Medications  . diclofenac Sodium (VOLTAREN) 1 % GEL    Sig: Apply 2-4 grams to affected joint 4 times daily as needed.    Dispense:  400 g    Refill:  2      Follow-Up  Instructions: Return in about 6 months (around 08/04/2020) for Osteoporosis.   Bo Merino, MD  Note - This record has been created using Editor, commissioning.  Chart creation errors have been sought, but may not always  have been located. Such creation errors do not reflect on  the standard of medical care.

## 2020-02-05 ENCOUNTER — Encounter: Payer: Self-pay | Admitting: Rheumatology

## 2020-02-05 ENCOUNTER — Ambulatory Visit (INDEPENDENT_AMBULATORY_CARE_PROVIDER_SITE_OTHER): Payer: BC Managed Care – PPO | Admitting: Rheumatology

## 2020-02-05 ENCOUNTER — Other Ambulatory Visit: Payer: Self-pay

## 2020-02-05 VITALS — BP 129/83 | HR 78 | Resp 16 | Ht 68.0 in | Wt 234.8 lb

## 2020-02-05 DIAGNOSIS — Z7189 Other specified counseling: Secondary | ICD-10-CM

## 2020-02-05 DIAGNOSIS — M19042 Primary osteoarthritis, left hand: Secondary | ICD-10-CM

## 2020-02-05 DIAGNOSIS — M19041 Primary osteoarthritis, right hand: Secondary | ICD-10-CM

## 2020-02-05 DIAGNOSIS — M7062 Trochanteric bursitis, left hip: Secondary | ICD-10-CM

## 2020-02-05 DIAGNOSIS — M170 Bilateral primary osteoarthritis of knee: Secondary | ICD-10-CM | POA: Diagnosis not present

## 2020-02-05 DIAGNOSIS — Z902 Acquired absence of lung [part of]: Secondary | ICD-10-CM

## 2020-02-05 DIAGNOSIS — Z8619 Personal history of other infectious and parasitic diseases: Secondary | ICD-10-CM

## 2020-02-05 DIAGNOSIS — Z8679 Personal history of other diseases of the circulatory system: Secondary | ICD-10-CM

## 2020-02-05 DIAGNOSIS — Z8639 Personal history of other endocrine, nutritional and metabolic disease: Secondary | ICD-10-CM

## 2020-02-05 DIAGNOSIS — M7061 Trochanteric bursitis, right hip: Secondary | ICD-10-CM

## 2020-02-05 DIAGNOSIS — Z872 Personal history of diseases of the skin and subcutaneous tissue: Secondary | ICD-10-CM

## 2020-02-05 DIAGNOSIS — M810 Age-related osteoporosis without current pathological fracture: Secondary | ICD-10-CM | POA: Diagnosis not present

## 2020-02-05 DIAGNOSIS — C3491 Malignant neoplasm of unspecified part of right bronchus or lung: Secondary | ICD-10-CM

## 2020-02-05 DIAGNOSIS — Z8719 Personal history of other diseases of the digestive system: Secondary | ICD-10-CM

## 2020-02-05 DIAGNOSIS — M5136 Other intervertebral disc degeneration, lumbar region: Secondary | ICD-10-CM

## 2020-02-05 DIAGNOSIS — M51369 Other intervertebral disc degeneration, lumbar region without mention of lumbar back pain or lower extremity pain: Secondary | ICD-10-CM

## 2020-02-05 MED ORDER — DICLOFENAC SODIUM 1 % EX GEL: CUTANEOUS | 2 refills | Status: DC

## 2020-02-22 ENCOUNTER — Other Ambulatory Visit: Payer: Self-pay | Admitting: Rheumatology

## 2020-02-23 NOTE — Telephone Encounter (Signed)
Last Visit: 02/05/2020 Next Visit: 08/05/2020  Okay to refill per Dr. Estanislado Pandy

## 2020-04-03 ENCOUNTER — Ambulatory Visit: Payer: BC Managed Care – PPO | Attending: Internal Medicine

## 2020-04-03 DIAGNOSIS — Z23 Encounter for immunization: Secondary | ICD-10-CM

## 2020-04-03 NOTE — Progress Notes (Signed)
   Covid-19 Vaccination Clinic  Name:  Stephanie Crawford    MRN: [id number] DOB: 18-Jul-1945  04/03/2020  [name] was observed post Covid-19 immunization for 15 minutes without incident. She was provided with Vaccine Information Sheet and instruction to access the V-Safe system.   [name] was instructed to call 911 with any severe reactions post vaccine: Marland Kitchen Difficulty breathing  . Swelling of face and throat  . A fast heartbeat  . A bad rash all over body  . Dizziness and weakness   Immunizations Administered    Name Date Dose VIS Date Route   Pfizer COVID-19 Vaccine 04/03/2020  4:15 PM 0.3 mL 03/10/2020 Intramuscular   Manufacturer: [place]   Lot: [id number]   [place]: [id number]

## 2020-04-23 ENCOUNTER — Inpatient Hospital Stay: Payer: BC Managed Care – PPO

## 2020-04-26 ENCOUNTER — Other Ambulatory Visit: Payer: Self-pay

## 2020-04-26 ENCOUNTER — Inpatient Hospital Stay: Payer: BC Managed Care – PPO

## 2020-04-26 ENCOUNTER — Ambulatory Visit (HOSPITAL_COMMUNITY): Payer: BC Managed Care – PPO

## 2020-04-27 ENCOUNTER — Inpatient Hospital Stay: Payer: BC Managed Care – PPO | Admitting: Internal Medicine

## 2020-04-27 DIAGNOSIS — J069 Acute upper respiratory infection, unspecified: Secondary | ICD-10-CM | POA: Diagnosis not present

## 2020-04-28 ENCOUNTER — Ambulatory Visit
Admission: RE | Admit: 2020-04-28 | Discharge: 2020-04-28 | Disposition: A | Discharge status: Home / Self Care | Payer: BC Managed Care – PPO | Source: Ambulatory Visit | Attending: Internal Medicine | Admitting: Internal Medicine

## 2020-04-28 ENCOUNTER — Other Ambulatory Visit: Payer: Self-pay | Admitting: Internal Medicine

## 2020-04-28 DIAGNOSIS — J069 Acute upper respiratory infection, unspecified: Secondary | ICD-10-CM

## 2020-04-28 DIAGNOSIS — R059 Cough, unspecified: Secondary | ICD-10-CM | POA: Diagnosis not present

## 2020-05-06 ENCOUNTER — Ambulatory Visit (HOSPITAL_COMMUNITY)
Admission: RE | Admit: 2020-05-06 | Discharge: 2020-05-06 | Disposition: A | Discharge status: Home / Self Care | Payer: BC Managed Care – PPO | Source: Ambulatory Visit | Attending: Internal Medicine | Admitting: Internal Medicine

## 2020-05-06 ENCOUNTER — Inpatient Hospital Stay: Payer: BC Managed Care – PPO | Attending: Internal Medicine

## 2020-05-06 ENCOUNTER — Encounter (HOSPITAL_COMMUNITY): Payer: Self-pay

## 2020-05-06 ENCOUNTER — Other Ambulatory Visit: Payer: Self-pay

## 2020-05-06 DIAGNOSIS — Z8719 Personal history of other diseases of the digestive system: Secondary | ICD-10-CM | POA: Insufficient documentation

## 2020-05-06 DIAGNOSIS — M47814 Spondylosis without myelopathy or radiculopathy, thoracic region: Secondary | ICD-10-CM | POA: Diagnosis not present

## 2020-05-06 DIAGNOSIS — C3490 Malignant neoplasm of unspecified part of unspecified bronchus or lung: Secondary | ICD-10-CM | POA: Diagnosis not present

## 2020-05-06 DIAGNOSIS — I2510 Atherosclerotic heart disease of native coronary artery without angina pectoris: Secondary | ICD-10-CM | POA: Diagnosis not present

## 2020-05-06 DIAGNOSIS — C3431 Malignant neoplasm of lower lobe, right bronchus or lung: Secondary | ICD-10-CM | POA: Insufficient documentation

## 2020-05-06 DIAGNOSIS — I700 Atherosclerosis of aorta: Secondary | ICD-10-CM | POA: Diagnosis not present

## 2020-05-06 DIAGNOSIS — Z9221 Personal history of antineoplastic chemotherapy: Secondary | ICD-10-CM | POA: Insufficient documentation

## 2020-05-06 LAB — CBC WITH DIFFERENTIAL (CANCER CENTER ONLY)
Abs Immature Granulocytes: 0.03 10*3/uL (ref 0.00–0.07)
Basophils Absolute: 0.1 10*3/uL (ref 0.0–0.1)
Basophils Relative: 2 %
Eosinophils Absolute: 0.2 10*3/uL (ref 0.0–0.5)
Eosinophils Relative: 3 %
HCT: 39.3 % (ref 36.0–46.0)
Hemoglobin: 12.9 g/dL (ref 12.0–15.0)
Immature Granulocytes: 1 %
Lymphocytes Relative: 40 %
Lymphs Abs: 2.6 10*3/uL (ref 0.7–4.0)
MCH: 30.1 pg (ref 26.0–34.0)
MCHC: 32.8 g/dL (ref 30.0–36.0)
MCV: 91.6 fL (ref 80.0–100.0)
Monocytes Absolute: 0.6 10*3/uL (ref 0.1–1.0)
Monocytes Relative: 10 %
Neutro Abs: 3 10*3/uL (ref 1.7–7.7)
Neutrophils Relative %: 44 %
Platelet Count: 337 10*3/uL (ref 150–400)
RBC: 4.29 MIL/uL (ref 3.87–5.11)
RDW: 12.6 % (ref 11.5–15.5)
WBC Count: 6.5 10*3/uL (ref 4.0–10.5)
nRBC: 0 % (ref 0.0–0.2)

## 2020-05-06 LAB — CMP (CANCER CENTER ONLY)
ALT: 23 U/L (ref 0–44)
AST: 26 U/L (ref 15–41)
Albumin: 3.9 g/dL (ref 3.5–5.0)
Alkaline Phosphatase: 85 U/L (ref 38–126)
Anion gap: 10 (ref 5–15)
BUN: 13 mg/dL (ref 8–23)
CO2: 25 mmol/L (ref 22–32)
Calcium: 9.8 mg/dL (ref 8.9–10.3)
Chloride: 107 mmol/L (ref 98–111)
Creatinine: 1.06 mg/dL — ABNORMAL HIGH (ref 0.44–1.00)
GFR, Estimated: 55 mL/min — ABNORMAL LOW (ref >60)
Glucose, Bld: 97 mg/dL (ref 70–99)
Potassium: 3.5 mmol/L (ref 3.5–5.1)
Sodium: 142 mmol/L (ref 135–145)
Total Bilirubin: 1.3 mg/dL — ABNORMAL HIGH (ref 0.3–1.2)
Total Protein: 7.3 g/dL (ref 6.5–8.1)

## 2020-05-06 MED ORDER — SODIUM CHLORIDE (PF) 0.9 % IJ SOLN
INTRAMUSCULAR | Status: AC
  Filled 2020-05-06: qty 50

## 2020-05-06 MED ORDER — IOHEXOL 300 MG/ML  SOLN
75.0000 mL | Freq: Once | INTRAMUSCULAR | Status: AC | PRN
  Administered 2020-05-06: 16:00:00 75 mL via INTRAVENOUS

## 2020-05-10 ENCOUNTER — Other Ambulatory Visit: Payer: Self-pay

## 2020-05-10 ENCOUNTER — Encounter: Payer: Self-pay | Admitting: Internal Medicine

## 2020-05-10 ENCOUNTER — Inpatient Hospital Stay (HOSPITAL_BASED_OUTPATIENT_CLINIC_OR_DEPARTMENT_OTHER): Payer: BC Managed Care – PPO | Admitting: Internal Medicine

## 2020-05-10 VITALS — BP 145/85 | HR 85 | Temp 96.0°F | Resp 18 | Ht 68.0 in | Wt 233.6 lb

## 2020-05-10 DIAGNOSIS — C3490 Malignant neoplasm of unspecified part of unspecified bronchus or lung: Secondary | ICD-10-CM | POA: Diagnosis not present

## 2020-05-10 DIAGNOSIS — I700 Atherosclerosis of aorta: Secondary | ICD-10-CM | POA: Diagnosis not present

## 2020-05-10 DIAGNOSIS — C3431 Malignant neoplasm of lower lobe, right bronchus or lung: Secondary | ICD-10-CM | POA: Diagnosis not present

## 2020-05-10 DIAGNOSIS — C3491 Malignant neoplasm of unspecified part of right bronchus or lung: Secondary | ICD-10-CM

## 2020-05-10 DIAGNOSIS — Z8719 Personal history of other diseases of the digestive system: Secondary | ICD-10-CM | POA: Diagnosis not present

## 2020-05-10 DIAGNOSIS — Z9221 Personal history of antineoplastic chemotherapy: Secondary | ICD-10-CM | POA: Diagnosis not present

## 2020-05-10 NOTE — Progress Notes (Signed)
    [facility] [phone]   [phone]  OFFICE PROGRESS NOTE  [name], [name], MD [address] [place] [address]  DIAGNOSIS: Stage IIB (T3, N0, M0) non-small cell lung cancer, adenocarcinoma diagnosed in May 2020.  Biomarker Findings Microsatellite status - Cannot Be Determined Tumor Mutational Burden - Cannot Be Determined Genomic Findings For a complete list of the genes assayed, please refer to the Appendix. KRAS G12V TP53 G244V 7 Disease relevant genes with no reportable alterations: ALK, BRAF, EGFR, ERBB2, MET, RET, ROS1   PDL 1 expression: negative   PRIOR THERAPY:  1) Status post wedge resection x3 of the right lower lobe with lymph node sampling under the care of Dr. Hendrickson on 10/09/2018. 2) Adjuvant systemic chemotherapy with cisplatin 75 mg/M2 and Alimta 500 mg/M2 every 3 weeks.  First dose November 22, 2018.  Status post 4 cycles.  Last dose was given on 01/28/2019.  CURRENT THERAPY: Observation.  INTERVAL HISTORY: Stephanie Crawford 74 y.o. female returns to the clinic today for 6 months follow-up visit.  The patient is feeling fine today with no concerning complaints except for persistent dry cough.  She has no chest pain, shortness of breath or hemoptysis.  She denied having any fever or chills.  She has no nausea, vomiting, diarrhea or constipation.  She has no headache or visual changes.  She is currently on observation.  The patient had repeat CT scan of the chest performed recently and she is here for evaluation and discussion of her risk her results.  MEDICAL HISTORY: Past Medical History:  Diagnosis Date  . Allergic rhinitis   . Allergy   . Anemia    prior to hysterectomy--1997  . Arthritis   . GERD (gastroesophageal reflux disease)   . Heart murmur   . Hemorrhoid 2011  . History of shingles 04/2014  . Kidney stones   . Lung cancer (HCC)   . Nodule of lower lobe of right lung   .  Osteoporosis   . Varicose veins of left lower extremity     ALLERGIES:  is allergic to adhesive [tape].  MEDICATIONS:  Current Outpatient Medications  Medication Sig Dispense Refill  . calcium carbonate (OS-CAL) 600 MG TABS tablet Take 1,200 mg by mouth every evening.     . Cholecalciferol (VITAMIN D) 50 MCG (2000 UT) tablet Take 2,000 Units by mouth at bedtime.    . diclofenac Sodium (VOLTAREN) 1 % GEL APPLY 2-4 GRAMS TO AFFECTED JOINT UP TO 4 TIMES DAILY. 400 g 2  . alendronate (FOSAMAX) 70 MG tablet PLEASE SEE ATTACHED FOR DETAILED DIRECTIONS (Patient not taking: Reported on 05/10/2020)    . aspirin 325 MG EC tablet Take 650 mg by mouth daily as needed for pain. (Patient not taking: Reported on 05/10/2020)    . azelastine (OPTIVAR) 0.05 % ophthalmic solution Place 1 drop into both eyes daily as needed (irritation). (Patient not taking: Reported on 05/10/2020)     No current facility-administered medications for this visit.    SURGICAL HISTORY:  Past Surgical History:  Procedure Laterality Date  . ABDOMINAL HYSTERECTOMY  1997  . APPENDECTOMY  1960  . HEMORRHOID SURGERY  9/11   8/27 had a follow up procedure.    . IR THORACENTESIS ASP PLEURAL SPACE W/IMG GUIDE  10/24/2018  . LIPOMA EXCISION  1975   neck, left breast and righ jaw.  . VIDEO ASSISTED THORACOSCOPY (VATS)/WEDGE RESECTION Right 10/09/2018   Procedure: VIDEO ASSISTED THORACOSCOPY (  VATS)/WEDGE RESECTION;  Surgeon: Hendrickson, Steven C, MD;  Location: [facility] OR;  Service: Thoracic;  Laterality: Right;  . WRIST FRACTURE SURGERY      REVIEW OF SYSTEMS:  A comprehensive review of systems was negative except for: Respiratory: positive for cough   PHYSICAL EXAMINATION: General appearance: alert, cooperative and no distress Head: Normocephalic, without obvious abnormality, atraumatic Neck: no adenopathy, no JVD, supple, symmetrical, trachea midline and thyroid not enlarged, symmetric, no tenderness/mass/nodules Lymph nodes:  Cervical, supraclavicular, and axillary nodes normal. Resp: clear to auscultation bilaterally Back: symmetric, no curvature. ROM normal. No CVA tenderness. Cardio: regular rate and rhythm, S1, S2 normal, no murmur, click, rub or gallop GI: soft, non-tender; bowel sounds normal; no masses,  no organomegaly Extremities: extremities normal, atraumatic, no cyanosis or edema  ECOG PERFORMANCE STATUS: 1 - Symptomatic but completely ambulatory  Blood pressure (!) 145/85, pulse 85, temperature (!) 96 F (35.6 C), temperature source Tympanic, resp. rate 18, height 5' 8" (1.727 m), weight 233 lb 9.6 oz (106 kg), last menstrual period 05/23/1995, SpO2 100 %.  LABORATORY DATA: Lab Results  Component Value Date   WBC 6.5 05/06/2020   HGB 12.9 05/06/2020   HCT 39.3 05/06/2020   MCV 91.6 05/06/2020   PLT 337 05/06/2020      Chemistry      Component Value Date/Time   NA 142 05/06/2020 1347   K 3.5 05/06/2020 1347   CL 107 05/06/2020 1347   CO2 25 05/06/2020 1347   BUN 13 05/06/2020 1347   CREATININE 1.06 (H) 05/06/2020 1347   CREATININE 0.75 07/14/2014 1231      Component Value Date/Time   CALCIUM 9.8 05/06/2020 1347   CALCIUM 10.1 12/28/2009 2231   ALKPHOS 85 05/06/2020 1347   AST 26 05/06/2020 1347   ALT 23 05/06/2020 1347   BILITOT 1.3 (H) 05/06/2020 1347       RADIOGRAPHIC STUDIES: DG Chest 2 View  Result Date: 04/28/2020 CLINICAL DATA:  URI with cough, history of lung cancer and VATS EXAM: CHEST - 2 VIEW COMPARISON:  12/10/2018 FINDINGS: There are postoperative changes in the lower right lung. No new consolidation or edema. No pleural effusion. Stable cardiomediastinal contours with normal heart size. No acute osseous abnormality. IMPRESSION: No acute process in the chest. Electronically Signed   By: Praneil  Patel M.D.   On: 04/28/2020 10:01   CT Chest W Contrast  Result Date: 05/07/2020 CLINICAL DATA:  Follow-up non-small cell lung cancer. EXAM: CT CHEST WITH CONTRAST  TECHNIQUE: Multidetector CT imaging of the chest was performed during intravenous contrast administration. CONTRAST:  75mL OMNIPAQUE IOHEXOL 300 MG/ML  SOLN COMPARISON:  10/21/2019 FINDINGS: Cardiovascular: Normal heart size. No pericardial effusion. Aortic atherosclerosis. Coronary artery calcifications. Mediastinum/Nodes: Normal appearance of the thyroid gland. The trachea appears patent and is midline. Normal appearance of the esophagus. No enlarged axillary, supraclavicular, mediastinal, or hilar lymph nodes. Lungs/Pleura: No pleural effusion. Postoperative changes and volume loss are noted within the right lower lobe. Stable 2 mm subpleural nodule in the lateral right upper lobe, image 79/7. 2 mm right upper lobe lung nodule is also unchanged, image 72/7. No new or enlarging suspicious lung nodules. Upper Abdomen: No acute abnormality. Musculoskeletal: Spondylosis identified within the thoracic spine. No acute or suspicious osseous findings. IMPRESSION: 1. Stable CT of the chest. No specific findings identified to suggest residual or recurrent tumor or metastatic disease. 2. Unchanged tiny, nonspecific lung nodules. 3. Aortic atherosclerosis. Coronary artery calcifications noted. Aortic Atherosclerosis (ICD10-I70.0). Electronically Signed     By: Taylor  Stroud M.D.   On: 05/07/2020 10:11    ASSESSMENT AND PLAN: This is a very pleasant 74 years old African-American female with stage IIb (T3, N0, M0) non-small cell lung cancer, adenocarcinoma with multifocal disease in the right lower lobe status post wedge resection x3 of the right lower lobe with lymph node sampling in May 2020. The patient has no actionable mutations and she has negative PDL 1 expression. She underwent 4 cycles of adjuvant systemic chemotherapy with cisplatin and Alimta.  She tolerated her treatment well with no concerning complaints except for fatigue. The patient is currently on observation and she is feeling fine with no concerning  complaints except for dry cough. She had repeat CT scan of the chest performed recently.  I personally and independently reviewed the scans and discussed the results with the patient today. Her scan showed no concerning findings for disease recurrence or metastasis. I recommended for her to continue on observation with repeat CT scan of the chest in 6 months. The patient was advised to call immediately if she has any other concerning symptoms in the interval. The patient voices understanding of current disease status and treatment options and is in agreement with the current care plan. All questions were answered. The patient knows to call the clinic with any problems, questions or concerns. We can certainly see the patient much sooner if necessary.  Disclaimer: This note was dictated with voice recognition software. Similar sounding words can inadvertently be transcribed and may not be corrected upon review.       

## 2020-07-16 ENCOUNTER — Telehealth: Payer: Self-pay | Admitting: *Deleted

## 2020-07-16 NOTE — Telephone Encounter (Signed)
Received notification from [facility] regarding a prior authorization for Diclofenac. Authorization has been APPROVED from [facility] Date:06/16/2020;Coverage End Date:07/16/2021;

## 2020-07-22 DIAGNOSIS — L309 Dermatitis, unspecified: Secondary | ICD-10-CM | POA: Diagnosis not present

## 2020-07-23 NOTE — Progress Notes (Deleted)
Office Visit Note  Patient: Stephanie Crawford             Date of Birth: 09-23-1945           MRN: [id number]             PCP: [name], MD Referring: [name],* Visit Date: 08/05/2020 Occupation: @GUAROCC @  Subjective:  No chief complaint on file.   History of Present Illness: Stephanie Crawford is a 75 y.o. female ***   Activities of Daily Living:  Patient reports morning stiffness for *** {minute/hour:19697}.   Patient {ACTIONS;DENIES/REPORTS:21021675::"Denies"} nocturnal pain.  Difficulty dressing/grooming: {ACTIONS;DENIES/REPORTS:21021675::"Denies"} Difficulty climbing stairs: {ACTIONS;DENIES/REPORTS:21021675::"Denies"} Difficulty getting out of chair: {ACTIONS;DENIES/REPORTS:21021675::"Denies"} Difficulty using hands for taps, buttons, cutlery, and/or writing: {ACTIONS;DENIES/REPORTS:21021675::"Denies"}  No Rheumatology ROS completed.   PMFS History:  Patient Active Problem List   Diagnosis Date Noted  . Hypertension 02/24/2019  . Goals of care, counseling/discussion 11/14/2018  . Adenocarcinoma of right lung, stage 2 ([place]) 10/25/2018  . Encounter for antineoplastic chemotherapy 10/25/2018  . S/P partial lobectomy of lung 10/09/2018  . Primary osteoarthritis of both knees 05/17/2017  . Primary osteoarthritis of both hands 05/17/2017  . DDD (degenerative disc disease), lumbar 05/17/2017  . History of gastroesophageal reflux (GERD) 05/17/2017  . History of shingles 05/17/2017  . History of cellulitis 05/17/2017  . Multiple lung nodules on CT 11/30/2016  . Cough 11/30/2016  . Right knee pain 04/28/2015  . Foot ulcer ([place]) 07/15/2014  . Osteoporosis 01/30/2013  . Vitamin D deficiency 01/13/2013  . Other and unspecified hyperlipidemia 01/06/2013  . Breast thickening 01/06/2013  . Skin lesion of right leg 09/19/2012  . Chronic seasonal allergic rhinitis 09/19/2012  . Elevated bilirubin 07/12/2012  . History of cardiac murmur  07/12/2012  . Rash and nonspecific skin eruption 07/11/2012  . Rectal bleeding 05/11/2011  . Abnormal EKG 03/01/2011  . General medical examination 03/01/2011  . GERD 12/08/2009  . ANKLE EDEMA 12/08/2009    Past Medical History:  Diagnosis Date  . Allergic rhinitis   . Allergy   . Anemia    prior to hysterectomy--1997  . Arthritis   . GERD (gastroesophageal reflux disease)   . Heart murmur   . Hemorrhoid 2011  . History of shingles 04/2014  . Kidney stones   . Lung cancer ([place])   . Nodule of lower lobe of right lung   . Osteoporosis   . Varicose veins of left lower extremity     Family History  Problem Relation Age of Onset  . Prostate cancer Father   . Lung cancer Maternal Grandmother   . Asthma Daughter   . Asthma Sister   . Heart attack Brother   . Colon cancer Neg Hx   . Esophageal cancer Neg Hx   . Pancreatic cancer Neg Hx   . Stomach cancer Neg Hx   . Liver disease Neg Hx   . Rectal cancer Neg Hx    Past Surgical History:  Procedure Laterality Date  . ABDOMINAL HYSTERECTOMY  1997  . APPENDECTOMY  1960  . HEMORRHOID SURGERY  9/11   8/27 had a follow up procedure.    . IR THORACENTESIS ASP PLEURAL SPACE W/IMG GUIDE  10/24/2018  . LIPOMA EXCISION  1975   neck, left breast and righ jaw.  Marland Kitchen VIDEO ASSISTED THORACOSCOPY (VATS)/WEDGE RESECTION Right 10/09/2018   Procedure: VIDEO ASSISTED THORACOSCOPY (VATS)/WEDGE RESECTION;  Surgeon: Melrose Nakayama, MD;  Location: [facility];  Service: Thoracic;  Laterality: Right;  . WRIST  FRACTURE SURGERY     Social History   Social History Narrative   [facility] (11/30/16):   Originally from [place], [place]. Grew up primarily in [place], [place]. She has also lived in [place]. She moved to [facility] in 2006. 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 Recently has acquired an outside dog. No bird exposure. Possible  mold problem in her current home with history of water damage. No hot tub exposure. Enjoys babysitting her grandsons. Remote travel to [place], [place], [place], [place], [place], [place], [place], [place], [place], & [place].    Immunization History  Administered Date(s) Administered  . PFIZER(Purple Top)SARS-COV-2 Vaccination 07/17/2019, 08/13/2019, 04/03/2020  . Td 06/22/2009  . Tdap 01/06/2013     Objective: Vital Signs: LMP 05/23/1995    Physical Exam   Musculoskeletal Exam: ***  CDAI Exam: CDAI Score: -- Patient Global: --; Provider Global: -- Swollen: --; Tender: -- Joint Exam 08/05/2020   No joint exam has been documented for this visit   There is currently no information documented on the homunculus. Go to the Rheumatology activity and complete the homunculus joint exam.  Investigation: No additional findings.  Imaging: No results found.  Recent Labs: Lab Results  Component Value Date   WBC 6.5 05/06/2020   HGB 12.9 05/06/2020   PLT 337 05/06/2020   NA 142 05/06/2020   K 3.5 05/06/2020   CL 107 05/06/2020   CO2 25 05/06/2020   GLUCOSE 97 05/06/2020   BUN 13 05/06/2020   CREATININE 1.06 (H) 05/06/2020   BILITOT 1.3 (H) 05/06/2020   ALKPHOS 85 05/06/2020   AST 26 05/06/2020   ALT 23 05/06/2020   PROT 7.3 05/06/2020   ALBUMIN 3.9 05/06/2020   CALCIUM 9.8 05/06/2020   GFRAA 55 (L) 10/21/2019    Speciality Comments: No specialty comments available.  Procedures:  No procedures performed Allergies: Adhesive [tape]   Assessment / Plan:     Visit Diagnoses: No diagnosis found.  Orders: No orders of the defined types were placed in this encounter.  No orders of the defined types were placed in this encounter.   Face-to-face time spent with patient was *** minutes. Greater than 50% of time was spent in counseling and coordination of care.  Follow-Up Instructions: No follow-ups on file.   Earnestine Mealing, CMA  Note - This record has  been created using Editor, commissioning.  Chart creation errors have been sought, but may not always  have been located. Such creation errors do not reflect on  the standard of medical care.

## 2020-08-05 ENCOUNTER — Ambulatory Visit: Payer: BC Managed Care – PPO | Admitting: Rheumatology

## 2020-08-05 DIAGNOSIS — C3491 Malignant neoplasm of unspecified part of right bronchus or lung: Secondary | ICD-10-CM

## 2020-08-05 DIAGNOSIS — M5136 Other intervertebral disc degeneration, lumbar region: Secondary | ICD-10-CM

## 2020-08-05 DIAGNOSIS — Z8679 Personal history of other diseases of the circulatory system: Secondary | ICD-10-CM

## 2020-08-05 DIAGNOSIS — Z8619 Personal history of other infectious and parasitic diseases: Secondary | ICD-10-CM

## 2020-08-05 DIAGNOSIS — M19042 Primary osteoarthritis, left hand: Secondary | ICD-10-CM

## 2020-08-05 DIAGNOSIS — M7061 Trochanteric bursitis, right hip: Secondary | ICD-10-CM

## 2020-08-05 DIAGNOSIS — Z872 Personal history of diseases of the skin and subcutaneous tissue: Secondary | ICD-10-CM

## 2020-08-05 DIAGNOSIS — M170 Bilateral primary osteoarthritis of knee: Secondary | ICD-10-CM

## 2020-08-05 DIAGNOSIS — Z8719 Personal history of other diseases of the digestive system: Secondary | ICD-10-CM

## 2020-08-05 DIAGNOSIS — M810 Age-related osteoporosis without current pathological fracture: Secondary | ICD-10-CM

## 2020-08-05 DIAGNOSIS — Z902 Acquired absence of lung [part of]: Secondary | ICD-10-CM

## 2020-08-05 DIAGNOSIS — Z8639 Personal history of other endocrine, nutritional and metabolic disease: Secondary | ICD-10-CM

## 2020-08-23 NOTE — Progress Notes (Signed)
Office Visit Note  Patient: Stephanie Crawford             Date of Birth: 07-01-1945           MRN: [id number]             PCP: [name], MD Referring: [name],* Visit Date: 09/06/2020 Occupation: @GUAROCC @  Subjective:  Other (Right hip pain and right shoulder blade soreness )   History of Present Illness: Stephanie Crawford is a 75 y.o. female with a history of osteoarthritis and osteoporosis.  She started taking Fosamax about 6 weeks ago and has been tolerating it well.  She states Fosamax makes her sleepy.  She has been having some discomfort over the right trochanteric area.  She continues to have some lower back pain.  She has a stiffness in her hands but denies any swelling.  Activities of Daily Living:  Patient reports morning stiffness for 0 minutes.   Patient Denies nocturnal pain.  Difficulty dressing/grooming: Denies Difficulty climbing stairs: Denies Difficulty getting out of chair: Denies Difficulty using hands for taps, buttons, cutlery, and/or writing: Denies  Review of Systems  Constitutional: Negative for fatigue.  HENT: Negative for mouth sores, mouth dryness and nose dryness.   Eyes: Positive for itching and dryness. Negative for pain.  Respiratory: Negative for shortness of breath and difficulty breathing.   Cardiovascular: Negative for chest pain and palpitations.  Gastrointestinal: Negative for blood in stool, constipation and diarrhea.  Endocrine: Negative for increased urination.  Genitourinary: Negative for difficulty urinating.  Musculoskeletal: Positive for arthralgias and joint pain. Negative for joint swelling, myalgias, morning stiffness, muscle tenderness and myalgias.  Skin: Positive for rash. Negative for color change and redness.  Allergic/Immunologic: Positive for susceptible to infections.  Neurological: Positive for parasthesias. Negative for dizziness, numbness, headaches, memory loss and weakness.   Hematological: Negative for bruising/bleeding tendency.  Psychiatric/Behavioral: Negative for confusion.    PMFS History:  Patient Active Problem List   Diagnosis Date Noted  . Hypertension 02/24/2019  . Goals of care, counseling/discussion 11/14/2018  . Adenocarcinoma of right lung, stage 2 ([facility]) 10/25/2018  . Encounter for antineoplastic chemotherapy 10/25/2018  . S/P partial lobectomy of lung 10/09/2018  . Primary osteoarthritis of both knees 05/17/2017  . Primary osteoarthritis of both hands 05/17/2017  . DDD (degenerative disc disease), lumbar 05/17/2017  . History of gastroesophageal reflux (GERD) 05/17/2017  . History of shingles 05/17/2017  . History of cellulitis 05/17/2017  . Multiple lung nodules on CT 11/30/2016  . Cough 11/30/2016  . Right knee pain 04/28/2015  . Foot ulcer ([facility]) 07/15/2014  . Osteoporosis 01/30/2013  . Vitamin D deficiency 01/13/2013  . Other and unspecified hyperlipidemia 01/06/2013  . Breast thickening 01/06/2013  . Skin lesion of right leg 09/19/2012  . Chronic seasonal allergic rhinitis 09/19/2012  . Elevated bilirubin 07/12/2012  . History of cardiac murmur 07/12/2012  . Rash and nonspecific skin eruption 07/11/2012  . Rectal bleeding 05/11/2011  . Abnormal EKG 03/01/2011  . General medical examination 03/01/2011  . GERD 12/08/2009  . ANKLE EDEMA 12/08/2009    Past Medical History:  Diagnosis Date  . Allergic rhinitis   . Allergy   . Anemia    prior to hysterectomy--1997  . Arthritis   . GERD (gastroesophageal reflux disease)   . Heart murmur   . Hemorrhoid 2011  . History of shingles 04/2014  . Kidney stones   . Lung cancer ([facility])   . Nodule of lower lobe of  right lung   . Osteoporosis   . Varicose veins of left lower extremity     Family History  Problem Relation Age of Onset  . Prostate cancer Father   . Lung cancer Maternal Grandmother   . Asthma Daughter   . Asthma Sister   . Heart attack Brother   . Colon cancer  Neg Hx   . Esophageal cancer Neg Hx   . Pancreatic cancer Neg Hx   . Stomach cancer Neg Hx   . Liver disease Neg Hx   . Rectal cancer Neg Hx    Past Surgical History:  Procedure Laterality Date  . ABDOMINAL HYSTERECTOMY  1997  . APPENDECTOMY  1960  . HEMORRHOID SURGERY  9/11   8/27 had a follow up procedure.    . IR THORACENTESIS ASP PLEURAL SPACE W/IMG GUIDE  10/24/2018  . LIPOMA EXCISION  1975   neck, left breast and righ jaw.  Marland Kitchen VIDEO ASSISTED THORACOSCOPY (VATS)/WEDGE RESECTION Right 10/09/2018   Procedure: VIDEO ASSISTED THORACOSCOPY (VATS)/WEDGE RESECTION;  Surgeon: Melrose Nakayama, MD;  Location: [facility];  Service: Thoracic;  Laterality: Right;  . WRIST FRACTURE SURGERY     Social History   Social History Narrative   [facility] (11/30/16):   Originally from [place], [place]. Grew up primarily in [place], [place]. She has also lived in [place]. She moved to [facility] in 2006. 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 Recently has acquired an outside dog. No bird exposure. Possible mold problem in her current home with history of water damage. No hot tub exposure. Enjoys babysitting her grandsons. Remote travel to [place], [place], [place], [place], [place], [place], [place], [place], [place], & [place].    Immunization History  Administered Date(s) Administered  . PFIZER(Purple Top)SARS-COV-2 Vaccination 07/17/2019, 08/13/2019, 04/03/2020  . Td 06/22/2009  . Tdap 01/06/2013     Objective: Vital Signs: BP 132/85 (BP Location: Left Arm, Patient Position: Sitting, Cuff Size: Normal)   Pulse 82   Resp 16   Ht 5\' 8"  (1.727 m)   Wt 231 lb 9.6 oz (105.1 kg)   LMP 05/23/1995   BMI 35.21 kg/m    Physical Exam Vitals and nursing note reviewed.  Constitutional:      Appearance: She is well-developed.  HENT:     Head: Normocephalic and atraumatic.   Eyes:     Conjunctiva/sclera: Conjunctivae normal.  Cardiovascular:     Rate and Rhythm: Normal rate and regular rhythm.     Heart sounds: Normal heart sounds.  Pulmonary:     Effort: Pulmonary effort is normal.     Breath sounds: Normal breath sounds.  Abdominal:     General: Bowel sounds are normal.     Palpations: Abdomen is soft.  Musculoskeletal:     Cervical back: Normal range of motion.  Lymphadenopathy:     Cervical: No cervical adenopathy.  Skin:    General: Skin is warm and dry.     Capillary Refill: Capillary refill takes less than 2 seconds.  Neurological:     Mental Status: She is alert and oriented to person, place, and time.  Psychiatric:        Behavior: Behavior normal.      Musculoskeletal Exam: C-spine was in good range of motion.  She had discomfort range of motion of lumbar spine.  Shoulder joints, elbow joints, wrist joints, MCPs PIPs and DIPs with  good range of motion.  She has some PIP and DIP thickening with no synovitis.  Hip joints and knee joints with good range of motion.  No warmth swelling or effusion was noted.  There was no tenderness over ankles or MTPs.  She had tenderness on palpation of right trochanteric bursa.  CDAI Exam: CDAI Score: -- Patient Global: --; Provider Global: -- Swollen: --; Tender: -- Joint Exam 09/06/2020   No joint exam has been documented for this visit   There is currently no information documented on the homunculus. Go to the Rheumatology activity and complete the homunculus joint exam.  Investigation: No additional findings.  Imaging: No results found.  Recent Labs: Lab Results  Component Value Date   WBC 6.5 05/06/2020   HGB 12.9 05/06/2020   PLT 337 05/06/2020   NA 142 05/06/2020   K 3.5 05/06/2020   CL 107 05/06/2020   CO2 25 05/06/2020   GLUCOSE 97 05/06/2020   BUN 13 05/06/2020   CREATININE 1.06 (H) 05/06/2020   BILITOT 1.3 (H) 05/06/2020   ALKPHOS 85 05/06/2020   AST 26 05/06/2020   ALT 23  05/06/2020   PROT 7.3 05/06/2020   ALBUMIN 3.9 05/06/2020   CALCIUM 9.8 05/06/2020   GFRAA 55 (L) 10/21/2019    Speciality Comments: No specialty comments available.  Procedures:  No procedures performed Allergies: Adhesive [tape]   Assessment / Plan:     Visit Diagnoses: Age-related osteoporosis without current pathological fracture - previously treated with Boniva and Fosamax 16 years ago.  December 17, 2019 left femoral neck BMD 0.647, T score -2.9. Fosamax 70 mg po q week started 07/2020.  She has been tolerating Fosamax well.  She states she gets drowsy on Fosamax.  Patient states that she will get labs with her oncologist in the near future.  Have advised her to get CBC with differential and CMP with GFR.  We will see her back in 6 months.  Need for resistive exercises were discussed.  History of vitamin D deficiency-she has been taking vitamin D supplement.  Primary osteoarthritis of both hands-joint protection muscle strengthening was discussed.  Primary osteoarthritis of both knees - moderate OA and moderate chondromalacia patella.  Weight loss diet and exercise was discussed.  Lower extremity muscle strength exercises were discussed.  Trochanteric bursitis of both hips-she continues to have discomfort over right trochanteric bursa.  IT band exercises.  DDD (degenerative disc disease), lumbar-lower back pain persists.  Have given her a handout on back exercises.  Adenocarcinoma of right lung, stage 2 ([facility]) - diagnosed with adenocarcinoma stage 2.  She had a partial lobectomy of the right lung and has been undergoing chemotherapy.  S/P partial lobectomy of lung  History of gastroesophageal reflux (GERD)  History of cardiac murmur  History of cellulitis  History of shingles  Orders: No orders of the defined types were placed in this encounter.  No orders of the defined types were placed in this encounter.    Follow-Up Instructions: Return in about 6 months (around  03/08/2021) for Osteoarthritis, Osteoporosis.   Bo Merino, MD  Note - This record has been created using Editor, commissioning.  Chart creation errors have been sought, but may not always  have been located. Such creation errors do not reflect on  the standard of medical care.

## 2020-09-06 ENCOUNTER — Encounter: Payer: Self-pay | Admitting: Rheumatology

## 2020-09-06 ENCOUNTER — Ambulatory Visit (INDEPENDENT_AMBULATORY_CARE_PROVIDER_SITE_OTHER): Payer: BC Managed Care – PPO | Admitting: Rheumatology

## 2020-09-06 ENCOUNTER — Other Ambulatory Visit: Payer: Self-pay

## 2020-09-06 VITALS — BP 132/85 | HR 82 | Resp 16 | Ht 68.0 in | Wt 231.6 lb

## 2020-09-06 DIAGNOSIS — M19042 Primary osteoarthritis, left hand: Secondary | ICD-10-CM

## 2020-09-06 DIAGNOSIS — Z8619 Personal history of other infectious and parasitic diseases: Secondary | ICD-10-CM

## 2020-09-06 DIAGNOSIS — M5136 Other intervertebral disc degeneration, lumbar region: Secondary | ICD-10-CM

## 2020-09-06 DIAGNOSIS — M170 Bilateral primary osteoarthritis of knee: Secondary | ICD-10-CM | POA: Diagnosis not present

## 2020-09-06 DIAGNOSIS — M19041 Primary osteoarthritis, right hand: Secondary | ICD-10-CM

## 2020-09-06 DIAGNOSIS — M7062 Trochanteric bursitis, left hip: Secondary | ICD-10-CM

## 2020-09-06 DIAGNOSIS — Z8719 Personal history of other diseases of the digestive system: Secondary | ICD-10-CM

## 2020-09-06 DIAGNOSIS — M7061 Trochanteric bursitis, right hip: Secondary | ICD-10-CM

## 2020-09-06 DIAGNOSIS — Z8679 Personal history of other diseases of the circulatory system: Secondary | ICD-10-CM

## 2020-09-06 DIAGNOSIS — M810 Age-related osteoporosis without current pathological fracture: Secondary | ICD-10-CM | POA: Diagnosis not present

## 2020-09-06 DIAGNOSIS — Z902 Acquired absence of lung [part of]: Secondary | ICD-10-CM

## 2020-09-06 DIAGNOSIS — Z872 Personal history of diseases of the skin and subcutaneous tissue: Secondary | ICD-10-CM

## 2020-09-06 DIAGNOSIS — Z8639 Personal history of other endocrine, nutritional and metabolic disease: Secondary | ICD-10-CM | POA: Diagnosis not present

## 2020-09-06 DIAGNOSIS — C3491 Malignant neoplasm of unspecified part of right bronchus or lung: Secondary | ICD-10-CM

## 2020-09-06 NOTE — Patient Instructions (Addendum)
Please get CBC with diff and CMP with GFR with next labs   Iliotibial Band Syndrome Rehab Ask your health care provider which exercises are safe for you. Do exercises exactly as told by your health care provider and adjust them as directed. It is normal to feel mild stretching, pulling, tightness, or discomfort as you do these exercises. Stop right away if you feel sudden pain or your pain gets significantly worse. Do not begin these exercises until told by your health care provider. Stretching and range-of-motion exercises These exercises warm up your muscles and joints and improve the movement and flexibility of your hip and pelvis. Quadriceps stretch, prone 1. Lie on your abdomen (prone position) on a firm surface, such as a bed or padded floor. 2. Bend your left / right knee and reach back to hold your ankle or pant leg. If you cannot reach your ankle or pant leg, loop a belt around your foot and grab the belt instead. 3. Gently pull your heel toward your buttocks. Your knee should not slide out to the side. You should feel a stretch in the front of your thigh and knee (quadriceps). 4. Hold this position for __________ seconds. Repeat __________ times. Complete this exercise __________ times a day.   Iliotibial band stretch An iliotibial band is a strong band of muscle tissue that runs from the outer side of your hip to the outer side of your thigh and knee. 1. Lie on your side with your left / right leg in the top position. 2. Bend both of your knees and grab your left / right ankle. Stretch out your bottom arm to help you balance. 3. Slowly bring your top knee back so your thigh goes behind your trunk. 4. Slowly lower your top leg toward the floor until you feel a gentle stretch on the outside of your left / right hip and thigh. If you do not feel a stretch and your knee will not fall farther, place the heel of your other foot on top of your knee and pull your knee down toward the floor with  your foot. 5. Hold this position for __________ seconds. Repeat __________ times. Complete this exercise __________ times a day.   Strengthening exercises These exercises build strength and endurance in your hip and pelvis. Endurance is the ability to use your muscles for a long time, even after they get tired. Straight leg raises, side-lying This exercise strengthens the muscles that rotate the leg at the hip and move it away from your body (hip abductors). 1. Lie on your side with your left / right leg in the top position. Lie so your head, shoulder, hip, and knee line up. You may bend your bottom knee to help you balance. 2. Roll your hips slightly forward so your hips are stacked directly over each other and your left / right knee is facing forward. 3. Tense the muscles in your outer thigh and lift your top leg 4-6 inches (10-15 cm). 4. Hold this position for __________ seconds. 5. Slowly lower your leg to return to the starting position. Let your muscles relax completely before doing another repetition. Repeat __________ times. Complete this exercise __________ times a day.   Leg raises, prone This exercise strengthens the muscles that move the hips backward (hip extensors). 1. Lie on your abdomen (prone position) on your bed or a firm surface. You can put a pillow under your hips if that is more comfortable for your lower back. 2. [place]  your left / right knee so your foot is straight up in the air. 3. Squeeze your buttocks muscles and lift your left / right thigh off the bed. Do not let your back arch. 4. Tense your thigh muscle as hard as you can without increasing any knee pain. 5. Hold this position for __________ seconds. 6. Slowly lower your leg to return to the starting position and allow it to relax completely. Repeat __________ times. Complete this exercise __________ times a day. Hip hike 1. Stand sideways on a bottom step. Stand on your left / right leg with your other foot  unsupported next to the step. You can hold on to a railing or wall for balance if needed. 2. Keep your knees straight and your torso square. Then lift your left / right hip up toward the ceiling. 3. Slowly let your left / right hip lower toward the floor, past the starting position. Your foot should get closer to the floor. Do not lean or bend your knees. Repeat __________ times. Complete this exercise __________ times a day. This information is not intended to replace advice given to you by your health care provider. Make sure you discuss any questions you have with your health care provider. Document Revised: 07/16/2019 Document Reviewed: 07/16/2019 Elsevier Patient Education  2021 [place].  Back Exercises The following exercises strengthen the muscles that help to support the trunk and back. They also help to keep the lower back flexible. Doing these exercises can help to prevent back pain or lessen existing pain.  If you have back pain or discomfort, try doing these exercises 2-3 times each day or as told by your health care provider.  As your pain improves, do them once each day, but increase the number of times that you repeat the steps for each exercise (do more repetitions).  To prevent the recurrence of back pain, continue to do these exercises once each day or as told by your health care provider. Do exercises exactly as told by your health care provider and adjust them as directed. It is normal to feel mild stretching, pulling, tightness, or discomfort as you do these exercises, but you should stop right away if you feel sudden pain or your pain gets worse. Exercises Single knee to chest Repeat these steps 3-5 times for each leg: 6. Lie on your back on a firm bed or the floor with your legs extended. 7. Bring one knee to your chest. Your other leg should stay extended and in contact with the floor. 8. Hold your knee in place by grabbing your knee or thigh with both hands and  hold. 9. Pull on your knee until you feel a gentle stretch in your lower back or buttocks. 10. Hold the stretch for 10-30 seconds. 11. Slowly release and straighten your leg. Pelvic tilt Repeat these steps 5-10 times: 6. Lie on your back on a firm bed or the floor with your legs extended. 7. Bend your knees so they are pointing toward the ceiling and your feet are flat on the floor. 8. Tighten your lower abdominal muscles to press your lower back against the floor. This motion will tilt your pelvis so your tailbone points up toward the ceiling instead of pointing to your feet or the floor. 9. With gentle tension and even breathing, hold this position for 5-10 seconds. Cat-cow Repeat these steps until your lower back becomes more flexible: 7. Get into a hands-and-knees position on a firm surface. Keep your hands  under your shoulders, and keep your knees under your hips. You may place padding under your knees for comfort. 8. Let your head hang down toward your chest. Contract your abdominal muscles and point your tailbone toward the floor so your lower back becomes rounded like the back of a cat. 9. Hold this position for 5 seconds. 10. Slowly lift your head, let your abdominal muscles relax and point your tailbone up toward the ceiling so your back forms a sagging arch like the back of a cow. 11. Hold this position for 5 seconds.   Press-ups Repeat these steps 5-10 times: 4. Lie on your abdomen (face-down) on the floor. 5. Place your palms near your head, about shoulder-width apart. 6. Keeping your back as relaxed as possible and keeping your hips on the floor, slowly straighten your arms to raise the top half of your body and lift your shoulders. Do not use your back muscles to raise your upper torso. You may adjust the placement of your hands to make yourself more comfortable. 7. Hold this position for 5 seconds while you keep your back relaxed. 8. Slowly return to lying flat on the floor.    Bridges Repeat these steps 10 times: 1. Lie on your back on a firm surface. 2. Bend your knees so they are pointing toward the ceiling and your feet are flat on the floor. Your arms should be flat at your sides, next to your body. 3. Tighten your buttocks muscles and lift your buttocks off the floor until your waist is at almost the same height as your knees. You should feel the muscles working in your buttocks and the back of your thighs. If you do not feel these muscles, slide your feet 1-2 inches farther away from your buttocks. 4. Hold this position for 3-5 seconds. 5. Slowly lower your hips to the starting position, and allow your buttocks muscles to relax completely. If this exercise is too easy, try doing it with your arms crossed over your chest.   Abdominal crunches Repeat these steps 5-10 times: 1. Lie on your back on a firm bed or the floor with your legs extended. 2. Bend your knees so they are pointing toward the ceiling and your feet are flat on the floor. 3. Cross your arms over your chest. 4. Tip your chin slightly toward your chest without bending your neck. 5. Tighten your abdominal muscles and slowly raise your trunk (torso) high enough to lift your shoulder blades a tiny bit off the floor. Avoid raising your torso higher than that because it can put too much stress on your low back and does not help to strengthen your abdominal muscles. 6. Slowly return to your starting position. Back lifts Repeat these steps 5-10 times: 1. Lie on your abdomen (face-down) with your arms at your sides, and rest your forehead on the floor. 2. Tighten the muscles in your legs and your buttocks. 3. Slowly lift your chest off the floor while you keep your hips pressed to the floor. Keep the back of your head in line with the curve in your back. Your eyes should be looking at the floor. 4. Hold this position for 3-5 seconds. 5. Slowly return to your starting position. Contact a health care  provider if:  Your back pain or discomfort gets much worse when you do an exercise.  Your worsening back pain or discomfort does not lessen within 2 hours after you exercise. If you have any of these problems, stop  doing these exercises right away. Do not do them again unless your health care provider says that you can. Get help right away if:  You develop sudden, severe back pain. If this happens, stop doing the exercises right away. Do not do them again unless your health care provider says that you can. This information is not intended to replace advice given to you by your health care provider. Make sure you discuss any questions you have with your health care provider. Document Revised: 09/12/2018 Document Reviewed: 02/07/2018 Elsevier Patient Education  [place].

## 2020-09-16 DIAGNOSIS — L308 Other specified dermatitis: Secondary | ICD-10-CM | POA: Diagnosis not present

## 2020-09-16 DIAGNOSIS — D485 Neoplasm of uncertain behavior of skin: Secondary | ICD-10-CM | POA: Diagnosis not present

## 2020-10-05 DIAGNOSIS — Z0000 Encounter for general adult medical examination without abnormal findings: Secondary | ICD-10-CM | POA: Diagnosis not present

## 2020-10-05 DIAGNOSIS — E559 Vitamin D deficiency, unspecified: Secondary | ICD-10-CM | POA: Diagnosis not present

## 2020-10-05 DIAGNOSIS — N200 Calculus of kidney: Secondary | ICD-10-CM | POA: Diagnosis not present

## 2020-10-05 DIAGNOSIS — E7800 Pure hypercholesterolemia, unspecified: Secondary | ICD-10-CM | POA: Diagnosis not present

## 2020-11-04 ENCOUNTER — Other Ambulatory Visit: Payer: Self-pay | Admitting: Internal Medicine

## 2020-11-04 DIAGNOSIS — Z1231 Encounter for screening mammogram for malignant neoplasm of breast: Secondary | ICD-10-CM

## 2020-11-08 ENCOUNTER — Ambulatory Visit (HOSPITAL_COMMUNITY)
Admission: RE | Admit: 2020-11-08 | Discharge: 2020-11-08 | Disposition: A | Discharge status: Home / Self Care | Payer: BC Managed Care – PPO | Source: Ambulatory Visit | Attending: Internal Medicine | Admitting: Internal Medicine

## 2020-11-08 ENCOUNTER — Inpatient Hospital Stay: Payer: BC Managed Care – PPO | Attending: Internal Medicine

## 2020-11-08 ENCOUNTER — Other Ambulatory Visit: Payer: Self-pay

## 2020-11-08 DIAGNOSIS — J984 Other disorders of lung: Secondary | ICD-10-CM | POA: Diagnosis not present

## 2020-11-08 DIAGNOSIS — R61 Generalized hyperhidrosis: Secondary | ICD-10-CM | POA: Insufficient documentation

## 2020-11-08 DIAGNOSIS — I2510 Atherosclerotic heart disease of native coronary artery without angina pectoris: Secondary | ICD-10-CM | POA: Diagnosis not present

## 2020-11-08 DIAGNOSIS — C3490 Malignant neoplasm of unspecified part of unspecified bronchus or lung: Secondary | ICD-10-CM

## 2020-11-08 DIAGNOSIS — Z79899 Other long term (current) drug therapy: Secondary | ICD-10-CM | POA: Diagnosis not present

## 2020-11-08 DIAGNOSIS — C3431 Malignant neoplasm of lower lobe, right bronchus or lung: Secondary | ICD-10-CM | POA: Diagnosis not present

## 2020-11-08 DIAGNOSIS — Z5111 Encounter for antineoplastic chemotherapy: Secondary | ICD-10-CM | POA: Diagnosis not present

## 2020-11-08 LAB — CBC WITH DIFFERENTIAL (CANCER CENTER ONLY)
Abs Immature Granulocytes: 0.02 10*3/uL (ref 0.00–0.07)
Basophils Absolute: 0.1 10*3/uL (ref 0.0–0.1)
Basophils Relative: 1 %
Eosinophils Absolute: 0.2 10*3/uL (ref 0.0–0.5)
Eosinophils Relative: 4 %
HCT: 39.2 % (ref 36.0–46.0)
Hemoglobin: 13.2 g/dL (ref 12.0–15.0)
Immature Granulocytes: 0 %
Lymphocytes Relative: 46 %
Lymphs Abs: 3.1 10*3/uL (ref 0.7–4.0)
MCH: 30.5 pg (ref 26.0–34.0)
MCHC: 33.7 g/dL (ref 30.0–36.0)
MCV: 90.5 fL (ref 80.0–100.0)
Monocytes Absolute: 0.6 10*3/uL (ref 0.1–1.0)
Monocytes Relative: 9 %
Neutro Abs: 2.6 10*3/uL (ref 1.7–7.7)
Neutrophils Relative %: 40 %
Platelet Count: 259 10*3/uL (ref 150–400)
RBC: 4.33 MIL/uL (ref 3.87–5.11)
RDW: 12.5 % (ref 11.5–15.5)
WBC Count: 6.6 10*3/uL (ref 4.0–10.5)
nRBC: 0 % (ref 0.0–0.2)

## 2020-11-08 LAB — CMP (CANCER CENTER ONLY)
ALT: 20 U/L (ref 0–44)
AST: 22 U/L (ref 15–41)
Albumin: 4.2 g/dL (ref 3.5–5.0)
Alkaline Phosphatase: 58 U/L (ref 38–126)
Anion gap: 5 (ref 5–15)
BUN: 13 mg/dL (ref 8–23)
CO2: 27 mmol/L (ref 22–32)
Calcium: 9.2 mg/dL (ref 8.9–10.3)
Chloride: 106 mmol/L (ref 98–111)
Creatinine: 1.01 mg/dL — ABNORMAL HIGH (ref 0.44–1.00)
GFR, Estimated: 58 mL/min — ABNORMAL LOW (ref >60)
Glucose, Bld: 101 mg/dL — ABNORMAL HIGH (ref 70–99)
Potassium: 3.6 mmol/L (ref 3.5–5.1)
Sodium: 138 mmol/L (ref 135–145)
Total Bilirubin: 1.5 mg/dL — ABNORMAL HIGH (ref 0.3–1.2)
Total Protein: 7.2 g/dL (ref 6.5–8.1)

## 2020-11-08 MED ORDER — IOHEXOL 300 MG/ML  SOLN
75.0000 mL | Freq: Once | INTRAMUSCULAR | Status: AC | PRN
  Administered 2020-11-08: 14:00:00 75 mL via INTRAVENOUS

## 2020-11-08 MED ORDER — SODIUM CHLORIDE (PF) 0.9 % IJ SOLN
INTRAMUSCULAR | Status: AC
  Filled 2020-11-08: qty 50

## 2020-11-10 ENCOUNTER — Other Ambulatory Visit: Payer: Self-pay

## 2020-11-10 ENCOUNTER — Inpatient Hospital Stay (HOSPITAL_BASED_OUTPATIENT_CLINIC_OR_DEPARTMENT_OTHER): Payer: BC Managed Care – PPO | Admitting: Internal Medicine

## 2020-11-10 VITALS — BP 144/80 | HR 96 | Temp 97.9°F | Resp 19 | Ht 68.0 in | Wt 231.4 lb

## 2020-11-10 DIAGNOSIS — Z79899 Other long term (current) drug therapy: Secondary | ICD-10-CM | POA: Diagnosis not present

## 2020-11-10 DIAGNOSIS — R61 Generalized hyperhidrosis: Secondary | ICD-10-CM | POA: Diagnosis not present

## 2020-11-10 DIAGNOSIS — C3491 Malignant neoplasm of unspecified part of right bronchus or lung: Secondary | ICD-10-CM

## 2020-11-10 DIAGNOSIS — C3490 Malignant neoplasm of unspecified part of unspecified bronchus or lung: Secondary | ICD-10-CM | POA: Diagnosis not present

## 2020-11-10 DIAGNOSIS — C3431 Malignant neoplasm of lower lobe, right bronchus or lung: Secondary | ICD-10-CM | POA: Diagnosis not present

## 2020-11-10 NOTE — Progress Notes (Signed)
[place] [phone]   [phone]  OFFICE PROGRESS NOTE  [name], MD [address] [place] [address]  DIAGNOSIS: Stage IIB (T3, N0, M0) non-small cell lung cancer, adenocarcinoma diagnosed in May 2020.  Biomarker Findings Microsatellite status - Cannot Be Determined Tumor Mutational Burden - Cannot Be Determined Genomic Findings For a complete list of the genes assayed, please refer to the Appendix. KRAS G12V TP53 G244V 7 Disease relevant genes with no reportable alterations: ALK, BRAF, EGFR, ERBB2, MET, RET, ROS1   PDL 1 expression: negative   PRIOR THERAPY:  1) Status post wedge resection x3 of the right lower lobe with lymph node sampling under the care of Dr. Roxan Hockey on 10/09/2018. 2) Adjuvant systemic chemotherapy with cisplatin 75 mg/M2 and Alimta 500 mg/M2 every 3 weeks.  First dose November 22, 2018.  Status post 4 cycles.  Last dose was given on 01/28/2019.  CURRENT THERAPY: Observation.  INTERVAL HISTORY: Stephanie Crawford 75 y.o. female returns to the clinic today for follow-up visit.  The patient is feeling fine today with no concerning complaints except for feeling hot most of the times night sweats.  She denied having any chest pain, shortness of breath, cough or hemoptysis.  She denied having any fever or chills.  She has no nausea, vomiting, diarrhea or constipation.  She has no headache or visual changes.  She has no fever or chills.  She had repeat CT scan of the chest performed recently and she is here for evaluation and discussion of her risk her results.  MEDICAL HISTORY: Past Medical History:  Diagnosis Date   Allergic rhinitis    Allergy    Anemia    prior to hysterectomy--1997   Arthritis    GERD (gastroesophageal reflux disease)    Heart murmur    Hemorrhoid 2011   History of shingles 04/2014   Kidney stones    Lung cancer ([place])    Nodule of lower lobe of right lung     Osteoporosis    Varicose veins of left lower extremity     ALLERGIES:  is allergic to adhesive [tape].  MEDICATIONS:  Current Outpatient Medications  Medication Sig Dispense Refill   alendronate (FOSAMAX) 70 MG tablet Take by mouth once a week.     aspirin 325 MG EC tablet Take 650 mg by mouth daily as needed for pain.     atorvastatin (LIPITOR) 10 MG tablet Take 1 tablet by mouth daily.     azelastine (OPTIVAR) 0.05 % ophthalmic solution Place 1 drop into both eyes daily as needed (irritation).     calcium carbonate (OS-CAL) 600 MG TABS tablet Take 1,200 mg by mouth every evening.      Cholecalciferol (VITAMIN D) 50 MCG (2000 UT) tablet Take 2,000 Units by mouth at bedtime.     diclofenac Sodium (VOLTAREN) 1 % GEL APPLY 2-4 GRAMS TO AFFECTED JOINT UP TO 4 TIMES DAILY. 400 g 2   No current facility-administered medications for this visit.    SURGICAL HISTORY:  Past Surgical History:  Procedure Laterality Date   ABDOMINAL HYSTERECTOMY  [place] [facility]  9/11   8/27 had a follow up procedure.     IR THORACENTESIS ASP PLEURAL SPACE W/IMG GUIDE  10/24/2018   LIPOMA EXCISION  1975   neck, left breast and righ jaw.   VIDEO ASSISTED THORACOSCOPY (VATS)/WEDGE RESECTION Right 10/09/2018   Procedure: VIDEO  ASSISTED THORACOSCOPY (VATS)/WEDGE RESECTION;  Surgeon: Melrose Nakayama, MD;  Location: [facility];  Service: Thoracic;  Laterality: Right;   WRIST FRACTURE SURGERY      REVIEW OF SYSTEMS:  A comprehensive review of systems was negative except for: Constitutional: positive for night sweats   PHYSICAL EXAMINATION: General appearance: alert, cooperative, and no distress Head: Normocephalic, without obvious abnormality, atraumatic Neck: no adenopathy, no JVD, supple, symmetrical, trachea midline, and thyroid not enlarged, symmetric, no tenderness/mass/nodules Lymph nodes: Cervical, supraclavicular, and axillary nodes normal. Resp: clear to auscultation  bilaterally Back: symmetric, no curvature. ROM normal. No CVA tenderness. Cardio: regular rate and rhythm, S1, S2 normal, no murmur, click, rub or gallop GI: soft, non-tender; bowel sounds normal; no masses,  no organomegaly Extremities: extremities normal, atraumatic, no cyanosis or edema  ECOG PERFORMANCE STATUS: 1 - Symptomatic but completely ambulatory  Blood pressure (!) 144/80, pulse 96, temperature 97.9 F (36.6 C), temperature source Tympanic, resp. rate 19, height _0  (1.727 m), weight 231 lb 6.4 oz (105 kg), last menstrual period 05/23/1995, SpO2 100 %.  LABORATORY DATA: Lab Results  Component Value Date   WBC 6.6 11/08/2020   HGB 13.2 11/08/2020   HCT 39.2 11/08/2020   MCV 90.5 11/08/2020   PLT 259 11/08/2020      Chemistry      Component Value Date/Time   NA 138 11/08/2020 1236   K 3.6 11/08/2020 1236   CL 106 11/08/2020 1236   CO2 27 11/08/2020 1236   BUN 13 11/08/2020 1236   CREATININE 1.01 (H) 11/08/2020 1236   CREATININE 0.75 07/14/2014 1231      Component Value Date/Time   CALCIUM 9.2 11/08/2020 1236   CALCIUM 10.1 12/28/2009 2231   ALKPHOS 58 11/08/2020 1236   AST 22 11/08/2020 1236   ALT 20 11/08/2020 1236   BILITOT 1.5 (H) 11/08/2020 1236       RADIOGRAPHIC STUDIES: CT Chest W Contrast  Result Date: 11/09/2020 CLINICAL DATA:  Primary Cancer Type: Follow-up non-small cell lung carcinoma. Previous surgery and chemotherapy. Imaging Indication: Routine surveillance Interval therapy since last imaging? No Initial Cancer Diagnosis Date: 10/09/2018; Established by: Biopsy-proven Detailed Pathology: Stage IIB non-small cell lung cancer, adenocarcinoma. Primary Tumor location: Right lower lobe. Surgeries: Right lower lobe wedge resection 10/09/2018. Chemotherapy: Yes; Ongoing? No; Most recent administration: 01/28/2019 Immunotherapy? No Radiation therapy? No EXAM: CT CHEST WITH CONTRAST TECHNIQUE: Multidetector CT imaging of the chest was performed during  intravenous contrast administration. CONTRAST:  53m OMNIPAQUE IOHEXOL 300 MG/ML  SOLN COMPARISON:  05/06/2020 FINDINGS: Cardiovascular: No acute findings. Aortic and coronary atherosclerotic calcification noted. Mediastinum/Nodes: No lymphadenopathy identified within the thorax. A 9 mm low-attenuation nodule is seen in the right thyroid isthmus which is stable. No specific follow-up imaging recommended, however continued attention can be paid on continued surveillance imaging. Lungs/Pleura: No suspicious nodules or masses identified. 3 mm pulmonary nodule in right middle lobe remains stable. No evidence of pulmonary infiltrate or pleural effusion. Stable scarring in right lower lobe. Upper Abdomen:  Unremarkable. Musculoskeletal:  No suspicious bone lesions. IMPRESSION: Stable exam. No evidence of recurrent or metastatic carcinoma within the thorax. Electronically Signed   By: JMarlaine HindM.D.   On: 11/09/2020 10:39     ASSESSMENT AND PLAN: This is a very pleasant 75 years old African-American female with stage IIb (T3, N0, M0) non-small cell lung cancer, adenocarcinoma with multifocal disease in the right lower lobe status post wedge resection x3 of the right lower lobe with lymph  node sampling in May 2020. The patient has no actionable mutations and she has negative PDL 1 expression. She underwent 4 cycles of adjuvant systemic chemotherapy with cisplatin and Alimta.  She tolerated her treatment well with no concerning complaints except for fatigue. The patient is currently on observation and she is feeling fine today with no concerning complaints.  She is retiring in 2 months. She had repeat CT scan of the chest performed recently.  I personally and independently reviewed the scans and discussed the results with the patient today. Her scan showed no concerning findings for disease recurrence or metastasis. I recommended for her to continue on observation with repeat CT scan of the chest in 6  months. The patient was advised to call immediately if she has any other concerning symptoms in the interval. The patient voices understanding of current disease status and treatment options and is in agreement with the current care plan. All questions were answered. The patient knows to call the clinic with any problems, questions or concerns. We can certainly see the patient much sooner if necessary.  Disclaimer: This note was dictated with voice recognition software. Similar sounding words can inadvertently be transcribed and may not be corrected upon review.

## 2020-12-07 DIAGNOSIS — C3491 Malignant neoplasm of unspecified part of right bronchus or lung: Secondary | ICD-10-CM | POA: Diagnosis not present

## 2020-12-07 DIAGNOSIS — M810 Age-related osteoporosis without current pathological fracture: Secondary | ICD-10-CM | POA: Diagnosis not present

## 2020-12-07 DIAGNOSIS — I83893 Varicose veins of bilateral lower extremities with other complications: Secondary | ICD-10-CM | POA: Diagnosis not present

## 2020-12-07 DIAGNOSIS — E785 Hyperlipidemia, unspecified: Secondary | ICD-10-CM | POA: Diagnosis not present

## 2020-12-18 DIAGNOSIS — Z20822 Contact with and (suspected) exposure to covid-19: Secondary | ICD-10-CM | POA: Diagnosis not present

## 2020-12-28 ENCOUNTER — Other Ambulatory Visit: Payer: Self-pay

## 2020-12-28 ENCOUNTER — Ambulatory Visit
Admission: RE | Admit: 2020-12-28 | Discharge: 2020-12-28 | Disposition: A | Discharge status: Home / Self Care | Payer: BC Managed Care – PPO | Source: Ambulatory Visit

## 2020-12-28 DIAGNOSIS — Z1231 Encounter for screening mammogram for malignant neoplasm of breast: Secondary | ICD-10-CM | POA: Diagnosis not present

## 2021-01-03 DIAGNOSIS — J343 Hypertrophy of nasal turbinates: Secondary | ICD-10-CM | POA: Diagnosis not present

## 2021-01-03 DIAGNOSIS — R0982 Postnasal drip: Secondary | ICD-10-CM | POA: Diagnosis not present

## 2021-01-03 DIAGNOSIS — J310 Chronic rhinitis: Secondary | ICD-10-CM | POA: Diagnosis not present

## 2021-01-03 DIAGNOSIS — K219 Gastro-esophageal reflux disease without esophagitis: Secondary | ICD-10-CM | POA: Diagnosis not present

## 2021-01-03 DIAGNOSIS — H6123 Impacted cerumen, bilateral: Secondary | ICD-10-CM | POA: Diagnosis not present

## 2021-01-11 ENCOUNTER — Ambulatory Visit: Payer: Self-pay

## 2021-01-11 ENCOUNTER — Other Ambulatory Visit: Payer: Self-pay | Admitting: Rheumatology

## 2021-01-11 ENCOUNTER — Ambulatory Visit (INDEPENDENT_AMBULATORY_CARE_PROVIDER_SITE_OTHER): Payer: BC Managed Care – PPO | Admitting: Family Medicine

## 2021-01-11 ENCOUNTER — Other Ambulatory Visit: Payer: Self-pay

## 2021-01-11 ENCOUNTER — Ambulatory Visit: Payer: BC Managed Care – PPO | Admitting: Family Medicine

## 2021-01-11 DIAGNOSIS — M25532 Pain in left wrist: Secondary | ICD-10-CM
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
## 2021-01-11 NOTE — Telephone Encounter (Signed)
Next Visit: 03/08/2021  Last Visit: 09/06/2020  Last Fill: 02/23/2020  Dx: Primary osteoarthritis of both knees   Current Dose per office note on 09/06/2020: not discussed  Okay to refill Voltaren Gel?

## 2021-02-22 NOTE — Progress Notes (Signed)
Office Visit Note  Patient: Stephanie Crawford             Date of Birth: 11-03-1945           MRN: [id number]             PCP: [name], MD Referring: [name],* Visit Date: 03/08/2021 Occupation: @GUAROCC @  Subjective:  Pain in multiple joints.   History of Present Illness: Stephanie Crawford is a 75 y.o. female with a history of osteoporosis and osteoarthritis.  She states she has been taking Fosamax on a regular basis without any side effects.  She has been experiencing pain and stiffness in her bilateral hands.  She states her right middle finger has been triggering for the last 5 to 6 months.  She continues to have some discomfort in her knee joints and trochanteric area.  She states recently she has been having some discomfort in the right side of her lower back which keeps her from walking.  She is also experiencing some leg cramps at night.  Activities of Daily Living:  Patient reports morning stiffness for 5-10 minutes.   Patient Reports nocturnal pain.  Difficulty dressing/grooming: Denies Difficulty climbing stairs: Denies Difficulty getting out of chair: Denies Difficulty using hands for taps, buttons, cutlery, and/or writing: Reports  Review of Systems  Constitutional:  Negative for fatigue.  HENT:  Positive for mouth dryness. Negative for mouth sores and nose dryness.   Eyes:  Negative for pain, itching and dryness.  Respiratory:  Negative for shortness of breath and difficulty breathing.   Cardiovascular:  Negative for chest pain and palpitations.  Gastrointestinal:  Negative for blood in stool, constipation and diarrhea.  Endocrine: Negative for increased urination.  Genitourinary:  Negative for difficulty urinating.  Musculoskeletal:  Positive for joint pain, joint pain, myalgias, morning stiffness, muscle tenderness and myalgias. Negative for joint swelling.  Skin:  Negative for color change, rash and redness.   Allergic/Immunologic: Positive for susceptible to infections.  Neurological:  Positive for numbness. Negative for dizziness, headaches, memory loss and weakness.  Hematological:  Negative for bruising/bleeding tendency.  Psychiatric/Behavioral:  Negative for confusion.    PMFS History:  Patient Active Problem List   Diagnosis Date Noted   Hypertension 02/24/2019   Goals of care, counseling/discussion 11/14/2018   Adenocarcinoma of right lung, stage 2 ([place]) 10/25/2018   Encounter for antineoplastic chemotherapy 10/25/2018   S/P partial lobectomy of lung 10/09/2018   Primary osteoarthritis of both knees 05/17/2017   Primary osteoarthritis of both hands 05/17/2017   DDD (degenerative disc disease), lumbar 05/17/2017   History of gastroesophageal reflux (GERD) 05/17/2017   History of shingles 05/17/2017   History of cellulitis 05/17/2017   Multiple lung nodules on CT 11/30/2016   Cough 11/30/2016   Right knee pain 04/28/2015   Foot ulcer ([place]) 07/15/2014   Osteoporosis 01/30/2013   Vitamin D deficiency 01/13/2013   Other and unspecified hyperlipidemia 01/06/2013   Breast thickening 01/06/2013   Skin lesion of right leg 09/19/2012   Chronic seasonal allergic rhinitis 09/19/2012   Elevated bilirubin 07/12/2012   History of cardiac murmur 07/12/2012   Rash and nonspecific skin eruption 07/11/2012   Rectal bleeding 05/11/2011   Abnormal EKG 03/01/2011   General medical examination 03/01/2011   GERD 12/08/2009   ANKLE EDEMA 12/08/2009    Past Medical History:  Diagnosis Date   Allergic rhinitis    Allergy    Anemia    prior to hysterectomy--1997   Arthritis  GERD (gastroesophageal reflux disease)    Heart murmur    Hemorrhoid 2011   History of shingles 04/2014   Kidney stones    Lung cancer (HCC)    Nodule of lower lobe of right lung    Osteoporosis    Varicose veins of left lower extremity     Family History  Problem Relation Age of Onset   Prostate cancer Father     Asthma Sister    Asthma Daughter    Lung cancer Maternal Grandmother    Heart attack Brother    Colon cancer Neg Hx    Esophageal cancer Neg Hx    Pancreatic cancer Neg Hx    Stomach cancer Neg Hx    Liver disease Neg Hx    Rectal cancer Neg Hx    Breast cancer Neg Hx    Past Surgical History:  Procedure Laterality Date   ABDOMINAL HYSTERECTOMY  1997   APPENDECTOMY  1960   BREAST EXCISIONAL BIOPSY Left    decades ago- "something was removed"   HEMORRHOID SURGERY  01/2010   8/27 had a follow up procedure.     IR THORACENTESIS ASP PLEURAL SPACE W/IMG GUIDE  10/24/2018   LIPOMA EXCISION  1975   neck, left breast and righ jaw.   VIDEO ASSISTED THORACOSCOPY (VATS)/WEDGE RESECTION Right 10/09/2018   Procedure: VIDEO ASSISTED THORACOSCOPY (VATS)/WEDGE RESECTION;  Surgeon: Melrose Nakayama, MD;  Location: [facility];  Service: Thoracic;  Laterality: Right;   WRIST FRACTURE SURGERY     Social History   Social History Narrative   [facility] (11/30/16):   Originally from [place], [place]. Grew up primarily in [place], [place]. She has also lived in [place]. She moved to [facility] in 2006. 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 Recently has acquired an outside dog. No bird exposure. Possible mold problem in her current home with history of water damage. No hot tub exposure. Enjoys babysitting her grandsons. Remote travel to [place], [place], [place], [place], [place], [place], [place], [place], [place], & [place].    Immunization History  Administered Date(s) Administered   PFIZER(Purple Top)SARS-COV-2 Vaccination 07/17/2019, 08/13/2019, 04/03/2020   Td 06/22/2009   Tdap 01/06/2013     Objective: Vital Signs: BP 115/78 (BP Location: Right Arm, Patient Position: Sitting, Cuff Size: Large)   Pulse 80   Ht 5\' 8"  (1.727 m)   Wt 230 lb 6.4 oz (104.5 kg)   LMP  05/23/1995   BMI 35.03 kg/m    Physical Exam Vitals and nursing note reviewed.  Constitutional:      Appearance: She is well-developed.  HENT:     Head: Normocephalic and atraumatic.  Eyes:     Conjunctiva/sclera: Conjunctivae normal.  Cardiovascular:     Rate and Rhythm: Normal rate and regular rhythm.     Heart sounds: Normal heart sounds.  Pulmonary:     Effort: Pulmonary effort is normal.     Breath sounds: Normal breath sounds.  Abdominal:     General: Bowel sounds are normal.     Palpations: Abdomen is soft.  Musculoskeletal:     Cervical back: Normal range of motion.  Lymphadenopathy:     Cervical: No cervical adenopathy.  Skin:    General: Skin is warm and dry.     Capillary Refill: Capillary refill takes less than 2 seconds.  Neurological:     Mental Status: She is alert  and oriented to person, place, and time.  Psychiatric:        Behavior: Behavior normal.     Musculoskeletal Exam: C-spine was in good range of motion.  Shoulder joints, elbow joints or wrist joints with good range of motion.  She had bilateral PIP and DIP thickening.  She had right third trigger finger.  Hip joints and knee joints with good range of motion.  She had no tenderness over trochanteric area.  She had tenderness over the right gluteal region.  She had some discomfort range of motion of her knee joints.  No warmth swelling or effusion was noted.  There was no tenderness over ankles or MTPs.  CDAI Exam: CDAI Score: -- Patient Global: --; Provider Global: -- Swollen: --; Tender: -- Joint Exam 03/08/2021   No joint exam has been documented for this visit   There is currently no information documented on the homunculus. Go to the Rheumatology activity and complete the homunculus joint exam.  Investigation: No additional findings.  Imaging: No results found.  Recent Labs: Lab Results  Component Value Date   WBC 6.6 11/08/2020   HGB 13.2 11/08/2020   PLT 259 11/08/2020   NA 138  11/08/2020   K 3.6 11/08/2020   CL 106 11/08/2020   CO2 27 11/08/2020   GLUCOSE 101 (H) 11/08/2020   BUN 13 11/08/2020   CREATININE 1.01 (H) 11/08/2020   BILITOT 1.5 (H) 11/08/2020   ALKPHOS 58 11/08/2020   AST 22 11/08/2020   ALT 20 11/08/2020   PROT 7.2 11/08/2020   ALBUMIN 4.2 11/08/2020   CALCIUM 9.2 11/08/2020   GFRAA 55 (L) 10/21/2019    Speciality Comments: Fosamax 12/21  Procedures:  No procedures performed Allergies: Adhesive [tape]   Assessment / Plan:     Visit Diagnoses: Age-related osteoporosis without current pathological fracture - previously treated with Boniva and Fosamax 16 years ago.  December 17, 2019 left femoral neck BMD 0.647, T score -2.9. Fosamax 70 mg po q week started 07/2020.  Patient states she started to Fosamax in December 2021 and has been tolerating it well.  Calcium rich diet was also emphasized.  Need for regular exercise was discussed.  History of vitamin D deficiency - 20000 units qd - Plan: VITAMIN D 25 Hydroxy (Vit-D Deficiency, Fractures) with her next labs in 6 months.  Medication monitoring encounter - Plan: CBC with Differential/Platelet, COMPLETE METABOLIC PANEL WITH GFR we will check labs prior to her next visit.  Primary osteoarthritis of both hands-she has bilateral PIP and DIP thickening.  She complains of discomfort in her right second and third PIP joints.  No inflammation was noted.  Use of topical Voltaren gel was advised.  Joint protection was discussed.  A handout on hand exercises was given.  Trigger finger, right middle finger-she had right third trigger finger.  Use of Voltaren gel was discussed.  I also advised her to contact us if she does not have any improvement and may consider cortisone injection.  Piriformis syndrome, right-she has been having tenderness over the right piriformis region which could be localized pain or due to degenerative disease of lumbar spine.  I offered physical therapy but patient declined.  I gave her  a handout on back exercises.  Primary osteoarthritis of both knees - moderate OA and moderate chondromalacia patella.  She continues to have discomfort.  Lower extremity muscle strengthening exercises were discussed.  DDD (degenerative disc disease), lumbar-a handout on lumbar spine exercises was given.  Muscle  cramps-she has been experiencing cramps in her lower extremities at night.  Use of magnesium malate 250 mg at bedtime was discussed.  Other medical problems are listed as follows:  Adenocarcinoma of right lung, stage 2 (HCC) - diagnosed with adenocarcinoma stage 2.  She had a partial lobectomy of the right lung and has been undergoing chemotherapy.  S/P partial lobectomy of lung  Dyslipidemia  History of gastroesophageal reflux (GERD)  History of cellulitis  History of shingles  Orders: Orders Placed This Encounter  Procedures   CBC with Differential/Platelet   COMPLETE METABOLIC PANEL WITH GFR   VITAMIN D 25 Hydroxy (Vit-D Deficiency, Fractures)    Meds ordered this encounter  Medications   diclofenac Sodium (VOLTAREN) 1 % GEL    Sig: Apply 2-4 grams to affected joint 4 times daily as needed.    Dispense:  400 g    Refill:  2     Follow-Up Instructions: Return in about 6 months (around 09/06/2021) for Osteoporosis, Osteoarthritis.   Bo Merino, MD  Note - This record has been created using Editor, commissioning.  Chart creation errors have been sought, but may not always  have been located. Such creation errors do not reflect on  the standard of medical care.

## 2021-03-08 ENCOUNTER — Ambulatory Visit: Payer: Medicare (Managed Care) | Admitting: Rheumatology

## 2021-03-08 ENCOUNTER — Encounter: Payer: Self-pay | Admitting: Rheumatology

## 2021-03-08 ENCOUNTER — Other Ambulatory Visit: Payer: Self-pay

## 2021-03-08 VITALS — BP 115/78 | HR 80 | Ht 68.0 in | Wt 230.4 lb

## 2021-03-08 DIAGNOSIS — M170 Bilateral primary osteoarthritis of knee: Secondary | ICD-10-CM

## 2021-03-08 DIAGNOSIS — M5136 Other intervertebral disc degeneration, lumbar region: Secondary | ICD-10-CM | POA: Diagnosis not present

## 2021-03-08 DIAGNOSIS — M65331 Trigger finger, right middle finger: Secondary | ICD-10-CM

## 2021-03-08 DIAGNOSIS — M19042 Primary osteoarthritis, left hand: Secondary | ICD-10-CM

## 2021-03-08 DIAGNOSIS — M810 Age-related osteoporosis without current pathological fracture: Secondary | ICD-10-CM | POA: Diagnosis not present

## 2021-03-08 DIAGNOSIS — E785 Hyperlipidemia, unspecified: Secondary | ICD-10-CM

## 2021-03-08 DIAGNOSIS — M7061 Trochanteric bursitis, right hip: Secondary | ICD-10-CM

## 2021-03-08 DIAGNOSIS — Z902 Acquired absence of lung [part of]: Secondary | ICD-10-CM | POA: Diagnosis not present

## 2021-03-08 DIAGNOSIS — Z8639 Personal history of other endocrine, nutritional and metabolic disease: Secondary | ICD-10-CM | POA: Diagnosis not present

## 2021-03-08 DIAGNOSIS — Z8679 Personal history of other diseases of the circulatory system: Secondary | ICD-10-CM

## 2021-03-08 DIAGNOSIS — C3491 Malignant neoplasm of unspecified part of right bronchus or lung: Secondary | ICD-10-CM

## 2021-03-08 DIAGNOSIS — Z8719 Personal history of other diseases of the digestive system: Secondary | ICD-10-CM

## 2021-03-08 DIAGNOSIS — G5701 Lesion of sciatic nerve, right lower limb: Secondary | ICD-10-CM | POA: Diagnosis not present

## 2021-03-08 DIAGNOSIS — Z872 Personal history of diseases of the skin and subcutaneous tissue: Secondary | ICD-10-CM

## 2021-03-08 DIAGNOSIS — R252 Cramp and spasm: Secondary | ICD-10-CM | POA: Diagnosis not present

## 2021-03-08 DIAGNOSIS — Z5181 Encounter for therapeutic drug level monitoring: Secondary | ICD-10-CM | POA: Diagnosis not present

## 2021-03-08 DIAGNOSIS — Z8619 Personal history of other infectious and parasitic diseases: Secondary | ICD-10-CM

## 2021-03-08 DIAGNOSIS — M19041 Primary osteoarthritis, right hand: Secondary | ICD-10-CM | POA: Diagnosis not present

## 2021-03-08 DIAGNOSIS — M51369 Other intervertebral disc degeneration, lumbar region without mention of lumbar back pain or lower extremity pain: Secondary | ICD-10-CM

## 2021-03-08 MED ORDER — DICLOFENAC SODIUM 1 % EX GEL: CUTANEOUS | 2 refills | Status: DC

## 2021-03-08 NOTE — Patient Instructions (Signed)
Back Exercises The following exercises strengthen the muscles that help to support the trunk (torso) and back. They also help to keep the lower back flexible. Doing these exercises can help to prevent or lessen existing low back pain. If you have back pain or discomfort, try doing these exercises 2-3 times each day or as told by your health care provider. As your pain improves, do them once each day, but increase the number of times that you repeat the steps for each exercise (do more repetitions). To prevent the recurrence of back pain, continue to do these exercises once each day or as told by your health care provider. Do exercises exactly as told by your health care provider and adjust them as directed. It is normal to feel mild stretching, pulling, tightness, or discomfort as you do these exercises, but you should stop right away if you feel sudden pain or your pain gets worse. Exercises Single knee to chest Repeat these steps 3-5 times for each leg: Lie on your back on a firm bed or the floor with your legs extended. Bring one knee to your chest. Your other leg should stay extended and in contact with the floor. Hold your knee in place by grabbing your knee or thigh with both hands and hold. Pull on your knee until you feel a gentle stretch in your lower back or buttocks. Hold the stretch for 10-30 seconds. Slowly release and straighten your leg. Pelvic tilt Repeat these steps 5-10 times: Lie on your back on a firm bed or the floor with your legs extended. Bend your knees so they are pointing toward the ceiling and your feet are flat on the floor. Tighten your lower abdominal muscles to press your lower back against the floor. This motion will tilt your pelvis so your tailbone points up toward the ceiling instead of pointing to your feet or the floor. With gentle tension and even breathing, hold this position for 5-10 seconds. Cat-cow Repeat these steps until your lower back becomes more  flexible: Get into a hands-and-knees position on a firm bed or the floor. Keep your hands under your shoulders, and keep your knees under your hips. You may place padding under your knees for comfort. Let your head hang down toward your chest. Contract your abdominal muscles and point your tailbone toward the floor so your lower back becomes rounded like the back of a cat. Hold this position for 5 seconds. Slowly lift your head, let your abdominal muscles relax, and point your tailbone up toward the ceiling so your back forms a sagging arch like the back of a cow. Hold this position for 5 seconds.  Press-ups Repeat these steps 5-10 times: Lie on your abdomen (face-down) on a firm bed or the floor. Place your palms near your head, about shoulder-width apart. Keeping your back as relaxed as possible and keeping your hips on the floor, slowly straighten your arms to raise the top half of your body and lift your shoulders. Do not use your back muscles to raise your upper torso. You may adjust the placement of your hands to make yourself more comfortable. Hold this position for 5 seconds while you keep your back relaxed. Slowly return to lying flat on the floor.  Bridges Repeat these steps 10 times: Lie on your back on a firm bed or the floor. Bend your knees so they are pointing toward the ceiling and your feet are flat on the floor. Your arms should be flat at your  sides, next to your body. Tighten your buttocks muscles and lift your buttocks off the floor until your waist is at almost the same height as your knees. You should feel the muscles working in your buttocks and the back of your thighs. If you do not feel these muscles, slide your feet 1-2 inches (2.5-5 cm) farther away from your buttocks. Hold this position for 3-5 seconds. Slowly lower your hips to the starting position, and allow your buttocks muscles to relax completely. If this exercise is too easy, try doing it with your arms  crossed over your chest. Abdominal crunches Repeat these steps 5-10 times: Lie on your back on a firm bed or the floor with your legs extended. Bend your knees so they are pointing toward the ceiling and your feet are flat on the floor. Cross your arms over your chest. Tip your chin slightly toward your chest without bending your neck. Tighten your abdominal muscles and slowly raise your torso high enough to lift your shoulder blades a tiny bit off the floor. Avoid raising your torso higher than that because it can put too much stress on your lower back and does not help to strengthen your abdominal muscles. Slowly return to your starting position. Back lifts Repeat these steps 5-10 times: Lie on your abdomen (face-down) with your arms at your sides, and rest your forehead on the floor. Tighten the muscles in your legs and your buttocks. Slowly lift your chest off the floor while you keep your hips pressed to the floor. Keep the back of your head in line with the curve in your back. Your eyes should be looking at the floor. Hold this position for 3-5 seconds. Slowly return to your starting position. Contact a health care provider if: Your back pain or discomfort gets much worse when you do an exercise. Your worsening back pain or discomfort does not lessen within 2 hours after you exercise. If you have any of these problems, stop doing these exercises right away. Do not do them again unless your health care provider says that you can. Get help right away if: You develop sudden, severe back pain. If this happens, stop doing the exercises right away. Do not do them again unless your health care provider says that you can. This information is not intended to replace advice given to you by your health care provider. Make sure you discuss any questions you have with your health care provider. Document Revised: 07/21/2020 Document Reviewed: 07/21/2020 Elsevier Patient Education  [place] Exercises Hand exercises can be helpful for almost anyone. These exercises can strengthen the hands, improve flexibility and movement, and increase blood flow to the hands. These results can make work and daily tasks easier. Hand exercises can be especially helpful for people who have joint pain from arthritis or have nerve damage from overuse (carpal tunnel syndrome). These exercises can also help people who have injured a hand. Exercises Most of these hand exercises are gentle stretching and motion exercises. It is usually safe to do them often throughout the day. Warming up your hands before exercise may help to reduce stiffness. You can do this with gentle massage or by placing your hands in warm water for 10-15 minutes. It is normal to feel some stretching, pulling, tightness, or mild discomfort as you begin new exercises. This will gradually improve. Stop an exercise right away if you feel sudden, severe pain or your pain gets worse. Ask your health care provider which  exercises are best for you. Knuckle bend or "claw" fist  Stand or sit with your arm, hand, and all five fingers pointed straight up. Make sure to keep your wrist straight during the exercise. Gently bend your fingers down toward your palm until the tips of your fingers are touching the top of your palm. Keep your big knuckle straight and just bend the small knuckles in your fingers. Hold this position for __________ seconds. Straighten (extend) your fingers back to the starting position. Repeat this exercise 5-10 times with each hand. Full finger fist  Stand or sit with your arm, hand, and all five fingers pointed straight up. Make sure to keep your wrist straight during the exercise. Gently bend your fingers into your palm until the tips of your fingers are touching the middle of your palm. Hold this position for __________ seconds. Extend your fingers back to the starting position, stretching every joint  fully. Repeat this exercise 5-10 times with each hand. Straight fist Stand or sit with your arm, hand, and all five fingers pointed straight up. Make sure to keep your wrist straight during the exercise. Gently bend your fingers at the big knuckle, where your fingers meet your hand, and the middle knuckle. Keep the knuckle at the tips of your fingers straight and try to touch the bottom of your palm. Hold this position for __________ seconds. Extend your fingers back to the starting position, stretching every joint fully. Repeat this exercise 5-10 times with each hand. Tabletop  Stand or sit with your arm, hand, and all five fingers pointed straight up. Make sure to keep your wrist straight during the exercise. Gently bend your fingers at the big knuckle, where your fingers meet your hand, as far down as you can while keeping the small knuckles in your fingers straight. Think of forming a tabletop with your fingers. Hold this position for __________ seconds. Extend your fingers back to the starting position, stretching every joint fully. Repeat this exercise 5-10 times with each hand. Finger spread  Place your hand flat on a table with your palm facing down. Make sure your wrist stays straight as you do this exercise. Spread your fingers and thumb apart from each other as far as you can until you feel a gentle stretch. Hold this position for __________ seconds. Bring your fingers and thumb tight together again. Hold this position for __________ seconds. Repeat this exercise 5-10 times with each hand. Making circles  Stand or sit with your arm, hand, and all five fingers pointed straight up. Make sure to keep your wrist straight during the exercise. Make a circle by touching the tip of your thumb to the tip of your index finger. Hold for __________ seconds. Then open your hand wide. Repeat this motion with your thumb and each finger on your hand. Repeat this exercise 5-10 times with each  hand. Thumb motion  Sit with your forearm resting on a table and your wrist straight. Your thumb should be facing up toward the ceiling. Keep your fingers relaxed as you move your thumb. Lift your thumb up as high as you can toward the ceiling. Hold for __________ seconds. Bend your thumb across your palm as far as you can, reaching the tip of your thumb for the small finger (pinkie) side of your palm. Hold for __________ seconds. Repeat this exercise 5-10 times with each hand. Grip strengthening  Hold a stress ball or other soft ball in the middle of your hand. Slowly increase  the pressure, squeezing the ball as much as you can without causing pain. Think of bringing the tips of your fingers into the middle of your palm. All of your finger joints should bend when doing this exercise. Hold your squeeze for __________ seconds, then relax. Repeat this exercise 5-10 times with each hand. Contact a health care provider if: Your hand pain or discomfort gets much worse when you do an exercise. Your hand pain or discomfort does not improve within 2 hours after you exercise. If you have any of these problems, stop doing these exercises right away. Do not do them again unless your health care provider says that you can. Get help right away if: You develop sudden, severe hand pain or swelling. If this happens, stop doing these exercises right away. Do not do them again unless your health care provider says that you can. This information is not intended to replace advice given to you by your health care provider. Make sure you discuss any questions you have with your health care provider. Document Revised: 08/26/2020 Document Reviewed: 08/26/2020 Elsevier Patient Education  [place].

## 2021-03-11 DIAGNOSIS — J342 Deviated nasal septum: Secondary | ICD-10-CM | POA: Diagnosis not present

## 2021-03-11 DIAGNOSIS — J343 Hypertrophy of nasal turbinates: Secondary | ICD-10-CM | POA: Diagnosis not present

## 2021-03-11 DIAGNOSIS — J310 Chronic rhinitis: Secondary | ICD-10-CM | POA: Diagnosis not present

## 2021-05-09 ENCOUNTER — Inpatient Hospital Stay: Payer: Medicare (Managed Care) | Attending: Internal Medicine

## 2021-05-09 ENCOUNTER — Ambulatory Visit (HOSPITAL_COMMUNITY)
Admission: RE | Admit: 2021-05-09 | Discharge: 2021-05-09 | Disposition: A | Discharge status: Home / Self Care | Payer: Medicare (Managed Care) | Source: Ambulatory Visit | Attending: Internal Medicine | Admitting: Internal Medicine

## 2021-05-09 ENCOUNTER — Other Ambulatory Visit: Payer: Self-pay

## 2021-05-09 DIAGNOSIS — C3490 Malignant neoplasm of unspecified part of unspecified bronchus or lung: Secondary | ICD-10-CM | POA: Diagnosis not present

## 2021-05-09 DIAGNOSIS — R911 Solitary pulmonary nodule: Secondary | ICD-10-CM | POA: Diagnosis not present

## 2021-05-09 DIAGNOSIS — R918 Other nonspecific abnormal finding of lung field: Secondary | ICD-10-CM | POA: Diagnosis not present

## 2021-05-09 DIAGNOSIS — Z79899 Other long term (current) drug therapy: Secondary | ICD-10-CM | POA: Insufficient documentation

## 2021-05-09 DIAGNOSIS — M8580 Other specified disorders of bone density and structure, unspecified site: Secondary | ICD-10-CM | POA: Insufficient documentation

## 2021-05-09 DIAGNOSIS — J439 Emphysema, unspecified: Secondary | ICD-10-CM | POA: Diagnosis not present

## 2021-05-09 DIAGNOSIS — I700 Atherosclerosis of aorta: Secondary | ICD-10-CM | POA: Diagnosis not present

## 2021-05-09 DIAGNOSIS — C3431 Malignant neoplasm of lower lobe, right bronchus or lung: Secondary | ICD-10-CM | POA: Insufficient documentation

## 2021-05-09 DIAGNOSIS — R5383 Other fatigue: Secondary | ICD-10-CM | POA: Insufficient documentation

## 2021-05-09 LAB — CBC WITH DIFFERENTIAL (CANCER CENTER ONLY)
Abs Immature Granulocytes: 0.02 10*3/uL (ref 0.00–0.07)
Basophils Absolute: 0.1 10*3/uL (ref 0.0–0.1)
Basophils Relative: 2 %
Eosinophils Absolute: 0.4 10*3/uL (ref 0.0–0.5)
Eosinophils Relative: 7 %
HCT: 40.5 % (ref 36.0–46.0)
Hemoglobin: 13.7 g/dL (ref 12.0–15.0)
Immature Granulocytes: 0 %
Lymphocytes Relative: 43 %
Lymphs Abs: 2.7 10*3/uL (ref 0.7–4.0)
MCH: 30.8 pg (ref 26.0–34.0)
MCHC: 33.8 g/dL (ref 30.0–36.0)
MCV: 91 fL (ref 80.0–100.0)
Monocytes Absolute: 0.6 10*3/uL (ref 0.1–1.0)
Monocytes Relative: 9 %
Neutro Abs: 2.5 10*3/uL (ref 1.7–7.7)
Neutrophils Relative %: 39 %
Platelet Count: 270 10*3/uL (ref 150–400)
RBC: 4.45 MIL/uL (ref 3.87–5.11)
RDW: 12.6 % (ref 11.5–15.5)
WBC Count: 6.2 10*3/uL (ref 4.0–10.5)
nRBC: 0 % (ref 0.0–0.2)

## 2021-05-09 LAB — CMP (CANCER CENTER ONLY)
ALT: 27 U/L (ref 0–44)
AST: 28 U/L (ref 15–41)
Albumin: 3.9 g/dL (ref 3.5–5.0)
Alkaline Phosphatase: 60 U/L (ref 38–126)
Anion gap: 10 (ref 5–15)
BUN: 14 mg/dL (ref 8–23)
CO2: 23 mmol/L (ref 22–32)
Calcium: 9.2 mg/dL (ref 8.9–10.3)
Chloride: 108 mmol/L (ref 98–111)
Creatinine: 1.02 mg/dL — ABNORMAL HIGH (ref 0.44–1.00)
GFR, Estimated: 58 mL/min — ABNORMAL LOW (ref >60)
Glucose, Bld: 98 mg/dL (ref 70–99)
Potassium: 3.8 mmol/L (ref 3.5–5.1)
Sodium: 141 mmol/L (ref 135–145)
Total Bilirubin: 2.2 mg/dL — ABNORMAL HIGH (ref 0.3–1.2)
Total Protein: 6.8 g/dL (ref 6.5–8.1)

## 2021-05-09 MED ORDER — IOHEXOL 350 MG/ML SOLN
75.0000 mL | Freq: Once | INTRAVENOUS | Status: AC | PRN
  Administered 2021-05-09: 16:00:00 60 mL via INTRAVENOUS

## 2021-05-12 ENCOUNTER — Other Ambulatory Visit: Payer: Self-pay

## 2021-05-12 ENCOUNTER — Inpatient Hospital Stay (HOSPITAL_BASED_OUTPATIENT_CLINIC_OR_DEPARTMENT_OTHER): Payer: Medicare (Managed Care) | Admitting: Internal Medicine

## 2021-05-12 VITALS — BP 138/90 | HR 86 | Temp 97.6°F | Resp 18 | Ht 68.0 in | Wt 231.4 lb

## 2021-05-12 DIAGNOSIS — C3431 Malignant neoplasm of lower lobe, right bronchus or lung: Secondary | ICD-10-CM

## 2021-05-12 DIAGNOSIS — M8580 Other specified disorders of bone density and structure, unspecified site: Secondary | ICD-10-CM | POA: Diagnosis not present

## 2021-05-12 DIAGNOSIS — R5383 Other fatigue: Secondary | ICD-10-CM | POA: Diagnosis not present

## 2021-05-12 DIAGNOSIS — C3490 Malignant neoplasm of unspecified part of unspecified bronchus or lung: Secondary | ICD-10-CM | POA: Diagnosis not present

## 2021-05-12 DIAGNOSIS — Z79899 Other long term (current) drug therapy: Secondary | ICD-10-CM | POA: Diagnosis not present

## 2021-05-12 NOTE — Progress Notes (Signed)
[facility] [phone]   [phone]  OFFICE PROGRESS NOTE  [name], MD [address] [place] [address]  DIAGNOSIS: Stage IIB (T3, N0, M0) non-small cell lung cancer, adenocarcinoma diagnosed in May 2020.  Biomarker Findings Microsatellite status - Cannot Be Determined Tumor Mutational Burden - Cannot Be Determined Genomic Findings For a complete list of the genes assayed, please refer to the Appendix. KRAS G12V TP53 G244V 7 Disease relevant genes with no reportable alterations: ALK, BRAF, EGFR, ERBB2, MET, RET, ROS1   PDL 1 expression: negative   PRIOR THERAPY:  1) Status post wedge resection x3 of the right lower lobe with lymph node sampling under the care of Dr. Roxan Hockey on 10/09/2018. 2) Adjuvant systemic chemotherapy with cisplatin 75 mg/M2 and Alimta 500 mg/M2 every 3 weeks.  First dose November 22, 2018.  Status post 4 cycles.  Last dose was given on 01/28/2019.  CURRENT THERAPY: Observation.  INTERVAL HISTORY: Stephanie [place] 75 y.o. female returns to the clinic today for 59-monthfollow-up visit.  The patient is feeling fine today with no concerning complaints except for occasional pain on the right side of the chest from the surgical scar.  She denied having any cough, shortness of breath or hemoptysis.  She denied having any fever or chills.  She has no nausea, vomiting, diarrhea or constipation.  She has no headache or visual changes.  She is here today for evaluation with repeat CT scan of the chest for restaging of her disease.  MEDICAL HISTORY: Past Medical History:  Diagnosis Date   Allergic rhinitis    Allergy    Anemia    prior to hysterectomy--1997   Arthritis    GERD (gastroesophageal reflux disease)    Heart murmur    Hemorrhoid 2011   History of shingles 04/2014   Kidney stones    Lung cancer ([facility]    Nodule of lower lobe of right lung    Osteoporosis    Varicose veins of  left lower extremity     ALLERGIES:  is allergic to adhesive [tape].  MEDICATIONS:  Current Outpatient Medications  Medication Sig Dispense Refill   alendronate (FOSAMAX) 70 MG tablet Take by mouth once a week.     aspirin 325 MG EC tablet Take 650 mg by mouth daily as needed for pain.     atorvastatin (LIPITOR) 10 MG tablet Take 1 tablet by mouth daily.     azelastine (OPTIVAR) 0.05 % ophthalmic solution Place 1 drop into both eyes daily as needed (irritation).     calcium carbonate (OS-CAL) 600 MG TABS tablet Take 1,200 mg by mouth every evening.      Cholecalciferol (VITAMIN D) 50 MCG (2000 UT) tablet Take 2,000 Units by mouth at bedtime.     diclofenac Sodium (VOLTAREN) 1 % GEL Apply 2-4 grams to affected joint 4 times daily as needed. 400 g 2   famotidine (PEPCID) 20 MG tablet Take 20 mg by mouth at bedtime.     ipratropium (ATROVENT) 0.06 % nasal spray SMARTSIG:1-2 Spray(s) Both Nares Twice Daily PRN     No current facility-administered medications for this visit.    SURGICAL HISTORY:  Past Surgical History:  Procedure Laterality Date   ABDOMINAL HYSTERECTOMY  1997   APPENDECTOMY  1960   BREAST EXCISIONAL BIOPSY Left    decades ago- "something was removed"   HEMORRHOID SURGERY  01/2010   8/27 had a follow up procedure.  IR THORACENTESIS ASP PLEURAL SPACE W/IMG GUIDE  10/24/2018   LIPOMA EXCISION  1975   neck, left breast and righ jaw.   VIDEO ASSISTED THORACOSCOPY (VATS)/WEDGE RESECTION Right 10/09/2018   Procedure: VIDEO ASSISTED THORACOSCOPY (VATS)/WEDGE RESECTION;  Surgeon: Melrose Nakayama, MD;  Location: [facility];  Service: Thoracic;  Laterality: Right;   WRIST FRACTURE SURGERY      REVIEW OF SYSTEMS:  A comprehensive review of systems was negative except for: Respiratory: positive for pleurisy/chest pain   PHYSICAL EXAMINATION: General appearance: alert, cooperative, and no distress Head: Normocephalic, without obvious abnormality, atraumatic Neck: no  adenopathy, no JVD, supple, symmetrical, trachea midline, and thyroid not enlarged, symmetric, no tenderness/mass/nodules Lymph nodes: Cervical, supraclavicular, and axillary nodes normal. Resp: clear to auscultation bilaterally Back: symmetric, no curvature. ROM normal. No CVA tenderness. Cardio: regular rate and rhythm, S1, S2 normal, no murmur, click, rub or gallop GI: soft, non-tender; bowel sounds normal; no masses,  no organomegaly Extremities: extremities normal, atraumatic, no cyanosis or edema  ECOG PERFORMANCE STATUS: 1 - Symptomatic but completely ambulatory  Blood pressure 138/90, pulse 86, temperature 97.6 F (36.4 C), temperature source Tympanic, resp. rate 18, height '5\' 8"'  (1.727 m), weight 231 lb 7 oz (105 kg), last menstrual period 05/23/1995, SpO2 99 %.  LABORATORY DATA: Lab Results  Component Value Date   WBC 6.2 05/09/2021   HGB 13.7 05/09/2021   HCT 40.5 05/09/2021   MCV 91.0 05/09/2021   PLT 270 05/09/2021      Chemistry      Component Value Date/Time   NA 141 05/09/2021 1439   K 3.8 05/09/2021 1439   CL 108 05/09/2021 1439   CO2 23 05/09/2021 1439   BUN 14 05/09/2021 1439   CREATININE 1.02 (H) 05/09/2021 1439   CREATININE 0.75 07/14/2014 1231      Component Value Date/Time   CALCIUM 9.2 05/09/2021 1439   CALCIUM 10.1 12/28/2009 2231   ALKPHOS 60 05/09/2021 1439   AST 28 05/09/2021 1439   ALT 27 05/09/2021 1439   BILITOT 2.2 (H) 05/09/2021 1439       RADIOGRAPHIC STUDIES: CT Chest W Contrast  Result Date: 05/10/2021 CLINICAL DATA:  Non-small-cell lung cancer, adenocarcinoma diagnosed 5/20. Wedge resection x3. Chemotherapy complete 9/20. EXAM: CT CHEST WITH CONTRAST TECHNIQUE: Multidetector CT imaging of the chest was performed during intravenous contrast administration. CONTRAST:  64m OMNIPAQUE IOHEXOL 350 MG/ML SOLN COMPARISON:  11/08/2020 FINDINGS: Cardiovascular: Bovine arch. Aortic atherosclerosis. Tortuous thoracic aorta. Mild cardiomegaly,  without pericardial effusion. Lad coronary artery calcification. No central pulmonary embolism, on this non-dedicated study. Mediastinum/Nodes: Subcentimeter right thyroid nodule is unchanged. Not clinically significant; no follow-up imaging recommended (ref: J Am Coll Radiol. 2015 Feb;12(2): 143-50) No supraclavicular adenopathy.  No mediastinal or hilar adenopathy. Lungs/Pleura: No pleural fluid. Right lower lobe wedge resections with resultant scarring and volume loss. Scattered right-sided pulmonary nodules are similar. Example posterior right upper lobe 2 mm on 75/5, anterolateral right upper lobe at 3 mm on 77/5, lateral right middle lobe at 3 mm on 106/5, medial right middle lobe at 3 mm on 98/5 and right lower lobe at 2 mm on 120/5. Upper Abdomen: Normal imaged portions of the liver, spleen, stomach, pancreas, adrenal glands, gallbladder, kidneys. Musculoskeletal: Osteopenia. IMPRESSION: 1. Status post right lower lobe wedge resections, without recurrent or metastatic disease. 2. Similar small right-sided pulmonary nodules, favoring a benign etiology. 3. Aortic Atherosclerosis (ICD10-I70.0) and Emphysema (ICD10-J43.9). Coronary artery atherosclerosis. Electronically Signed   By: KAdria DevonD.  On: 05/10/2021 15:00     ASSESSMENT AND PLAN: This is a very pleasant 75  years old African-American female with stage IIb (T3, N0, M0) non-small cell lung cancer, adenocarcinoma with multifocal disease in the right lower lobe status post wedge resection x3 of the right lower lobe with lymph node sampling in May 2020. The patient has no actionable mutations and she has negative PDL 1 expression. She underwent 4 cycles of adjuvant systemic chemotherapy with cisplatin and Alimta.  She tolerated her treatment well with no concerning complaints except for fatigue. The patient is currently on observation and she is feeling fine with no concerning complaints. She had repeat CT scan of the chest performed  recently.  I personally and independently reviewed the scans and discussed the results with the patient today. Her scan showed no concerning findings for disease recurrence or metastasis. I recommended for her to continue on observation with repeat CT scan of the chest in 1 year. For the coronary artery atherosclerosis, the patient will follow with her primary care physician for adjustment of her cholesterol medication and diet. She was advised to call immediately if she has any other concerning symptoms in the interval. The patient voices understanding of current disease status and treatment options and is in agreement with the current care plan. All questions were answered. The patient knows to call the clinic with any problems, questions or concerns. We can certainly see the patient much sooner if necessary.  Disclaimer: This note was dictated with voice recognition software. Similar sounding words can inadvertently be transcribed and may not be corrected upon review.

## 2021-05-18 ENCOUNTER — Ambulatory Visit: Payer: Medicare (Managed Care) | Attending: Internal Medicine

## 2021-05-18 DIAGNOSIS — Z23 Encounter for immunization: Secondary | ICD-10-CM

## 2021-05-18 NOTE — Progress Notes (Signed)
° °  Covid-19 Vaccination Clinic  Name:  Stephanie [place]    MRN: [id number] DOB: May 27, 1945  05/18/2021  Stephanie [place] was observed post Covid-19 immunization for 15 minutes without incident. She was provided with Vaccine Information Sheet and instruction to access the V-Safe system.   Stephanie [place] was instructed to call 911 with any severe reactions post vaccine: Difficulty breathing  Swelling of face and throat  A fast heartbeat  A bad rash all over body  Dizziness and weakness   Immunizations Administered     Name Date Dose VIS Date Route   Pfizer Covid-19 Vaccine Bivalent Booster 05/18/2021 12:17 PM 0.3 mL 01/19/2021 Intramuscular   Manufacturer: [place]   Lot: [id number]   [place]: [phone]

## 2021-05-20 ENCOUNTER — Other Ambulatory Visit (HOSPITAL_BASED_OUTPATIENT_CLINIC_OR_DEPARTMENT_OTHER): Payer: Self-pay

## 2021-05-20 MED ORDER — PFIZER COVID-19 VAC BIVALENT 30 MCG/0.3ML IM SUSP
INTRAMUSCULAR | 0 refills | Status: DC
  Filled 2021-05-20: qty 0.3, 1d supply, fill #0

## 2021-08-24 NOTE — Progress Notes (Signed)
? ?Office Visit Note ? ?Patient: Stephanie [place]             ?Date of Birth: Nov 26, 1945           ?MRN: [id number]             ?PCP: [name], MD ?Referring: [name],* ?Visit Date: 09/06/2021 ?Occupation: @GUAROCC @ ? ?Subjective:  ?Knee and lower back pain ? ?History of Present Illness: Stephanie [place] is a 76 y.o. female with history of osteoporosis, osteoarthritis and degenerative disc disease.  She states she has been taking Fosamax on a regular basis.  She has been also taking calcium and vitamin D and exercising.  She states she continues to have pain and stiffness in her bilateral hands.  She has intermittent bilateral middle trigger finger.  She has been experiencing increased pain and discomfort in her right knee joint and difficulty walking.  She continues to have some lower back pain.  She is requesting a cortisone injection as she is going to [place] next month. ? ?Activities of Daily Living:  ?Patient reports morning stiffness for 1 hour.   ?Patient Reports nocturnal pain.  ?Difficulty dressing/grooming: Denies ?Difficulty climbing stairs: Reports ?Difficulty getting out of chair: Denies ?Difficulty using hands for taps, buttons, cutlery, and/or writing: Denies ? ?Review of Systems  ?Constitutional:  Positive for fatigue.  ?HENT:  Positive for mouth dryness.   ?Eyes:  Negative for dryness.  ?Respiratory:  Negative for shortness of breath.   ?Cardiovascular:  Negative for chest pain and palpitations.  ?Gastrointestinal:  Negative for blood in stool, constipation and diarrhea.  ?Endocrine: Negative for increased urination.  ?Genitourinary:  Negative for nocturia.  ?Musculoskeletal:  Positive for joint pain, joint pain, myalgias, morning stiffness and myalgias.  ?Skin:  Negative for color change, rash and sensitivity to sunlight.  ?Allergic/Immunologic: Negative for susceptible to infections.  ?Neurological:  Negative for dizziness and headaches.  ?Hematological:   Negative for swollen glands.  ?Psychiatric/Behavioral:  Negative for depressed mood and sleep disturbance. The patient is not nervous/anxious.   ? ?PMFS History:  ?Patient Active Problem List  ? Diagnosis Date Noted  ? Hypertension 02/24/2019  ? Goals of care, counseling/discussion 11/14/2018  ? Adenocarcinoma of right lung, stage 2 ([facility]) 10/25/2018  ? Encounter for antineoplastic chemotherapy 10/25/2018  ? S/P partial lobectomy of lung 10/09/2018  ? Primary osteoarthritis of both knees 05/17/2017  ? Primary osteoarthritis of both hands 05/17/2017  ? DDD (degenerative disc disease), lumbar 05/17/2017  ? History of gastroesophageal reflux (GERD) 05/17/2017  ? History of shingles 05/17/2017  ? History of cellulitis 05/17/2017  ? Multiple lung nodules on CT 11/30/2016  ? Cough 11/30/2016  ? Right knee pain 04/28/2015  ? Foot ulcer ([facility]) 07/15/2014  ? Osteoporosis 01/30/2013  ? Vitamin D deficiency 01/13/2013  ? Other and unspecified hyperlipidemia 01/06/2013  ? Breast thickening 01/06/2013  ? Skin lesion of right leg 09/19/2012  ? Chronic seasonal allergic rhinitis 09/19/2012  ? Elevated bilirubin 07/12/2012  ? History of cardiac murmur 07/12/2012  ? Rash and nonspecific skin eruption 07/11/2012  ? Rectal bleeding 05/11/2011  ? Abnormal EKG 03/01/2011  ? General medical examination 03/01/2011  ? GERD 12/08/2009  ? ANKLE EDEMA 12/08/2009  ?  ?Past Medical History:  ?Diagnosis Date  ? Allergic rhinitis   ? Allergy   ? Anemia   ? prior to hysterectomy--1997  ? Arthritis   ? GERD (gastroesophageal reflux disease)   ? Heart murmur   ? Hemorrhoid 2011  ?  History of shingles 04/2014  ? Kidney stones   ? Lung cancer ([facility])   ? Nodule of lower lobe of right lung   ? Osteoporosis   ? Varicose veins of left lower extremity   ?  ?Family History  ?Problem Relation Age of Onset  ? Cancer Father   ? Prostate cancer Father   ? Asthma Sister   ? Heart attack Brother   ? Lung cancer Maternal Grandmother   ? Asthma Daughter   ? Colon  cancer Neg Hx   ? Esophageal cancer Neg Hx   ? Pancreatic cancer Neg Hx   ? Stomach cancer Neg Hx   ? Liver disease Neg Hx   ? Rectal cancer Neg Hx   ? Breast cancer Neg Hx   ? ?Past Surgical History:  ?Procedure Laterality Date  ? ABDOMINAL HYSTERECTOMY  1997  ? APPENDECTOMY  1960  ? BREAST EXCISIONAL BIOPSY Left   ? decades ago- "something was removed"  ? HEMORRHOID SURGERY  01/2010  ? 8/27 had a follow up procedure.    ? IR THORACENTESIS ASP PLEURAL SPACE W/IMG GUIDE  10/24/2018  ? LIPOMA EXCISION  1975  ? neck, left breast and righ jaw.  ? VIDEO ASSISTED THORACOSCOPY (VATS)/WEDGE RESECTION Right 10/09/2018  ? Procedure: VIDEO ASSISTED THORACOSCOPY (VATS)/WEDGE RESECTION;  Surgeon: Melrose Nakayama, MD;  Location: [facility];  Service: Thoracic;  Laterality: Right;  ? WRIST FRACTURE SURGERY    ? ?Social History  ? ?Social History Narrative  ? [facility] (11/30/16):  ? Originally from [place], [place]. Grew up primarily in [place], [place]. She has also lived in [place]. She moved to [facility] in 2006. 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 Recently has acquired an outside dog. No bird exposure. Possible mold problem in her current home with history of water damage. No hot tub exposure. Enjoys babysitting her grandsons. Remote travel to [place], [place], [place], [place], [place], [place], [place], [place], [place], & [place].   ? ?Immunization History  ?Administered Date(s) Administered  ? PFIZER(Purple Top)SARS-COV-2 Vaccination 07/17/2019, 08/13/2019, 04/03/2020  ? Pension scheme manager 41yrs & up 05/18/2021  ? Td 06/22/2009  ? Tdap 01/06/2013  ?  ? ?Objective: ?Vital Signs: BP 110/73 (BP Location: Left Arm, Patient Position: Sitting, Cuff Size: Large)   Pulse 89   Resp 13   Ht 5\' 8"  (1.727 m)   Wt 231 lb 12.8 oz (105.1 kg)   LMP 05/23/1995   BMI 35.25 kg/m?    ? ?Physical Exam ?Vitals and nursing note reviewed.  ?Constitutional:   ?   Appearance: She is well-developed.  ?HENT:  ?   Head: Normocephalic and atraumatic.  ?Eyes:  ?   Conjunctiva/sclera: Conjunctivae normal.  ?Cardiovascular:  ?   Rate and Rhythm: Normal rate and regular rhythm.  ?   Heart sounds: Normal heart sounds.  ?Pulmonary:  ?   Effort: Pulmonary effort is normal.  ?   Breath sounds: Normal breath sounds.  ?Abdominal:  ?   General: Bowel sounds are normal.  ?   Palpations: Abdomen is soft.  ?Musculoskeletal:  ?   Cervical back: Normal range of motion.  ?Lymphadenopathy:  ?   Cervical: No cervical adenopathy.  ?Skin: ?   General: Skin is warm and dry.  ?   Capillary Refill: Capillary refill takes less than 2 seconds.  ?Neurological:  ?   Mental  Status: She is alert and oriented to person, place, and time.  ?Psychiatric:     ?   Behavior: Behavior normal.  ?  ? ?Musculoskeletal Exam: C-spine was in good range of motion.  Shoulder joints, elbow joints, wrist joints with good range of motion.  She had bilateral PIP and DIP thickening consistent with osteoarthritis.  Hip joints were in good range of motion.  She had tenderness over right piriformis.  There was no tenderness over trochanteric region.  Hip joints and knee joints with good range of motion.  She had discomfort range of motion of the right knee joint without any warmth swelling or effusion.  There was no tenderness over ankles or MTPs. ? ?CDAI Exam: ?CDAI Score: -- ?Patient Global: --; Provider Global: -- ?Swollen: --; Tender: -- ?Joint Exam 09/06/2021  ? ?No joint exam has been documented for this visit  ? ?There is currently no information documented on the homunculus. Go to the Rheumatology activity and complete the homunculus joint exam. ? ?Investigation: ?No additional findings. ? ?Imaging: ?No results found. ? ?Recent Labs: ?Lab Results  ?Component Value Date  ? WBC 6.2 05/09/2021  ? HGB 13.7 05/09/2021  ? PLT 270 05/09/2021  ? NA 141  05/09/2021  ? K 3.8 05/09/2021  ? CL 108 05/09/2021  ? CO2 23 05/09/2021  ? GLUCOSE 98 05/09/2021  ? BUN 14 05/09/2021  ? CREATININE 1.02 (H) 05/09/2021  ? BILITOT 2.2 (H) 05/09/2021  ? ALKPHOS 60 05/09/2021  ? AST 28 12/

## 2021-09-06 ENCOUNTER — Ambulatory Visit (INDEPENDENT_AMBULATORY_CARE_PROVIDER_SITE_OTHER): Payer: Medicare (Managed Care) | Admitting: Rheumatology

## 2021-09-06 ENCOUNTER — Encounter: Payer: Self-pay | Admitting: Rheumatology

## 2021-09-06 VITALS — BP 110/73 | HR 89 | Resp 13 | Ht 68.0 in | Wt 231.8 lb

## 2021-09-06 DIAGNOSIS — M810 Age-related osteoporosis without current pathological fracture: Secondary | ICD-10-CM

## 2021-09-06 DIAGNOSIS — M5136 Other intervertebral disc degeneration, lumbar region: Secondary | ICD-10-CM

## 2021-09-06 DIAGNOSIS — Z8619 Personal history of other infectious and parasitic diseases: Secondary | ICD-10-CM

## 2021-09-06 DIAGNOSIS — M65331 Trigger finger, right middle finger: Secondary | ICD-10-CM | POA: Diagnosis not present

## 2021-09-06 DIAGNOSIS — M19042 Primary osteoarthritis, left hand: Secondary | ICD-10-CM

## 2021-09-06 DIAGNOSIS — M1711 Unilateral primary osteoarthritis, right knee: Secondary | ICD-10-CM

## 2021-09-06 DIAGNOSIS — G5701 Lesion of sciatic nerve, right lower limb: Secondary | ICD-10-CM

## 2021-09-06 DIAGNOSIS — E785 Hyperlipidemia, unspecified: Secondary | ICD-10-CM

## 2021-09-06 DIAGNOSIS — Z872 Personal history of diseases of the skin and subcutaneous tissue: Secondary | ICD-10-CM

## 2021-09-06 DIAGNOSIS — Z8639 Personal history of other endocrine, nutritional and metabolic disease: Secondary | ICD-10-CM | POA: Diagnosis not present

## 2021-09-06 DIAGNOSIS — Z5181 Encounter for therapeutic drug level monitoring: Secondary | ICD-10-CM | POA: Diagnosis not present

## 2021-09-06 DIAGNOSIS — Z902 Acquired absence of lung [part of]: Secondary | ICD-10-CM

## 2021-09-06 DIAGNOSIS — Z8719 Personal history of other diseases of the digestive system: Secondary | ICD-10-CM

## 2021-09-06 DIAGNOSIS — R252 Cramp and spasm: Secondary | ICD-10-CM

## 2021-09-06 DIAGNOSIS — M170 Bilateral primary osteoarthritis of knee: Secondary | ICD-10-CM | POA: Diagnosis not present

## 2021-09-06 DIAGNOSIS — M19041 Primary osteoarthritis, right hand: Secondary | ICD-10-CM

## 2021-09-06 DIAGNOSIS — C3491 Malignant neoplasm of unspecified part of right bronchus or lung: Secondary | ICD-10-CM | POA: Diagnosis not present

## 2021-09-06 MED ORDER — LIDOCAINE HCL 1 % IJ SOLN
1.5000 mL | INTRAMUSCULAR | Status: AC | PRN
  Administered 2021-09-06: 1.5 mL

## 2021-09-06 MED ORDER — TRIAMCINOLONE ACETONIDE 40 MG/ML IJ SUSP
40.0000 mg | INTRAMUSCULAR | Status: AC | PRN
  Administered 2021-09-06: 40 mg via INTRA_ARTICULAR

## 2021-09-06 MED ORDER — TRIAMCINOLONE ACETONIDE 40 MG/ML IJ SUSP
40.0000 mg | INTRAMUSCULAR | Status: AC | PRN
  Administered 2021-09-06: 40 mg via INTRAMUSCULAR

## 2021-09-06 NOTE — Patient Instructions (Signed)
Back Exercises ?The following exercises strengthen the muscles that help to support the trunk (torso) and back. They also help to keep the lower back flexible. Doing these exercises can help to prevent or lessen existing low back pain. ?If you have back pain or discomfort, try doing these exercises 2-3 times each day or as told by your health care provider. ?As your pain improves, do them once each day, but increase the number of times that you repeat the steps for each exercise (do more repetitions). ?To prevent the recurrence of back pain, continue to do these exercises once each day or as told by your health care provider. ?Do exercises exactly as told by your health care provider and adjust them as directed. It is normal to feel mild stretching, pulling, tightness, or discomfort as you do these exercises, but you should stop right away if you feel sudden pain or your pain gets worse. ?Exercises ?Single knee to chest ?Repeat these steps 3-5 times for each leg: ?Lie on your back on a firm bed or the floor with your legs extended. ?Bring one knee to your chest. Your other leg should stay extended and in contact with the floor. ?Hold your knee in place by grabbing your knee or thigh with both hands and hold. ?Pull on your knee until you feel a gentle stretch in your lower back or buttocks. ?Hold the stretch for 10-30 seconds. ?Slowly release and straighten your leg. ? ?Pelvic tilt ?Repeat these steps 5-10 times: ?Lie on your back on a firm bed or the floor with your legs extended. ?Bend your knees so they are pointing toward the ceiling and your feet are flat on the floor. ?Tighten your lower abdominal muscles to press your lower back against the floor. This motion will tilt your pelvis so your tailbone points up toward the ceiling instead of pointing to your feet or the floor. ?With gentle tension and even breathing, hold this position for 5-10 seconds. ? ?Cat-cow ?Repeat these steps until your lower back becomes  more flexible: ?Get into a hands-and-knees position on a firm bed or the floor. Keep your hands under your shoulders, and keep your knees under your hips. You may place padding under your knees for comfort. ?Let your head hang down toward your chest. Contract your abdominal muscles and point your tailbone toward the floor so your lower back becomes rounded like the back of a cat. ?Hold this position for 5 seconds. ?Slowly lift your head, let your abdominal muscles relax, and point your tailbone up toward the ceiling so your back forms a sagging arch like the back of a cow. ?Hold this position for 5 seconds. ? ?Press-ups ?Repeat these steps 5-10 times: ?Lie on your abdomen (face-down) on a firm bed or the floor. ?Place your palms near your head, about shoulder-width apart. ?Keeping your back as relaxed as possible and keeping your hips on the floor, slowly straighten your arms to raise the top half of your body and lift your shoulders. Do not use your back muscles to raise your upper torso. You may adjust the placement of your hands to make yourself more comfortable. ?Hold this position for 5 seconds while you keep your back relaxed. ?Slowly return to lying flat on the floor. ? ?Bridges ?Repeat these steps 10 times: ?Lie on your back on a firm bed or the floor. ?Bend your knees so they are pointing toward the ceiling and your feet are flat on the floor. Your arms should be flat  at your sides, next to your body. ?Tighten your buttocks muscles and lift your buttocks off the floor until your waist is at almost the same height as your knees. You should feel the muscles working in your buttocks and the back of your thighs. If you do not feel these muscles, slide your feet 1-2 inches (2.5-5 cm) farther away from your buttocks. ?Hold this position for 3-5 seconds. ?Slowly lower your hips to the starting position, and allow your buttocks muscles to relax completely. ?If this exercise is too easy, try doing it with your arms  crossed over your chest. ?Abdominal crunches ?Repeat these steps 5-10 times: ?Lie on your back on a firm bed or the floor with your legs extended. ?Bend your knees so they are pointing toward the ceiling and your feet are flat on the floor. ?Cross your arms over your chest. ?Tip your chin slightly toward your chest without bending your neck. ?Tighten your abdominal muscles and slowly raise your torso high enough to lift your shoulder blades a tiny bit off the floor. Avoid raising your torso higher than that because it can put too much stress on your lower back and does not help to strengthen your abdominal muscles. ?Slowly return to your starting position. ? ?Back lifts ?Repeat these steps 5-10 times: ?Lie on your abdomen (face-down) with your arms at your sides, and rest your forehead on the floor. ?Tighten the muscles in your legs and your buttocks. ?Slowly lift your chest off the floor while you keep your hips pressed to the floor. Keep the back of your head in line with the curve in your back. Your eyes should be looking at the floor. ?Hold this position for 3-5 seconds. ?Slowly return to your starting position. ? ?Contact a health care provider if: ?Your back pain or discomfort gets much worse when you do an exercise. ?Your worsening back pain or discomfort does not lessen within 2 hours after you exercise. ?If you have any of these problems, stop doing these exercises right away. Do not do them again unless your health care provider says that you can. ?Get help right away if: ?You develop sudden, severe back pain. If this happens, stop doing the exercises right away. Do not do them again unless your health care provider says that you can. ?This information is not intended to replace advice given to you by your health care provider. Make sure you discuss any questions you have with your health care provider. ?Document Revised: 11/02/2020 Document Reviewed: 07/21/2020 ?Elsevier Patient Education ? [place]. ? ?Knee Exercises ?Ask your health care provider which exercises are safe for you. Do exercises exactly as told by your health care provider and adjust them as directed. It is normal to feel mild stretching, pulling, tightness, or discomfort as you do these exercises. Stop right away if you feel sudden pain or your pain gets worse. Do not begin these exercises until told by your health care provider. ?Stretching and range-of-motion exercises ?These exercises warm up your muscles and joints and improve the movement and flexibility of your knee. These exercises also help to relieve pain and swelling. ?Knee extension, prone ? ?Lie on your abdomen (prone position) on a bed. ?Place your left / right knee just beyond the edge of the surface so your knee is not on the bed. You can put a towel under your left / right thigh just above your kneecap for comfort. ?Relax your leg muscles and allow gravity to straighten your  knee (extension). You should feel a stretch behind your left / right knee. ?Hold this position for __________ seconds. ?Scoot up so your knee is supported between repetitions. ?Repeat __________ times. Complete this exercise __________ times a day. ?Knee flexion, active ? ?Lie on your back with both legs straight. If this causes back discomfort, bend your left / right knee so your foot is flat on the floor. ?Slowly slide your left / right heel back toward your buttocks. Stop when you feel a gentle stretch in the front of your knee or thigh (flexion). ?Hold this position for __________ seconds. ?Slowly slide your left / right heel back to the starting position. ?Repeat __________ times. Complete this exercise __________ times a day. ?Quadriceps stretch, prone ? ?Lie on your abdomen on a firm surface, such as a bed or padded floor. ?Bend your left / right knee and hold your ankle. If you cannot reach your ankle or pant leg, loop a belt around your foot and grab the belt instead. ?Gently pull your heel  toward your buttocks. Your knee should not slide out to the side. You should feel a stretch in the front of your thigh and knee (quadriceps). ?Hold this position for __________ seconds. ?Repeat __________

## 2021-10-13 DIAGNOSIS — N200 Calculus of kidney: Secondary | ICD-10-CM | POA: Diagnosis not present

## 2021-10-13 DIAGNOSIS — K5730 Diverticulosis of large intestine without perforation or abscess without bleeding: Secondary | ICD-10-CM | POA: Diagnosis not present

## 2021-10-13 DIAGNOSIS — Z0000 Encounter for general adult medical examination without abnormal findings: Secondary | ICD-10-CM | POA: Diagnosis not present

## 2021-10-13 DIAGNOSIS — J302 Other seasonal allergic rhinitis: Secondary | ICD-10-CM | POA: Diagnosis not present

## 2021-10-13 DIAGNOSIS — C3491 Malignant neoplasm of unspecified part of right bronchus or lung: Secondary | ICD-10-CM | POA: Diagnosis not present

## 2021-10-13 DIAGNOSIS — Z1331 Encounter for screening for depression: Secondary | ICD-10-CM | POA: Diagnosis not present

## 2021-10-13 DIAGNOSIS — I872 Venous insufficiency (chronic) (peripheral): Secondary | ICD-10-CM | POA: Diagnosis not present

## 2021-10-13 DIAGNOSIS — Z90710 Acquired absence of both cervix and uterus: Secondary | ICD-10-CM | POA: Diagnosis not present

## 2021-10-13 DIAGNOSIS — E559 Vitamin D deficiency, unspecified: Secondary | ICD-10-CM | POA: Diagnosis not present

## 2021-10-13 DIAGNOSIS — I83893 Varicose veins of bilateral lower extremities with other complications: Secondary | ICD-10-CM | POA: Diagnosis not present

## 2021-10-13 DIAGNOSIS — M810 Age-related osteoporosis without current pathological fracture: Secondary | ICD-10-CM | POA: Diagnosis not present

## 2021-10-13 DIAGNOSIS — L409 Psoriasis, unspecified: Secondary | ICD-10-CM | POA: Diagnosis not present

## 2021-10-18 ENCOUNTER — Other Ambulatory Visit: Payer: Self-pay | Admitting: Internal Medicine

## 2021-10-18 DIAGNOSIS — M810 Age-related osteoporosis without current pathological fracture: Secondary | ICD-10-CM

## 2021-11-23 ENCOUNTER — Other Ambulatory Visit: Payer: Self-pay | Admitting: Internal Medicine

## 2021-11-23 DIAGNOSIS — Z1231 Encounter for screening mammogram for malignant neoplasm of breast: Secondary | ICD-10-CM

## 2021-12-29 ENCOUNTER — Ambulatory Visit: Payer: Medicare (Managed Care)

## 2022-01-04 ENCOUNTER — Ambulatory Visit
Admission: RE | Admit: 2022-01-04 | Discharge: 2022-01-04 | Disposition: A | Discharge status: Home / Self Care | Payer: Medicare (Managed Care) | Source: Ambulatory Visit | Attending: Internal Medicine | Admitting: Internal Medicine

## 2022-01-04 DIAGNOSIS — Z1231 Encounter for screening mammogram for malignant neoplasm of breast: Secondary | ICD-10-CM

## 2022-01-18 DIAGNOSIS — H6123 Impacted cerumen, bilateral: Secondary | ICD-10-CM | POA: Diagnosis not present

## 2022-01-18 DIAGNOSIS — J342 Deviated nasal septum: Secondary | ICD-10-CM | POA: Diagnosis not present

## 2022-01-18 DIAGNOSIS — J343 Hypertrophy of nasal turbinates: Secondary | ICD-10-CM | POA: Diagnosis not present

## 2022-01-18 DIAGNOSIS — J310 Chronic rhinitis: Secondary | ICD-10-CM | POA: Diagnosis not present

## 2022-01-18 DIAGNOSIS — H903 Sensorineural hearing loss, bilateral: Secondary | ICD-10-CM | POA: Diagnosis not present

## 2022-01-25 DIAGNOSIS — C3491 Malignant neoplasm of unspecified part of right bronchus or lung: Secondary | ICD-10-CM | POA: Diagnosis not present

## 2022-01-25 DIAGNOSIS — R051 Acute cough: Secondary | ICD-10-CM | POA: Diagnosis not present

## 2022-01-25 DIAGNOSIS — Z03818 Encounter for observation for suspected exposure to other biological agents ruled out: Secondary | ICD-10-CM | POA: Diagnosis not present

## 2022-02-20 NOTE — Progress Notes (Deleted)
Office Visit Note  Patient: Stephanie Crawford             Date of Birth: July 13, 1945           MRN: [id number]             PCP: [name], MD Referring: [name],* Visit Date: 02/22/2022 Occupation: @GUAROCC @  Subjective:  No chief complaint on file.   History of Present Illness: Stephanie Crawford is a 76 y.o. female ***   Activities of Daily Living:  Patient reports morning stiffness for *** {minute/hour:19697}.   Patient {ACTIONS;DENIES/REPORTS:21021675::"Denies"} nocturnal pain.  Difficulty dressing/grooming: {ACTIONS;DENIES/REPORTS:21021675::"Denies"} Difficulty climbing stairs: {ACTIONS;DENIES/REPORTS:21021675::"Denies"} Difficulty getting out of chair: {ACTIONS;DENIES/REPORTS:21021675::"Denies"} Difficulty using hands for taps, buttons, cutlery, and/or writing: {ACTIONS;DENIES/REPORTS:21021675::"Denies"}  No Rheumatology ROS completed.   PMFS History:  Patient Active Problem List   Diagnosis Date Noted  . Hypertension 02/24/2019  . Goals of care, counseling/discussion 11/14/2018  . Adenocarcinoma of right lung, stage 2 ([place]) 10/25/2018  . Encounter for antineoplastic chemotherapy 10/25/2018  . S/P partial lobectomy of lung 10/09/2018  . Primary osteoarthritis of both knees 05/17/2017  . Primary osteoarthritis of both hands 05/17/2017  . DDD (degenerative disc disease), lumbar 05/17/2017  . History of gastroesophageal reflux (GERD) 05/17/2017  . History of shingles 05/17/2017  . History of cellulitis 05/17/2017  . Multiple lung nodules on CT 11/30/2016  . Cough 11/30/2016  . Right knee pain 04/28/2015  . Foot ulcer ([place]) 07/15/2014  . Osteoporosis 01/30/2013  . Vitamin D deficiency 01/13/2013  . Other and unspecified hyperlipidemia 01/06/2013  . Breast thickening 01/06/2013  . Skin lesion of right leg 09/19/2012  . Chronic seasonal allergic rhinitis 09/19/2012  . Elevated bilirubin 07/12/2012  . History of cardiac murmur  07/12/2012  . Rash and nonspecific skin eruption 07/11/2012  . Rectal bleeding 05/11/2011  . Abnormal EKG 03/01/2011  . General medical examination 03/01/2011  . GERD 12/08/2009  . ANKLE EDEMA 12/08/2009    Past Medical History:  Diagnosis Date  . Allergic rhinitis   . Allergy   . Anemia    prior to hysterectomy--1997  . Arthritis   . GERD (gastroesophageal reflux disease)   . Heart murmur   . Hemorrhoid 2011  . History of shingles 04/2014  . Kidney stones   . Lung cancer ([place])   . Nodule of lower lobe of right lung   . Osteoporosis   . Varicose veins of left lower extremity     Family History  Problem Relation Age of Onset  . Cancer Father   . Prostate cancer Father   . Asthma Sister   . Heart attack Brother   . Lung cancer Maternal Grandmother   . Asthma Daughter   . Colon cancer Neg Hx   . Esophageal cancer Neg Hx   . Pancreatic cancer Neg Hx   . Stomach cancer Neg Hx   . Liver disease Neg Hx   . Rectal cancer Neg Hx   . Breast cancer Neg Hx    Past Surgical History:  Procedure Laterality Date  . ABDOMINAL HYSTERECTOMY  1997  . APPENDECTOMY  1960  . BREAST EXCISIONAL BIOPSY Left    decades ago- "something was removed"  . HEMORRHOID SURGERY  01/2010   8/27 had a follow up procedure.    . IR THORACENTESIS ASP PLEURAL SPACE W/IMG GUIDE  10/24/2018  . LIPOMA EXCISION  1975   neck, left breast and righ jaw.  [facility] VIDEO ASSISTED THORACOSCOPY (VATS)/WEDGE RESECTION Right 10/09/2018  Procedure: VIDEO ASSISTED THORACOSCOPY (VATS)/WEDGE RESECTION;  Surgeon: Melrose Nakayama, MD;  Location: [facility];  Service: Thoracic;  Laterality: Right;  . WRIST FRACTURE SURGERY     Social History   Social History Narrative   [facility] (11/30/16):   Originally from [place], [place]. Grew up primarily in [place], [place]. She has also lived in [place]. She moved to [facility] in 2006. 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 Recently has acquired an outside dog. No bird exposure. Possible mold problem in her current home with history of water damage. No hot tub exposure. Enjoys babysitting her grandsons. Remote travel to [place], [place], [place], [place], [place], [place], [place], [place], [place], & [place].    Immunization History  Administered Date(s) Administered  . PFIZER(Purple Top)SARS-COV-2 Vaccination 07/17/2019, 08/13/2019, 04/03/2020  . Pension scheme manager 77yrs & up 05/18/2021  . Td 06/22/2009  . Tdap 01/06/2013     Objective: Vital Signs: LMP 05/23/1995    Physical Exam   Musculoskeletal Exam: ***  CDAI Exam: CDAI Score: -- Patient Global: --; Provider Global: -- Swollen: --; Tender: -- Joint Exam 02/22/2022   No joint exam has been documented for this visit   There is currently no information documented on the homunculus. Go to the Rheumatology activity and complete the homunculus joint exam.  Investigation: No additional findings.  Imaging: No results found.  Recent Labs: Lab Results  Component Value Date   WBC 6.2 05/09/2021   HGB 13.7 05/09/2021   PLT 270 05/09/2021   NA 141 05/09/2021   K 3.8 05/09/2021   CL 108 05/09/2021   CO2 23 05/09/2021   GLUCOSE 98 05/09/2021   BUN 14 05/09/2021   CREATININE 1.02 (H) 05/09/2021   BILITOT 2.2 (H) 05/09/2021   ALKPHOS 60 05/09/2021   AST 28 05/09/2021   ALT 27 05/09/2021   PROT 6.8 05/09/2021   ALBUMIN 3.9 05/09/2021   CALCIUM 9.2 05/09/2021   GFRAA 55 (L) 10/21/2019    Speciality Comments: Fosamax 12/21  Procedures:  No procedures performed Allergies: Adhesive [tape]   Assessment / Plan:     Visit Diagnoses: No diagnosis found.  Orders: No orders of the defined types were placed in this encounter.  No orders of the defined types were placed in this encounter.   Face-to-face time spent with patient was *** minutes.  Greater than 50% of time was spent in counseling and coordination of care.  Follow-Up Instructions: No follow-ups on file.   Earnestine Mealing, CMA  Note - This record has been created using Editor, commissioning.  Chart creation errors have been sought, but may not always  have been located. Such creation errors do not reflect on  the standard of medical care.

## 2022-02-22 ENCOUNTER — Ambulatory Visit: Payer: Medicare (Managed Care) | Admitting: Rheumatology

## 2022-02-22 DIAGNOSIS — Z5181 Encounter for therapeutic drug level monitoring: Secondary | ICD-10-CM

## 2022-02-22 DIAGNOSIS — C3491 Malignant neoplasm of unspecified part of right bronchus or lung: Secondary | ICD-10-CM

## 2022-02-22 DIAGNOSIS — E785 Hyperlipidemia, unspecified: Secondary | ICD-10-CM

## 2022-02-22 DIAGNOSIS — M65331 Trigger finger, right middle finger: Secondary | ICD-10-CM

## 2022-02-22 DIAGNOSIS — M810 Age-related osteoporosis without current pathological fracture: Secondary | ICD-10-CM

## 2022-02-22 DIAGNOSIS — G5701 Lesion of sciatic nerve, right lower limb: Secondary | ICD-10-CM

## 2022-02-22 DIAGNOSIS — Z902 Acquired absence of lung [part of]: Secondary | ICD-10-CM

## 2022-02-22 DIAGNOSIS — Z8639 Personal history of other endocrine, nutritional and metabolic disease: Secondary | ICD-10-CM

## 2022-02-22 DIAGNOSIS — M19041 Primary osteoarthritis, right hand: Secondary | ICD-10-CM

## 2022-02-22 DIAGNOSIS — M5136 Other intervertebral disc degeneration, lumbar region: Secondary | ICD-10-CM

## 2022-02-22 DIAGNOSIS — Z8719 Personal history of other diseases of the digestive system: Secondary | ICD-10-CM

## 2022-02-22 DIAGNOSIS — M170 Bilateral primary osteoarthritis of knee: Secondary | ICD-10-CM

## 2022-02-22 NOTE — Progress Notes (Unsigned)
Office Visit Note  Patient: Stephanie Crawford             Date of Birth: 03-03-1946           MRN: [id number]             PCP: [name], MD Referring: [name],* Visit Date: 02/23/2022 Occupation: @GUAROCC @  Subjective:  Pain in right knee  History of Present Illness: NIKIE CID is a 76 y.o. female with history of osteoporosis, osteoarthritis and degenerative disc disease of the lumbar spine.  She has been taking Fosamax 70 mg p.o. weekly without any side effects.  She takes calcium and vitamin D.  She continues to have some stiffness in her hands especially in her bilateral middle finger.  She has intermittent triggering of her right middle finger.  She has been experiencing increased pain in her right knee joint.  She has discomfort in her bilateral knee joints.  She had good response to cortisone injection in April 2023.  She requests repeat cortisone injection today.  At the right piriformis pain has resolved.  She has intermittent discomfort in her lower back and currently not experiencing any discomfort.   Activities of Daily Living:  Patient reports morning stiffness for 5 minutes.   Patient Reports nocturnal pain.  Difficulty dressing/grooming: Denies Difficulty climbing stairs: Denies Difficulty getting out of chair: Denies Difficulty using hands for taps, buttons, cutlery, and/or writing: Reports  Review of Systems  Constitutional:  Negative for fatigue.  HENT:  Positive for mouth dryness. Negative for mouth sores.   Eyes:  Negative for dryness.  Respiratory:  Negative for shortness of breath.   Cardiovascular:  Negative for palpitations.  Gastrointestinal:  Negative for blood in stool, constipation and diarrhea.  Endocrine: Negative for increased urination.  Genitourinary:  Negative for involuntary urination.  Musculoskeletal:  Positive for joint pain, joint pain, joint swelling and morning stiffness. Negative for gait problem,  myalgias, muscle weakness, muscle tenderness and myalgias.  Skin:  Negative for color change, rash, hair loss and sensitivity to sunlight.  Allergic/Immunologic: Negative for susceptible to infections.  Neurological:  Positive for numbness. Negative for dizziness and headaches.  Hematological:  Negative for swollen glands.  Psychiatric/Behavioral:  Negative for depressed mood and sleep disturbance. The patient is not nervous/anxious.     PMFS History:  Patient Active Problem List   Diagnosis Date Noted   Hypertension 02/24/2019   Goals of care, counseling/discussion 11/14/2018   Adenocarcinoma of right lung, stage 2 ([place]) 10/25/2018   Encounter for antineoplastic chemotherapy 10/25/2018   S/P partial lobectomy of lung 10/09/2018   Primary osteoarthritis of both knees 05/17/2017   Primary osteoarthritis of both hands 05/17/2017   DDD (degenerative disc disease), lumbar 05/17/2017   History of gastroesophageal reflux (GERD) 05/17/2017   History of shingles 05/17/2017   History of cellulitis 05/17/2017   Multiple lung nodules on CT 11/30/2016   Cough 11/30/2016   Right knee pain 04/28/2015   Foot ulcer ([place]) 07/15/2014   Osteoporosis 01/30/2013   Vitamin D deficiency 01/13/2013   Other and unspecified hyperlipidemia 01/06/2013   Breast thickening 01/06/2013   Skin lesion of right leg 09/19/2012   Chronic seasonal allergic rhinitis 09/19/2012   Elevated bilirubin 07/12/2012   History of cardiac murmur 07/12/2012   Rash and nonspecific skin eruption 07/11/2012   Rectal bleeding 05/11/2011   Abnormal EKG 03/01/2011   General medical examination 03/01/2011   GERD 12/08/2009   ANKLE EDEMA 12/08/2009    Past  Medical History:  Diagnosis Date   Allergic rhinitis    Allergy    Anemia    prior to hysterectomy--1997   Arthritis    GERD (gastroesophageal reflux disease)    Heart murmur    Hemorrhoid 2011   History of shingles 04/2014   Kidney stones    Lung cancer (HCC)     Nodule of lower lobe of right lung    Osteoporosis    Varicose veins of left lower extremity     Family History  Problem Relation Age of Onset   Cancer Father    Prostate cancer Father    Asthma Sister    Heart attack Brother    Prostate cancer Brother    Lung cancer Maternal Grandmother    Asthma Daughter    Colon cancer Neg Hx    Esophageal cancer Neg Hx    Pancreatic cancer Neg Hx    Stomach cancer Neg Hx    Liver disease Neg Hx    Rectal cancer Neg Hx    Breast cancer Neg Hx    Past Surgical History:  Procedure Laterality Date   ABDOMINAL HYSTERECTOMY  1997   APPENDECTOMY  1960   BREAST EXCISIONAL BIOPSY Left    decades ago- "something was removed"   HEMORRHOID SURGERY  01/2010   8/27 had a follow up procedure.     IR THORACENTESIS ASP PLEURAL SPACE W/IMG GUIDE  10/24/2018   LIPOMA EXCISION  1975   neck, left breast and righ jaw.   VIDEO ASSISTED THORACOSCOPY (VATS)/WEDGE RESECTION Right 10/09/2018   Procedure: VIDEO ASSISTED THORACOSCOPY (VATS)/WEDGE RESECTION;  Surgeon: Melrose Nakayama, MD;  Location: [facility];  Service: Thoracic;  Laterality: Right;   WRIST FRACTURE SURGERY     Social History   Social History Narrative   [facility] (11/30/16):   Originally from [place], [place]. Grew up primarily in [place], [place]. She has also lived in [place]. She moved to [facility] in 2006. 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 Recently has acquired an outside dog. No bird exposure. Possible mold problem in her current home with history of water damage. No hot tub exposure. Enjoys babysitting her grandsons. Remote travel to [place], [place], [place], [place], [place], [place], [place], [place], [place], & [place].    Immunization History  Administered Date(s) Administered   PFIZER(Purple Top)SARS-COV-2 Vaccination 07/17/2019, 08/13/2019,  04/03/2020   Pfizer Covid-19 Vaccine Bivalent Booster 34yrs & up 05/18/2021   Td 06/22/2009   Tdap 01/06/2013     Objective: Vital Signs: BP 127/84 (BP Location: Left Arm, Patient Position: Sitting, Cuff Size: Normal)   Pulse 73   Resp 17   Ht 5\' 8"  (1.727 m)   Wt 230 lb 6.4 oz (104.5 kg)   LMP 05/23/1995   BMI 35.03 kg/m    Physical Exam Vitals and nursing note reviewed.  Constitutional:      Appearance: She is well-developed.  HENT:     Head: Normocephalic and atraumatic.  Eyes:     Conjunctiva/sclera: Conjunctivae normal.  Cardiovascular:     Rate and Rhythm: Normal rate and regular rhythm.     Heart sounds: Normal heart sounds.  Pulmonary:     Effort: Pulmonary effort is normal.     Breath sounds: Normal breath sounds.  Abdominal:     General: Bowel sounds are normal.     Palpations: Abdomen is soft.  Musculoskeletal:  Cervical back: Normal range of motion.  Lymphadenopathy:     Cervical: No cervical adenopathy.  Skin:    General: Skin is warm and dry.     Capillary Refill: Capillary refill takes less than 2 seconds.  Neurological:     Mental Status: She is alert and oriented to person, place, and time.  Psychiatric:        Behavior: Behavior normal.      Musculoskeletal Exam: Cervical spine was in good range of motion.  She had painful limited range of motion of her lumbar spine.  Shoulder joints, elbow joints, wrist joints with good range of motion.  She had bilateral PIP and DIP thickening.  Hip joints were in good range of motion.  Knee joints with good range of motion with some limitation with extension.  There was no tenderness over ankles or MTPs.  CDAI Exam: CDAI Score: -- Patient Global: --; Provider Global: -- Swollen: --; Tender: -- Joint Exam 02/23/2022   No joint exam has been documented for this visit   There is currently no information documented on the homunculus. Go to the Rheumatology activity and complete the homunculus joint  exam.  Investigation: No additional findings.  Imaging: No results found.  Recent Labs: Lab Results  Component Value Date   WBC 6.2 05/09/2021   HGB 13.7 05/09/2021   PLT 270 05/09/2021   NA 141 05/09/2021   K 3.8 05/09/2021   CL 108 05/09/2021   CO2 23 05/09/2021   GLUCOSE 98 05/09/2021   BUN 14 05/09/2021   CREATININE 1.02 (H) 05/09/2021   BILITOT 2.2 (H) 05/09/2021   ALKPHOS 60 05/09/2021   AST 28 05/09/2021   ALT 27 05/09/2021   PROT 6.8 05/09/2021   ALBUMIN 3.9 05/09/2021   CALCIUM 9.2 05/09/2021   GFRAA 55 (L) 10/21/2019   Labs from patient's PCPs office: Oct 13, 2021 CBC WBC 6.7, hemoglobin 14.5, platelets 313, CMP creatinine 1.06, AST 22, ALT 26, vitamin D 62.9, LDL 92  Speciality Comments: Fosamax 12/21  Procedures:  Large Joint Inj on 02/23/2022 8:40 AM Indications: pain Details: 27 G 1.5 in needle, medial approach  Arthrogram: No  Medications: 40 mg triamcinolone acetonide 40 MG/ML; 1.5 mL lidocaine 1 % Aspirate: 0 mL Outcome: tolerated well, no immediate complications Procedure, treatment alternatives, risks and benefits explained, specific risks discussed. Consent was given by the patient. Immediately prior to procedure a time out was called to verify the correct patient, procedure, equipment, support staff and site/side marked as required. Patient was prepped and draped in the usual sterile fashion.     Allergies: Adhesive [tape]   Assessment / Plan:     Visit Diagnoses: Age-related osteoporosis without current pathological fracture - previously treated with Boniva and Fosamax 16 years ago.  December 17, 2019 left femoral neck BMD 0.647, T score -2.9. Fosamax 70 mg po q week started 07/2020.  Patient has been tolerating Fosamax without any side effects.  She has been also taking calcium and vitamin D.  Resistive exercises were discussed.  Patient states that she has a DEXA scan is scheduled towards the end of the year.  History of vitamin D  deficiency-she has been taking vitamin D.  Medication monitoring encounter - patient gets labs with her PCP.   Primary osteoarthritis of both hands-she bilateral PIP and DIP thickening with no synovitis.  Trigger finger, right middle finger-patient reports intermittent triggering.  I advised her to contact us if it becomes persistent issue and we may consider  injecting it.  Primary osteoarthritis of both knees - moderate OA and moderate chondromalacia patella.  -She had good response to cortisone injection in April 2023.  She wants a repeat injection today.  After informed consent was obtained right knee joint was injected with lidocaine and cortisone as described above.  Patient tolerated the procedure well.  Postprocedure instructions were given.  I will also obtain baseline x-rays today.  We do not have x-rays in the system.  Plan: XR KNEE 3 VIEW RIGHT, XR KNEE 3 VIEW LEFT.  X-rays showed bilateral moderate osteoarthritis and moderate chondromalacia patella.  DDD (degenerative disc disease), lumbar-she continues to have some lower back pain.  Core strengthening exercises were discussed and were placed in the AVS.  Adenocarcinoma of right lung, stage 2 ([place]) - diagnosed with adenocarcinoma stage 2.  She had a partial lobectomy of the right lung and s/p CTX.  S/P partial lobectomy of lung  Dyslipidemia  History of gastroesophageal reflux (GERD)  Orders: Orders Placed This Encounter  Procedures   Large Joint Inj   XR KNEE 3 VIEW RIGHT   XR KNEE 3 VIEW LEFT   No orders of the defined types were placed in this encounter.    Follow-Up Instructions: Return in about 6 months (around 08/25/2022) for Osteoarthritis.   Bo Merino, MD  Note - This record has been created using Editor, commissioning.  Chart creation errors have been sought, but may not always  have been located. Such creation errors do not reflect on  the standard of medical care.

## 2022-02-23 ENCOUNTER — Encounter: Payer: Self-pay | Admitting: Rheumatology

## 2022-02-23 ENCOUNTER — Ambulatory Visit: Payer: Medicare (Managed Care) | Attending: Physician Assistant | Admitting: Rheumatology

## 2022-02-23 ENCOUNTER — Ambulatory Visit (INDEPENDENT_AMBULATORY_CARE_PROVIDER_SITE_OTHER): Payer: Medicare (Managed Care)

## 2022-02-23 VITALS — BP 127/84 | HR 73 | Resp 17 | Ht 68.0 in | Wt 230.4 lb

## 2022-02-23 DIAGNOSIS — G5701 Lesion of sciatic nerve, right lower limb: Secondary | ICD-10-CM

## 2022-02-23 DIAGNOSIS — Z8639 Personal history of other endocrine, nutritional and metabolic disease: Secondary | ICD-10-CM | POA: Diagnosis not present

## 2022-02-23 DIAGNOSIS — M65331 Trigger finger, right middle finger: Secondary | ICD-10-CM | POA: Diagnosis not present

## 2022-02-23 DIAGNOSIS — M1711 Unilateral primary osteoarthritis, right knee: Secondary | ICD-10-CM | POA: Diagnosis not present

## 2022-02-23 DIAGNOSIS — M51369 Other intervertebral disc degeneration, lumbar region without mention of lumbar back pain or lower extremity pain: Secondary | ICD-10-CM

## 2022-02-23 DIAGNOSIS — M5136 Other intervertebral disc degeneration, lumbar region: Secondary | ICD-10-CM | POA: Diagnosis not present

## 2022-02-23 DIAGNOSIS — M170 Bilateral primary osteoarthritis of knee: Secondary | ICD-10-CM

## 2022-02-23 DIAGNOSIS — Z902 Acquired absence of lung [part of]: Secondary | ICD-10-CM | POA: Diagnosis not present

## 2022-02-23 DIAGNOSIS — C3491 Malignant neoplasm of unspecified part of right bronchus or lung: Secondary | ICD-10-CM

## 2022-02-23 DIAGNOSIS — E785 Hyperlipidemia, unspecified: Secondary | ICD-10-CM | POA: Diagnosis not present

## 2022-02-23 DIAGNOSIS — M19042 Primary osteoarthritis, left hand: Secondary | ICD-10-CM | POA: Diagnosis not present

## 2022-02-23 DIAGNOSIS — M19041 Primary osteoarthritis, right hand: Secondary | ICD-10-CM | POA: Diagnosis not present

## 2022-02-23 DIAGNOSIS — M810 Age-related osteoporosis without current pathological fracture: Secondary | ICD-10-CM | POA: Diagnosis not present

## 2022-02-23 DIAGNOSIS — Z8719 Personal history of other diseases of the digestive system: Secondary | ICD-10-CM | POA: Diagnosis not present

## 2022-02-23 DIAGNOSIS — Z5181 Encounter for therapeutic drug level monitoring: Secondary | ICD-10-CM | POA: Diagnosis not present

## 2022-02-23 DIAGNOSIS — M1712 Unilateral primary osteoarthritis, left knee: Secondary | ICD-10-CM | POA: Diagnosis not present

## 2022-02-23 MED ORDER — TRIAMCINOLONE ACETONIDE 40 MG/ML IJ SUSP
40.0000 mg | INTRAMUSCULAR | Status: AC | PRN
  Administered 2022-02-23: 40 mg via INTRA_ARTICULAR

## 2022-02-23 MED ORDER — LIDOCAINE HCL 1 % IJ SOLN
1.5000 mL | INTRAMUSCULAR | Status: AC | PRN
  Administered 2022-02-23: 1.5 mL

## 2022-02-23 NOTE — Patient Instructions (Signed)

## 2022-03-08 ENCOUNTER — Ambulatory Visit: Payer: Medicare (Managed Care) | Admitting: Physician Assistant

## 2022-04-18 ENCOUNTER — Ambulatory Visit
Admission: RE | Admit: 2022-04-18 | Discharge: 2022-04-18 | Disposition: A | Discharge status: Home / Self Care | Payer: Medicare (Managed Care) | Source: Ambulatory Visit | Attending: Internal Medicine | Admitting: Internal Medicine

## 2022-04-18 DIAGNOSIS — M8588 Other specified disorders of bone density and structure, other site: Secondary | ICD-10-CM | POA: Diagnosis not present

## 2022-04-18 DIAGNOSIS — M810 Age-related osteoporosis without current pathological fracture: Secondary | ICD-10-CM

## 2022-04-18 DIAGNOSIS — Z780 Asymptomatic menopausal state: Secondary | ICD-10-CM | POA: Diagnosis not present

## 2022-04-18 NOTE — Progress Notes (Signed)
Bone density is a stable but is still in the osteoporosis range.  We will discuss treatment options at the follow-up visit.

## 2022-04-20 ENCOUNTER — Other Ambulatory Visit (HOSPITAL_BASED_OUTPATIENT_CLINIC_OR_DEPARTMENT_OTHER): Payer: Self-pay

## 2022-04-20 MED ORDER — COMIRNATY 30 MCG/0.3ML IM SUSY
PREFILLED_SYRINGE | INTRAMUSCULAR | 0 refills | Status: DC
  Filled 2022-04-20: qty 0.3, 1d supply, fill #0

## 2022-05-08 ENCOUNTER — Inpatient Hospital Stay: Payer: Medicare (Managed Care) | Attending: Internal Medicine

## 2022-05-08 ENCOUNTER — Ambulatory Visit (HOSPITAL_COMMUNITY)
Admission: RE | Admit: 2022-05-08 | Discharge: 2022-05-08 | Disposition: A | Discharge status: Home / Self Care | Payer: Medicare (Managed Care) | Source: Ambulatory Visit | Attending: Internal Medicine | Admitting: Internal Medicine

## 2022-05-08 ENCOUNTER — Other Ambulatory Visit: Payer: Self-pay

## 2022-05-08 DIAGNOSIS — Z85118 Personal history of other malignant neoplasm of bronchus and lung: Secondary | ICD-10-CM | POA: Insufficient documentation

## 2022-05-08 DIAGNOSIS — I700 Atherosclerosis of aorta: Secondary | ICD-10-CM | POA: Insufficient documentation

## 2022-05-08 DIAGNOSIS — C3490 Malignant neoplasm of unspecified part of unspecified bronchus or lung: Secondary | ICD-10-CM | POA: Diagnosis not present

## 2022-05-08 DIAGNOSIS — R911 Solitary pulmonary nodule: Secondary | ICD-10-CM | POA: Diagnosis not present

## 2022-05-08 DIAGNOSIS — K449 Diaphragmatic hernia without obstruction or gangrene: Secondary | ICD-10-CM | POA: Diagnosis not present

## 2022-05-08 DIAGNOSIS — R5383 Other fatigue: Secondary | ICD-10-CM | POA: Insufficient documentation

## 2022-05-08 DIAGNOSIS — K769 Liver disease, unspecified: Secondary | ICD-10-CM | POA: Insufficient documentation

## 2022-05-08 LAB — CBC WITH DIFFERENTIAL (CANCER CENTER ONLY)
Abs Immature Granulocytes: 0.03 10*3/uL (ref 0.00–0.07)
Basophils Absolute: 0.1 10*3/uL (ref 0.0–0.1)
Basophils Relative: 1 %
Eosinophils Absolute: 0.3 10*3/uL (ref 0.0–0.5)
Eosinophils Relative: 4 %
HCT: 41.8 % (ref 36.0–46.0)
Hemoglobin: 14.7 g/dL (ref 12.0–15.0)
Immature Granulocytes: 0 %
Lymphocytes Relative: 36 %
Lymphs Abs: 2.6 10*3/uL (ref 0.7–4.0)
MCH: 32.4 pg (ref 26.0–34.0)
MCHC: 35.2 g/dL (ref 30.0–36.0)
MCV: 92.1 fL (ref 80.0–100.0)
Monocytes Absolute: 0.7 10*3/uL (ref 0.1–1.0)
Monocytes Relative: 10 %
Neutro Abs: 3.4 10*3/uL (ref 1.7–7.7)
Neutrophils Relative %: 49 %
Platelet Count: 276 10*3/uL (ref 150–400)
RBC: 4.54 MIL/uL (ref 3.87–5.11)
RDW: 12.3 % (ref 11.5–15.5)
WBC Count: 7.2 10*3/uL (ref 4.0–10.5)
nRBC: 0 % (ref 0.0–0.2)

## 2022-05-08 LAB — CMP (CANCER CENTER ONLY)
ALT: 24 U/L (ref 0–44)
AST: 25 U/L (ref 15–41)
Albumin: 4.2 g/dL (ref 3.5–5.0)
Alkaline Phosphatase: 61 U/L (ref 38–126)
Anion gap: 6 (ref 5–15)
BUN: 14 mg/dL (ref 8–23)
CO2: 31 mmol/L (ref 22–32)
Calcium: 10.5 mg/dL — ABNORMAL HIGH (ref 8.9–10.3)
Chloride: 104 mmol/L (ref 98–111)
Creatinine: 1.23 mg/dL — ABNORMAL HIGH (ref 0.44–1.00)
GFR, Estimated: 46 mL/min — ABNORMAL LOW (ref >60)
Glucose, Bld: 102 mg/dL — ABNORMAL HIGH (ref 70–99)
Potassium: 3.9 mmol/L (ref 3.5–5.1)
Sodium: 141 mmol/L (ref 135–145)
Total Bilirubin: 1.5 mg/dL — ABNORMAL HIGH (ref 0.3–1.2)
Total Protein: 7 g/dL (ref 6.5–8.1)

## 2022-05-08 MED ORDER — SODIUM CHLORIDE (PF) 0.9 % IJ SOLN
INTRAMUSCULAR | Status: AC
  Filled 2022-05-08: qty 50

## 2022-05-08 MED ORDER — IOHEXOL 300 MG/ML  SOLN
75.0000 mL | Freq: Once | INTRAMUSCULAR | Status: AC | PRN
  Administered 2022-05-08: 75 mL via INTRAVENOUS

## 2022-05-11 ENCOUNTER — Other Ambulatory Visit: Payer: Self-pay

## 2022-05-11 ENCOUNTER — Inpatient Hospital Stay (HOSPITAL_BASED_OUTPATIENT_CLINIC_OR_DEPARTMENT_OTHER): Payer: Medicare (Managed Care) | Admitting: Internal Medicine

## 2022-05-11 VITALS — BP 126/74 | HR 86 | Temp 98.3°F | Resp 16 | Wt 233.1 lb

## 2022-05-11 DIAGNOSIS — K769 Liver disease, unspecified: Secondary | ICD-10-CM

## 2022-05-11 DIAGNOSIS — R5383 Other fatigue: Secondary | ICD-10-CM | POA: Diagnosis not present

## 2022-05-11 DIAGNOSIS — Z85118 Personal history of other malignant neoplasm of bronchus and lung: Secondary | ICD-10-CM | POA: Diagnosis not present

## 2022-05-11 NOTE — Progress Notes (Signed)
[place] [phone]   [phone]  OFFICE PROGRESS NOTE  [name], MD [address] [place] [address]  DIAGNOSIS: Stage IIB (T3, N0, M0) non-small cell lung cancer, adenocarcinoma diagnosed in May 2020.  Biomarker Findings Microsatellite status - Cannot Be Determined Tumor Mutational Burden - Cannot Be Determined Genomic Findings For a complete list of the genes assayed, please refer to the Appendix. KRAS G12V TP53 G244V 7 Disease relevant genes with no reportable alterations: ALK, BRAF, EGFR, ERBB2, MET, RET, ROS1   PDL 1 expression: negative   PRIOR THERAPY:  1) Status post wedge resection x3 of the right lower lobe with lymph node sampling under the care of Dr. Roxan Hockey on 10/09/2018. 2) Adjuvant systemic chemotherapy with cisplatin 75 mg/M2 and Alimta 500 mg/M2 every 3 weeks.  First dose November 22, 2018.  Status post 4 cycles.  Last dose was given on 01/28/2019.  CURRENT THERAPY: Observation.  INTERVAL HISTORY: Stephanie Crawford 76 y.o. female returns to the clinic today for follow-up visit.  The patient is feeling fine today with no concerning complaints.  She has no chest pain, shortness of breath, cough or hemoptysis.  She has no nausea, vomiting, diarrhea or constipation.  She has no headache or visual changes.  She has no significant weight loss or night sweats.  She is here today for evaluation with repeat CT scan of the chest for restaging of her disease.   MEDICAL HISTORY: Past Medical History:  Diagnosis Date   Allergic rhinitis    Allergy    Anemia    prior to hysterectomy--1997   Arthritis    GERD (gastroesophageal reflux disease)    Heart murmur    Hemorrhoid 2011   History of shingles 04/2014   Kidney stones    Lung cancer ([place])    Nodule of lower lobe of right lung    Osteoporosis    Varicose veins of left lower extremity     ALLERGIES:  is allergic to adhesive  [tape].  MEDICATIONS:  Current Outpatient Medications  Medication Sig Dispense Refill   alendronate (FOSAMAX) 70 MG tablet Take by mouth once a week.     aspirin 325 MG EC tablet Take 650 mg by mouth daily as needed for pain.     atorvastatin (LIPITOR) 10 MG tablet Take 1 tablet by mouth daily.     azelastine (OPTIVAR) 0.05 % ophthalmic solution Place 1 drop into both eyes daily as needed (irritation).     calcium carbonate (OS-CAL) 600 MG TABS tablet Take 1,200 mg by mouth every evening.      Cholecalciferol (VITAMIN D) 50 MCG (2000 UT) tablet Take 2,000 Units by mouth at bedtime.     diclofenac Sodium (VOLTAREN) 1 % GEL Apply 2-4 grams to affected joint 4 times daily as needed. 400 g 2   famotidine (PEPCID) 20 MG tablet Take 20 mg by mouth at bedtime.     ipratropium (ATROVENT) 0.06 % nasal spray SMARTSIG:1-2 Spray(s) Both Nares Twice Daily PRN     No current facility-administered medications for this visit.    SURGICAL HISTORY:  Past Surgical History:  Procedure Laterality Date   ABDOMINAL HYSTERECTOMY  1997   APPENDECTOMY  1960   BREAST EXCISIONAL BIOPSY Left    decades ago- "something was removed"   HEMORRHOID SURGERY  01/2010   8/27 had a follow up procedure.     IR THORACENTESIS ASP PLEURAL SPACE W/IMG GUIDE  10/24/2018  LIPOMA EXCISION  1975   neck, left breast and righ jaw.   VIDEO ASSISTED THORACOSCOPY (VATS)/WEDGE RESECTION Right 10/09/2018   Procedure: VIDEO ASSISTED THORACOSCOPY (VATS)/WEDGE RESECTION;  Surgeon: Melrose Nakayama, MD;  Location: [facility];  Service: Thoracic;  Laterality: Right;   WRIST FRACTURE SURGERY      REVIEW OF SYSTEMS:  A comprehensive review of systems was negative.   PHYSICAL EXAMINATION: General appearance: alert, cooperative, and no distress Head: Normocephalic, without obvious abnormality, atraumatic Neck: no adenopathy, no JVD, supple, symmetrical, trachea midline, and thyroid not enlarged, symmetric, no  tenderness/mass/nodules Lymph nodes: Cervical, supraclavicular, and axillary nodes normal. Resp: clear to auscultation bilaterally Back: symmetric, no curvature. ROM normal. No CVA tenderness. Cardio: regular rate and rhythm, S1, S2 normal, no murmur, click, rub or gallop GI: soft, non-tender; bowel sounds normal; no masses,  no organomegaly Extremities: extremities normal, atraumatic, no cyanosis or edema  ECOG PERFORMANCE STATUS: 1 - Symptomatic but completely ambulatory  Blood pressure 138/90, pulse 86, temperature 97.6 F (36.4 C), temperature source Tympanic, resp. rate 18, height _0  (1.727 m), weight 231 lb 7 oz (105 kg), last menstrual period 05/23/1995, SpO2 99 %.  LABORATORY DATA: Lab Results  Component Value Date   WBC 6.2 05/09/2021   HGB 13.7 05/09/2021   HCT 40.5 05/09/2021   MCV 91.0 05/09/2021   PLT 270 05/09/2021      Chemistry      Component Value Date/Time   NA 141 05/09/2021 1439   K 3.8 05/09/2021 1439   CL 108 05/09/2021 1439   CO2 23 05/09/2021 1439   BUN 14 05/09/2021 1439   CREATININE 1.02 (H) 05/09/2021 1439   CREATININE 0.75 07/14/2014 1231      Component Value Date/Time   CALCIUM 9.2 05/09/2021 1439   CALCIUM 10.1 12/28/2009 2231   ALKPHOS 60 05/09/2021 1439   AST 28 05/09/2021 1439   ALT 27 05/09/2021 1439   BILITOT 2.2 (H) 05/09/2021 1439       RADIOGRAPHIC STUDIES: CT Chest W Contrast  Result Date: 05/09/2022 CLINICAL DATA:  76 year old female presents for follow-up of non-small cell lung cancer, staging evaluation. * Tracking Code: BO * EXAM: CT CHEST WITH CONTRAST TECHNIQUE: Multidetector CT imaging of the chest was performed during intravenous contrast administration. RADIATION DOSE REDUCTION: This exam was performed according to the departmental dose-optimization program which includes automated exposure control, adjustment of the mA and/or kV according to patient size and/or use of iterative reconstruction technique. CONTRAST:   4m OMNIPAQUE IOHEXOL 300 MG/ML  SOLN COMPARISON:  May 09, 2021 FINDINGS: Cardiovascular: Calcified and noncalcified aortic atherosclerosis without aneurysmal dilation. Distortion of the RIGHT hilum following wedge resection in the RIGHT lower lobe similar to prior imaging. Central pulmonary vasculature otherwise unremarkable. Heart size normal without pericardial effusion or nodularity. Mediastinum/Nodes: Thoracic inlet structures are normal. No signs of axillary lymphadenopathy. No mediastinal lymphadenopathy. No hilar lymphadenopathy. Lungs/Pleura: Parenchymal distortion in the RIGHT lower chest without effusion or dense consolidative changes. No change in the appearance of postoperative findings in the RIGHT lower chest since previous imaging. Subtle nodule just above the RIGHT hemidiaphragm 4 mm, stable. Airways are patent. Upper Abdomen: Incidental imaging of upper abdominal contents without acute process Low-density hepatic lesion not definitely seen on previous imaging measuring 7 mm (image 107/2) this is in the anterior RIGHT hepatic lobe near the dome of the RIGHT hemiliver. No acute findings in the upper abdomen. Small hiatal hernia. Mildly patulous esophagus. Imaged portions of adrenal glands, spleen, pancreas  and kidneys are unremarkable. Musculoskeletal: No acute bone finding. No destructive bone process. Spinal degenerative changes. IMPRESSION: 1. Stable postoperative changes in the RIGHT lower lobe with subtle nodularity just above the RIGHT hemidiaphragm and architectural distortion. No signs of disease recurrence in the chest. 2. Low-density hepatic lesion not definitely seen on previous imaging measuring 7 mm. This is in the anterior RIGHT hepatic lobe near the dome of the RIGHT hemiliver. Low-density lesion in the dome of the RIGHT hemiliver cannot be seen on previous imaging studies. This may represent a small benign lesion not seen due to technical factors but the possibility of  metastatic process is considered. Suggest MRI with without contrast of the abdomen for further evaluation. 3. Small hiatal hernia. 4. Aortic atherosclerosis. Aortic Atherosclerosis (ICD10-I70.0). Electronically Signed   By: Zetta Bills M.D.   On: 05/09/2022 15:02   DG BONE DENSITY (DXA)  Result Date: 04/18/2022 EXAM: DUAL X-RAY ABSORPTIOMETRY (DXA) FOR BONE MINERAL DENSITY IMPRESSION: Referring Physician:  [name] Your patient completed a bone mineral density test using GE Lunar iDXA system (analysis version: 16). Technologist: [email or web address] PATIENT: Name: Stephanie Crawford, Stephanie Crawford Patient ID: [id number] Birth Date: 1946-05-22 Height: 67.8 in. Sex: Female Measured: 04/18/2022 Weight: 233.4 lbs. Indications: Advanced Age, Estrogen Deficient, History of Fracture (Adult) (V15.51), Hysterectomy, Postmenopausal Fractures: Ankle, NONE Treatments: Calcium (E943.0), Fosamax, Vitamin D (E933.5) ASSESSMENT: The BMD measured at Femur Neck Right is 0.635 g/cm2 with a T-score of -2.9. This patient is considered osteoporotic according to [facility]) criteria. The quality of the exam is good. L3 was excluded due to degenerative changes noted on previous exam. Site Region Measured Date Measured Age YA BMD Significant CHANGE T-score AP Spine L1-L4 (L3) 5/18/2023 75.7 -2.2 0.908 g/cm2 * AP Spine  L1-L4 (L3) 12/17/2019    73.4         -2.9    0.824 g/cm2 DualFemur Neck Right 04/18/2022    75.7         -2.9    0.635 g/cm2 DualFemur Neck Right 12/17/2019    73.4         -2.8    0.654 g/cm2 DualFemur Total Mean 04/18/2022 75.7 -2.4 0.701 g/cm2 * DualFemur Total Mean 12/17/2019    73.4         -2.7    0.662 g/cm2 World Health Organization [facility]) criteria for post-menopausal, Caucasian Women: Normal       T-score at or above -1 SD Osteopenia   T-score between -1 and -2.5 SD Osteoporosis T-score at or below -2.5 SD RECOMMENDATION: 1. All patients should optimize calcium and vitamin D intake. 2. Consider  FDA-approved medical therapies in postmenopausal women and men aged 37 years and older, based on the following: a. A hip or vertebral (clinical or morphometric) fracture. b. T-score = -2.5 at the femoral neck or spine after appropriate evaluation to exclude secondary causes. c. Low bone mass (T-score between -1.0 and -2.5 at the femoral neck or spine) and a 10-year probability of a hip fracture = 3% or a 10-year probability of a major osteoporosis-related fracture = 20% based on the [place]-adapted WHO algorithm. d. Clinician judgment and/or patient preferences may indicate treatment for people with 10-year fracture probabilities above or below these levels. FOLLOW-UP: Patients with diagnosis of osteoporosis or at high risk for fracture should have regular bone mineral density tests. Patients eligible for Medicare are allowed routine testing every 2 years. The testing frequency can be increased to one year for patients who have rapidly progressing  disease, are receiving or discontinuing medical therapy to restore bone mass, or have additional risk factors. I have reviewed this study and agree with the findings. [facility] Electronically Signed   By: Ammie Ferrier M.D.   On: 04/18/2022 11:47    ASSESSMENT AND PLAN: This is a very pleasant 76  years old African-American female with stage IIb (T3, N0, M0) non-small cell lung cancer, adenocarcinoma with multifocal disease in the right lower lobe status post wedge resection x3 of the right lower lobe with lymph node sampling in May 2020. The patient has no actionable mutations and she has negative PDL 1 expression. She underwent 4 cycles of adjuvant systemic chemotherapy with cisplatin and Alimta.  She tolerated her treatment well with no concerning complaints except for fatigue. The patient has been on observation now for more than 3 years and she is feeling fine. She had repeat CT scan of the chest performed recently.  I personally and  independently reviewed the scan images and discussed the results with the patient today. Her scan showed no concerning findings for disease recurrence or metastasis but there was a low-density hepatic lesion noted seen on the previous imaging studies and measuring 0.7 cm in size. I recommended for the patient to have MRI of the abdomen with and without contrast to rule out any metastatic disease to the liver. I will see her back for follow-up visit in around 2-3 weeks for evaluation and discussion of her scan results and any additional treatment options. If the MRI is negative for malignancy, I will see her back for follow-up visit in 1 year with repeat CT scan of the chest. She was advised to call immediately if she has any other concerning symptoms in the interval. The patient voices understanding of current disease status and treatment options and is in agreement with the current care plan. All questions were answered. The patient knows to call the clinic with any problems, questions or concerns. We can certainly see the patient much sooner if necessary.  Disclaimer: This note was dictated with voice recognition software. Similar sounding words can inadvertently be transcribed and may not be corrected upon review.

## 2022-05-26 ENCOUNTER — Ambulatory Visit (HOSPITAL_COMMUNITY)
Admission: RE | Admit: 2022-05-26 | Discharge: 2022-05-26 | Disposition: A | Discharge status: Home / Self Care | Payer: Medicare (Managed Care) | Source: Ambulatory Visit | Attending: Internal Medicine | Admitting: Internal Medicine

## 2022-05-26 DIAGNOSIS — K5730 Diverticulosis of large intestine without perforation or abscess without bleeding: Secondary | ICD-10-CM | POA: Diagnosis not present

## 2022-05-26 DIAGNOSIS — Z8505 Personal history of malignant neoplasm of liver: Secondary | ICD-10-CM | POA: Diagnosis not present

## 2022-05-26 DIAGNOSIS — K769 Liver disease, unspecified: Secondary | ICD-10-CM | POA: Diagnosis not present

## 2022-05-26 DIAGNOSIS — Z85118 Personal history of other malignant neoplasm of bronchus and lung: Secondary | ICD-10-CM | POA: Diagnosis not present

## 2022-05-26 MED ORDER — GADOBUTROL 1 MMOL/ML IV SOLN
10.0000 mL | Freq: Once | INTRAVENOUS | Status: AC | PRN
Start: 2022-05-26 — End: 2022-05-26
  Administered 2022-05-26: 10 mL via INTRAVENOUS

## 2022-05-29 ENCOUNTER — Telehealth: Payer: Self-pay | Admitting: Internal Medicine

## 2022-05-29 NOTE — Telephone Encounter (Signed)
Called patient regarding upcoming January appointments, left a voicemail.

## 2022-06-01 ENCOUNTER — Inpatient Hospital Stay: Payer: Medicare (Managed Care) | Admitting: Internal Medicine

## 2022-06-14 ENCOUNTER — Inpatient Hospital Stay: Payer: Medicare (Managed Care) | Attending: Internal Medicine | Admitting: Internal Medicine

## 2022-06-14 ENCOUNTER — Other Ambulatory Visit: Payer: Self-pay

## 2022-06-14 VITALS — BP 132/74 | HR 88 | Temp 98.4°F | Resp 16 | Wt 233.3 lb

## 2022-06-14 DIAGNOSIS — R5383 Other fatigue: Secondary | ICD-10-CM | POA: Diagnosis not present

## 2022-06-14 DIAGNOSIS — Z79899 Other long term (current) drug therapy: Secondary | ICD-10-CM | POA: Insufficient documentation

## 2022-06-14 DIAGNOSIS — Z9221 Personal history of antineoplastic chemotherapy: Secondary | ICD-10-CM | POA: Insufficient documentation

## 2022-06-14 DIAGNOSIS — C3431 Malignant neoplasm of lower lobe, right bronchus or lung: Secondary | ICD-10-CM | POA: Diagnosis not present

## 2022-06-14 DIAGNOSIS — Z85118 Personal history of other malignant neoplasm of bronchus and lung: Secondary | ICD-10-CM | POA: Diagnosis not present

## 2022-06-14 DIAGNOSIS — C3490 Malignant neoplasm of unspecified part of unspecified bronchus or lung: Secondary | ICD-10-CM

## 2022-06-14 NOTE — Progress Notes (Signed)
[place] Telephone:([phone]   [phone]  OFFICE PROGRESS NOTE  [name], MD [address] [place] [address]  DIAGNOSIS: Stage IIB (T3, N0, M0) non-small cell lung cancer, adenocarcinoma diagnosed in May 2020.  Biomarker Findings Microsatellite status - Cannot Be Determined Tumor Mutational Burden - Cannot Be Determined Genomic Findings For a complete list of the genes assayed, please refer to the Appendix. KRAS G12V TP53 G244V 7 Disease relevant genes with no reportable alterations: ALK, BRAF, EGFR, ERBB2, MET, RET, ROS1   PDL 1 expression: negative   PRIOR THERAPY:  1) Status post wedge resection x3 of the right lower lobe with lymph node sampling under the care of Dr. Roxan Hockey on 10/09/2018. 2) Adjuvant systemic chemotherapy with cisplatin 75 mg/M2 and Alimta 500 mg/M2 every 3 weeks.  First dose November 22, 2018.  Status post 4 cycles.  Last dose was given on 01/28/2019.  CURRENT THERAPY: Observation.  INTERVAL HISTORY: Stephanie Crawford 77 y.o. female returns to the clinic today for follow-up visit.  The patient is feeling fine today with no concerning complaints except for itching.  She denied having any current chest pain, shortness of breath, cough or hemoptysis.  She has no nausea, vomiting, diarrhea or constipation.  She has no headache or visual changes.  She denied having any significant weight loss or night sweats.  She was found on previous CT scan of the chest to have suspicious lesion in the liver and MRI of the liver was recommended.  The patient had the MRI performed recently and she is here for evaluation and discussion of her imaging study results.  MEDICAL HISTORY: Past Medical History:  Diagnosis Date   Allergic rhinitis    Allergy    Anemia    prior to hysterectomy--1997   Arthritis    GERD (gastroesophageal reflux disease)    Heart murmur    Hemorrhoid 2011   History of shingles  04/2014   Kidney stones    Lung cancer ([place])    Nodule of lower lobe of right lung    Osteoporosis    Varicose veins of left lower extremity     ALLERGIES:  is allergic to adhesive [tape].  MEDICATIONS:  Current Outpatient Medications  Medication Sig Dispense Refill   alendronate (FOSAMAX) 70 MG tablet Take by mouth once a week.     aspirin 325 MG EC tablet Take 650 mg by mouth daily as needed for pain.     atorvastatin (LIPITOR) 10 MG tablet Take 1 tablet by mouth daily.     azelastine (OPTIVAR) 0.05 % ophthalmic solution Place 1 drop into both eyes daily as needed (irritation).     calcium carbonate (OS-CAL) 600 MG TABS tablet Take 1,200 mg by mouth every evening.      Cholecalciferol (VITAMIN D) 50 MCG (2000 UT) tablet Take 2,000 Units by mouth at bedtime.     diclofenac Sodium (VOLTAREN) 1 % GEL Apply 2-4 grams to affected joint 4 times daily as needed. 400 g 2   famotidine (PEPCID) 20 MG tablet Take 20 mg by mouth at bedtime.     ipratropium (ATROVENT) 0.06 % nasal spray SMARTSIG:1-2 Spray(s) Both Nares Twice Daily PRN     No current facility-administered medications for this visit.    SURGICAL HISTORY:  Past Surgical History:  Procedure Laterality Date   ABDOMINAL HYSTERECTOMY  1997   APPENDECTOMY  1960   BREAST EXCISIONAL BIOPSY Left  decades ago- "something was removed"   HEMORRHOID SURGERY  01/2010   8/27 had a follow up procedure.     IR THORACENTESIS ASP PLEURAL SPACE W/IMG GUIDE  10/24/2018   LIPOMA EXCISION  1975   neck, left breast and righ jaw.   VIDEO ASSISTED THORACOSCOPY (VATS)/WEDGE RESECTION Right 10/09/2018   Procedure: VIDEO ASSISTED THORACOSCOPY (VATS)/WEDGE RESECTION;  Surgeon: Melrose Nakayama, MD;  Location: [place];  Service: Thoracic;  Laterality: Right;   WRIST FRACTURE SURGERY      REVIEW OF SYSTEMS:  A comprehensive review of systems was negative except for: Integument/breast: positive for pruritus   PHYSICAL EXAMINATION: General  appearance: alert, cooperative, and no distress Head: Normocephalic, without obvious abnormality, atraumatic Neck: no adenopathy, no JVD, supple, symmetrical, trachea midline, and thyroid not enlarged, symmetric, no tenderness/mass/nodules Lymph nodes: Cervical, supraclavicular, and axillary nodes normal. Resp: clear to auscultation bilaterally Back: symmetric, no curvature. ROM normal. No CVA tenderness. Cardio: regular rate and rhythm, S1, S2 normal, no murmur, click, rub or gallop GI: soft, non-tender; bowel sounds normal; no masses,  no organomegaly Extremities: extremities normal, atraumatic, no cyanosis or edema  ECOG PERFORMANCE STATUS: 1 - Symptomatic but completely ambulatory  Blood pressure 138/90, pulse 86, temperature 97.6 F (36.4 C), temperature source Tympanic, resp. rate 18, height 5\' 8"  (1.727 m), weight 231 lb 7 oz (105 kg), last menstrual period 05/23/1995, SpO2 99 %.  LABORATORY DATA: Lab Results  Component Value Date   WBC 6.2 05/09/2021   HGB 13.7 05/09/2021   HCT 40.5 05/09/2021   MCV 91.0 05/09/2021   PLT 270 05/09/2021      Chemistry      Component Value Date/Time   NA 141 05/09/2021 1439   K 3.8 05/09/2021 1439   CL 108 05/09/2021 1439   CO2 23 05/09/2021 1439   BUN 14 05/09/2021 1439   CREATININE 1.02 (H) 05/09/2021 1439   CREATININE 0.75 07/14/2014 1231      Component Value Date/Time   CALCIUM 9.2 05/09/2021 1439   CALCIUM 10.1 12/28/2009 2231   ALKPHOS 60 05/09/2021 1439   AST 28 05/09/2021 1439   ALT 27 05/09/2021 1439   BILITOT 2.2 (H) 05/09/2021 1439       RADIOGRAPHIC STUDIES: MR LIVER W WO CONTRAST  Result Date: 05/29/2022 CLINICAL DATA:  New liver lesion on chest CT. History of non-small cell lung cancer. EXAM: MRI ABDOMEN WITHOUT AND WITH CONTRAST TECHNIQUE: Multiplanar multisequence MR imaging of the abdomen was performed both before and after the administration of intravenous contrast. CONTRAST:  42mL GADAVIST GADOBUTROL 1 MMOL/ML  IV SOLN COMPARISON:  Chest CT 05/08/2022 and 05/09/2021.  PET-CT 08/15/2018 FINDINGS: Despite efforts by the technologist and patient, mild motion artifact is present on today's exam and could not be eliminated. This reduces exam sensitivity and specificity. Motion is greatest on the delayed post-contrast images. Lower chest: Grossly stable scarring at the right lung base. No significant findings are identified within the visualized lower chest. Hepatobiliary: The liver is normal in morphology without steatosis or suspicious lesion. The lesion of concern previously seen anteriorly in the right hepatic lobe (segment 8) is a simple cyst measuring 6 mm in diameter on image 13/4. This demonstrates no restricted diffusion or abnormal enhancement. No evidence of gallstones, gallbladder wall thickening or biliary dilatation. Pancreas: Unremarkable. No pancreatic ductal dilatation or surrounding inflammatory changes. Spleen: Normal in size without focal abnormality. Adrenals/Urinary Tract: Both adrenal glands appear normal. No evidence of hydronephrosis or suspicious renal lesion. There are  subcentimeter renal cysts bilaterally for which no specific follow-up is recommended. Stomach/Bowel: Diffuse colonic diverticulosis noted. No acute bowel abnormalities are seen. Vascular/Lymphatic: There are no enlarged abdominal lymph nodes. No significant vascular findings. Other: No evidence of abdominal wall hernia or ascites. Musculoskeletal: No acute or significant osseous findings. Mild spondylosis. IMPRESSION: 1. The lesion of concern previously seen anteriorly in the right hepatic lobe is a simple cyst. No suspicious hepatic findings. 2. No acute abdominal findings or evidence of metastatic disease. 3. Colonic diverticulosis. Electronically Signed   By: Richardean Sale M.D.   On: 05/29/2022 11:12    ASSESSMENT AND PLAN: This is a very pleasant 77  years old African-American female with stage IIb (T3, N0, M0) non-small cell  lung cancer, adenocarcinoma with multifocal disease in the right lower lobe status post wedge resection x3 of the right lower lobe with lymph node sampling in May 2020. The patient has no actionable mutations and she has negative PDL 1 expression. She underwent 4 cycles of adjuvant systemic chemotherapy with cisplatin and Alimta.  She tolerated her treatment well with no concerning complaints except for fatigue. The patient has been on observation now for more than 3 years and she is feeling fine. Her most recent CT scan showed no concerning findings for disease recurrence or metastasis but there was a low-density hepatic lesion noted seen on the previous imaging studies and measuring 0.7 cm in size. The patient had MRI of the liver performed recently.  I personally and independently reviewed the MRI and discussed the result with the patient today.  Her MRI showed the lesion in the liver is just a simple cyst and no other suspicious lesions. I recommended for her to continue on observation with repeat CT scan of the chest in 1 year. She was advised to call immediately if she has any other concerning symptoms in the interval. All questions were answered. The patient knows to call the clinic with any problems, questions or concerns. We can certainly see the patient much sooner if necessary.  Disclaimer: This note was dictated with voice recognition software. Similar sounding words can inadvertently be transcribed and may not be corrected upon review.

## 2022-08-16 NOTE — Progress Notes (Signed)
Office Visit Note  Patient: Stephanie Crawford             Date of Birth: [birth year]           MRN: [id number]             PCP: Lorenda Ishihara, MD Referring: Lorenda Ishihara,* Visit Date: 08/29/2022 Occupation: @GUAROCC @  Subjective:  Pain in joints  History of Present Illness: Stephanie Crawford is a 77 y.o. female with a history of osteoporosis, osteoarthritis and degenerative disc disease.  She states she has been taking Fosamax on a regular basis since April 2022.  Has been taking calcium and vitamin D also.  She has been walking for exercise and also doing some strength training.  She complains of intermittent triggering of her bilateral middle finger.  She complains of pain and stiffness in her bilateral hands and her knee joints.  She has lower back pain off and on.    Activities of Daily Living:  Patient reports morning stiffness for 5 minutes.   Patient Reports nocturnal pain.  Neuropathy Difficulty dressing/grooming: Denies Difficulty climbing stairs: Denies Difficulty getting out of chair: Denies Difficulty using hands for taps, buttons, cutlery, and/or writing: Denies  Review of Systems  Constitutional:  Negative for fatigue.  HENT:  Positive for mouth dryness.   Eyes:  Negative for dryness.  Respiratory:  Positive for shortness of breath.   Cardiovascular:  Negative for chest pain and palpitations.  Gastrointestinal:  Negative for blood in stool, constipation and diarrhea.  Endocrine: Negative for increased urination.  Genitourinary:  Negative for difficulty urinating.  Musculoskeletal:  Positive for joint pain, joint pain and morning stiffness.  Skin:  Negative for color change, rash and sensitivity to sunlight.  Allergic/Immunologic: Negative for susceptible to infections.  Neurological:  Positive for parasthesias.  Hematological:  Negative for swollen glands.  Psychiatric/Behavioral:  Negative for depressed mood and sleep disturbance. The  patient is not nervous/anxious.     PMFS History:  Patient Active Problem List   Diagnosis Date Noted   Hypertension 02/24/2019   Goals of care, counseling/discussion 11/14/2018   Adenocarcinoma of right lung, stage 2 10/25/2018   Encounter for antineoplastic chemotherapy 10/25/2018   S/P partial lobectomy of lung 10/09/2018   Primary osteoarthritis of both knees 05/17/2017   Primary osteoarthritis of both hands 05/17/2017   DDD (degenerative disc disease), lumbar 05/17/2017   History of gastroesophageal reflux (GERD) 05/17/2017   History of shingles 05/17/2017   History of cellulitis 05/17/2017   Multiple lung nodules on CT 11/30/2016   Cough 11/30/2016   Right knee pain 04/28/2015   Foot ulcer 07/15/2014   Osteoporosis 01/30/2013   Vitamin D deficiency 01/13/2013   Other and unspecified hyperlipidemia 01/06/2013   Breast thickening 01/06/2013   Skin lesion of right leg 09/19/2012   Chronic seasonal allergic rhinitis 09/19/2012   Elevated bilirubin 07/12/2012   History of cardiac murmur 07/12/2012   Rash and nonspecific skin eruption 07/11/2012   Rectal bleeding 05/11/2011   Abnormal EKG 03/01/2011   General medical examination 03/01/2011   GERD 12/08/2009   ANKLE EDEMA 12/08/2009    Past Medical History:  Diagnosis Date   Allergic rhinitis    Allergy    Anemia    prior to hysterectomy--1997   Arthritis    GERD (gastroesophageal reflux disease)    Heart murmur    Hemorrhoid 2011   History of shingles 04/2014   Kidney stones    Lung cancer    Nodule  of lower lobe of right lung    Osteoporosis    Varicose veins of left lower extremity     Family History  Problem Relation Age of Onset   Cancer Father    Prostate cancer Father    Asthma Sister    Heart attack Brother    Prostate cancer Brother    Lung cancer Maternal Grandmother    Asthma Daughter    Colon cancer Neg Hx    Esophageal cancer Neg Hx    Pancreatic cancer Neg Hx    Stomach cancer Neg Hx     Liver disease Neg Hx    Rectal cancer Neg Hx    Breast cancer Neg Hx    Past Surgical History:  Procedure Laterality Date   ABDOMINAL HYSTERECTOMY  1997   APPENDECTOMY  1960   BREAST EXCISIONAL BIOPSY Left    decades ago- "something was removed"   HEMORRHOID SURGERY  01/2010   8/27 had a follow up procedure.     IR THORACENTESIS ASP PLEURAL SPACE W/IMG GUIDE  10/24/2018   LIPOMA EXCISION  1975   neck, left breast and righ jaw.   VIDEO ASSISTED THORACOSCOPY (VATS)/WEDGE RESECTION Right 10/09/2018   Procedure: VIDEO ASSISTED THORACOSCOPY (VATS)/WEDGE RESECTION;  Surgeon: Loreli SlotHendrickson, Steven C, MD;  [facility];  Service: Thoracic;  Laterality: Right;   WRIST FRACTURE SURGERY     Social History   Social History Narrative   [facility] (11/30/16):   Originally from [place], [place]. G[place] up [place] in [place]. She has also lived in [place]. [place]. 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 Recently has acquired an outside dog. No bird exposure. Possible mold problem in her current home with history of water damage. No hot tub exposure. Enjoys babysitting her grandsons. Remote travel to [place], [place], [place], [place], [place], [place], [place], [place], [place], [place]    Immunization History  Administered Date(s) Administered   COVID-19, mRNA, vaccine(Comirnaty)12 years and older 04/20/2022   PFIZER(Purple Top)SARS-COV-2 Vaccination 07/17/2019, 08/13/2019, 04/03/2020   Pfizer Covid-19 Vaccine Bivalent Booster 53103yrs & up 05/18/2021   Td 06/22/2009   Tdap 01/06/2013     Objective: Vital Signs: BP (!) 146/88 (BP Location: Right Arm, Patient Position: Sitting, Cuff Size: Normal)   Pulse 73   Resp 16   Ht 5\' 8"  (1.727 m)   Wt 235 lb (106.6 kg)   LMP 05/23/1995   BMI 35.73 kg/m    Physical Exam Vitals and nursing  note reviewed.  Constitutional:      Appearance: She is well-developed.  HENT:     Head: Normocephalic and atraumatic.  Eyes:     Conjunctiva/sclera: Conjunctivae normal.  Cardiovascular:     Rate and Rhythm: Normal rate and regular rhythm.     Heart sounds: Normal heart sounds.  Pulmonary:     Effort: Pulmonary effort is normal.     Breath sounds: Normal breath sounds.  Abdominal:     General: Bowel sounds are normal.     Palpations: Abdomen is soft.  Musculoskeletal:     Cervical back: Normal range of motion.  Lymphadenopathy:     Cervical: No cervical adenopathy.  Skin:    General: Skin is warm and dry.     Capillary Refill: Capillary refill takes less than 2 seconds.  Neurological:     Mental Status: She is alert  and oriented to person, place, and time.  Psychiatric:        Behavior: Behavior normal.      Musculoskeletal Exam: Cervical spine was in good range of motion.  She had discomfort range of motion of the lumbar spine.  Shoulder joints, elbow joints, wrist joints were in good range of motion.  PIP and DIP thickening with no synovitis was noted.  Hip joints and knee joints were in good range of motion without any warmth swelling or effusion.  There was no tenderness over ankles or MTPs.  CDAI Exam: CDAI Score: -- Patient Global: --; Provider Global: -- Swollen: --; Tender: -- Joint Exam 08/29/2022   No joint exam has been documented for this visit   There is currently no information documented on the homunculus. Go to the Rheumatology activity and complete the homunculus joint exam.  Investigation: No additional findings.  Imaging: No results found.  Recent Labs: Lab Results  Component Value Date   WBC 7.2 05/08/2022   HGB 14.7 05/08/2022   PLT 276 05/08/2022   NA 141 05/08/2022   K 3.9 05/08/2022   CL 104 05/08/2022   CO2 31 05/08/2022   GLUCOSE 102 (H) 05/08/2022   BUN 14 05/08/2022   CREATININE 1.23 (H) 05/08/2022   BILITOT 1.5 (H) 05/08/2022    ALKPHOS 61 05/08/2022   AST 25 05/08/2022   ALT 24 05/08/2022   PROT 7.0 05/08/2022   ALBUMIN 4.2 05/08/2022   CALCIUM 10.5 (H) 05/08/2022   GFRAA 55 (L) 10/21/2019    Speciality Comments: Fosamax 04/22  Procedures:  No procedures performed Allergies: Adhesive [tape]   Assessment / Plan:     Visit Diagnoses: Age-related osteoporosis without current pathological fracture - previously treated with Boniva and Fosamax 16 years ago.  December 17, 2019 left femoral neck BMD 0.647, T score -2.9. Fosamax 70 mg po q week started 08/2020.  April 18, 2022 the BMD measured at Femur Neck Right is 0.635 g/cm2 with a T-score of -2.9.  No comparison was available.  Improvement in BMD was noted in the right and left femoral neck and AP spine.  I did show discussion with the patient.  She states she has been taking Fosamax on a regular basis without interruption.  At this point she would like to continue Fosamax.  Will repeat BMD in 2025.  At that time we will take her off Fosamax.  History of vitamin D deficiency -she takes vitamin D 2000 units daily.  Plan: VITAMIN D 25 Hydroxy (Vit-D Deficiency, Fractures)  Medication monitoring encounter -Labs from May 08, 2022 showed elevated creatinine 1.23 calcium elevated at 10.5.  I will check labs today.  He has general labs we can advise her on calcium supplement usage.  Plan: COMPLETE METABOLIC PANEL WITH GFR  Primary osteoarthritis of both hands-she had bilateral PIP and DIP thickening.  No synovitis was noted.  Joint protection was discussed.  Trigger finger, right middle finger-she had thickening of flexor tendon.  She states his triggers intermittently.  Will observe for now.  Primary osteoarthritis of both knees - moderate OA and moderate chondromalacia patella.  She continues to have some discomfort in her knee joints.  Lower extremity muscle strength exercises were discussed.  A handout was placed in the 80s.  Trochanteric bursitis of both  hips-she gives history of intermittent pain.  DDD (degenerative disc disease), lumbar-she has chronic lower back pain.  Core strengthening exercises were discussed and a handout was placed.  Other medical problems  are listed as follows:  Adenocarcinoma of right lung, stage 2 - diagnosed with adenocarcinoma stage 2.  She had a partial lobectomy of the right lung and s/p CTX.  S/P partial lobectomy of lung  Dyslipidemia  History of cardiac murmur  History of gastroesophageal reflux (GERD)  History of cellulitis  History of shingles  Orders: Orders Placed This Encounter  Procedures   COMPLETE METABOLIC PANEL WITH GFR   VITAMIN D 25 Hydroxy (Vit-D Deficiency, Fractures)   Meds ordered this encounter  Medications   diclofenac Sodium (VOLTAREN) 1 % GEL    Sig: Apply 2-4 grams to affected joint 4 times daily as needed.    Dispense:  400 g    Refill:  2    Follow-Up Instructions: Return in about 6 months (around 02/28/2023) for Osteoporosis, Osteoarthritis.   Pollyann Savoy, MD  Note - This record has been created using Animal nutritionist.  Chart creation errors have been sought, but may not always  have been located. Such creation errors do not reflect on  the standard of medical care.

## 2022-08-29 ENCOUNTER — Ambulatory Visit: Payer: Medicare (Managed Care) | Attending: Rheumatology | Admitting: Rheumatology

## 2022-08-29 ENCOUNTER — Encounter: Payer: Self-pay | Admitting: Rheumatology

## 2022-08-29 VITALS — BP 146/88 | HR 73 | Resp 16 | Ht 68.0 in | Wt 235.0 lb

## 2022-08-29 DIAGNOSIS — M810 Age-related osteoporosis without current pathological fracture: Secondary | ICD-10-CM

## 2022-08-29 DIAGNOSIS — M19042 Primary osteoarthritis, left hand: Secondary | ICD-10-CM

## 2022-08-29 DIAGNOSIS — M19041 Primary osteoarthritis, right hand: Secondary | ICD-10-CM | POA: Diagnosis not present

## 2022-08-29 DIAGNOSIS — E785 Hyperlipidemia, unspecified: Secondary | ICD-10-CM

## 2022-08-29 DIAGNOSIS — M5136 Other intervertebral disc degeneration, lumbar region: Secondary | ICD-10-CM | POA: Diagnosis not present

## 2022-08-29 DIAGNOSIS — Z8719 Personal history of other diseases of the digestive system: Secondary | ICD-10-CM

## 2022-08-29 DIAGNOSIS — Z8679 Personal history of other diseases of the circulatory system: Secondary | ICD-10-CM

## 2022-08-29 DIAGNOSIS — M51369 Other intervertebral disc degeneration, lumbar region without mention of lumbar back pain or lower extremity pain: Secondary | ICD-10-CM

## 2022-08-29 DIAGNOSIS — M7061 Trochanteric bursitis, right hip: Secondary | ICD-10-CM | POA: Diagnosis not present

## 2022-08-29 DIAGNOSIS — Z902 Acquired absence of lung [part of]: Secondary | ICD-10-CM | POA: Diagnosis not present

## 2022-08-29 DIAGNOSIS — C3491 Malignant neoplasm of unspecified part of right bronchus or lung: Secondary | ICD-10-CM | POA: Diagnosis not present

## 2022-08-29 DIAGNOSIS — Z8639 Personal history of other endocrine, nutritional and metabolic disease: Secondary | ICD-10-CM

## 2022-08-29 DIAGNOSIS — Z872 Personal history of diseases of the skin and subcutaneous tissue: Secondary | ICD-10-CM

## 2022-08-29 DIAGNOSIS — Z5181 Encounter for therapeutic drug level monitoring: Secondary | ICD-10-CM

## 2022-08-29 DIAGNOSIS — Z8619 Personal history of other infectious and parasitic diseases: Secondary | ICD-10-CM

## 2022-08-29 DIAGNOSIS — M170 Bilateral primary osteoarthritis of knee: Secondary | ICD-10-CM | POA: Diagnosis not present

## 2022-08-29 DIAGNOSIS — M65331 Trigger finger, right middle finger: Secondary | ICD-10-CM

## 2022-08-29 DIAGNOSIS — M7062 Trochanteric bursitis, left hip: Secondary | ICD-10-CM

## 2022-08-29 MED ORDER — DICLOFENAC SODIUM 1 % EX GEL: CUTANEOUS | 2 refills | Status: AC

## 2022-08-29 NOTE — Patient Instructions (Signed)
Exercises for Chronic Knee Pain Chronic knee pain is pain that lasts longer than 3 months. For most people with chronic knee pain, exercise and weight loss is an important part of treatment. Your health care provider may want you to focus on: Strengthening the muscles that support your knee. This can take pressure off your knee and lessen pain. Preventing knee stiffness. Maintaining or increasing how far you can move your knee. Losing weight (if this applies) to take pressure off your knee, decrease your risk for injury, and make it easier for you to exercise. Your health care provider will help you develop an exercise program that matches your needs and physical abilities. Below are simple, low-impact exercises you can do at home. Ask your health care provider or a physical therapist how often you should do your exercise program and how many times to repeat each exercise. General safety tips Follow these safety tips for exercising with chronic knee pain: Get your health care provider's approval before doing any exercises. Start slowly and stop any time an exercise causes pain. Do not exercise if your knee pain is flaring up. Warm up first. Stretching a cold muscle can cause an injury. Do 5-10 minutes of easy movement or light stretching before beginning your exercise routine. Do 5-10 minutes of low-impact activity (like walking or cycling) before starting strengthening exercises. Contact your health care provider any time you have pain during or after exercising. Exercise may cause discomfort but should not be painful. It is normal to be a little stiff or sore after exercising.  Stretching and range-of-motion exercises Front thigh stretch  Stand up straight and support your body by holding on to a chair or resting one hand on a wall. With your legs straight and close together, bend one knee to lift your heel up toward your buttocks. Using one hand for support, grab your ankle with your free  hand. Pull your foot up closer toward your buttocks to feel the stretch in front of your thigh. Hold the stretch for 30 seconds. Repeat __________ times. Complete this exercise __________ times a day. Back thigh stretch  Sit on the floor with your back straight and your legs out straight in front of you. Place the palms of your hands on the floor and slide them toward your feet as you bend at the hip. Try to touch your nose to your knees and feel the stretch in the back of your thighs. Hold for 30 seconds. Repeat __________ times. Complete this exercise __________ times a day. Calf stretch  Stand facing a wall. Place the palms of your hands flat against the wall, arms extended, and lean slightly against the wall. Get into a lunge position with one leg bent at the knee and the other leg stretched out straight behind you. Keep both feet facing the wall and increase the bend in your knee while keeping the heel of the other leg flat on the ground. You should feel the stretch in your calf. Hold for 30 seconds. Repeat __________ times. Complete this exercise __________ times a day. Strengthening exercises Straight leg lift Lie on your back with one knee bent and the other leg out straight. Slowly lift the straight leg without bending the knee. Lift until your foot is about 12 inches (30 cm) off the floor. Hold for 3-5 seconds and slowly lower your leg. Repeat __________ times. Complete this exercise __________ times a day. Single leg dip Stand between two chairs and put both hands on the   backs of the chairs for support. Extend one leg out straight with your body weight resting on the heel of the standing leg. Slowly bend your standing knee to dip your body to the level that is comfortable for you. Hold for 3-5 seconds. Repeat __________ times. Complete this exercise __________ times a day. Hamstring curls Stand straight, knees close together, facing the back of a chair. Hold on to the  back of a chair with both hands. Keep one leg straight. Bend the other knee while bringing the heel up toward the buttock until the knee is bent at a 90-degree angle (right angle). Hold for 3-5 seconds. Repeat __________ times. Complete this exercise __________ times a day. Wall squat Stand straight with your back, hips, and head against a wall. Step forward one foot at a time with your back still against the wall. Your feet should be 2 feet (61 cm) from the wall at shoulder width. Keeping your back, hips, and head against the wall, slide down the wall to as close of a sitting position as you can get. Hold for 5-10 seconds, then slowly slide back up. Repeat __________ times. Complete this exercise __________ times a day. Step-ups Step up with one foot onto a sturdy platform or stool that is about 6 inches (15 cm) high. Face sideways with one foot on the platform and one on the ground. Place all your weight on the platform foot and lift your body off the ground until your knee extends. Let your other leg hang free to the side. Hold for 3-5 seconds then slowly lower your weight down to the floor foot. Repeat __________ times. Complete this exercise __________ times a day. Contact a health care provider if: Your exercise causes pain. Your pain is worse after you exercise. Your pain prevents you from doing your exercises. This information is not intended to replace advice given to you by your health care provider. Make sure you discuss any questions you have with your health care provider. Document Revised: 09/11/2019 Document Reviewed: 05/05/2019 Elsevier Patient Education  2023 Elsevier Inc. Back Exercises The following exercises strengthen the muscles that help to support the trunk (torso) and back. They also help to keep the lower back flexible. Doing these exercises can help to prevent or lessen existing low back pain. If you have back pain or discomfort, try doing these exercises 2-3  times each day or as told by your health care provider. As your pain improves, do them once each day, but increase the number of times that you repeat the steps for each exercise (do more repetitions). To prevent the recurrence of back pain, continue to do these exercises once each day or as told by your health care provider. Do exercises exactly as told by your health care provider and adjust them as directed. It is normal to feel mild stretching, pulling, tightness, or discomfort as you do these exercises, but you should stop right away if you feel sudden pain or your pain gets worse. Exercises Single knee to chest Repeat these steps 3-5 times for each leg: Lie on your back on a firm bed or the floor with your legs extended. Bring one knee to your chest. Your other leg should stay extended and in contact with the floor. Hold your knee in place by grabbing your knee or thigh with both hands and hold. Pull on your knee until you feel a gentle stretch in your lower back or buttocks. Hold the stretch for 10-30 seconds. Slowly   release and straighten your leg.  Pelvic tilt Repeat these steps 5-10 times: Lie on your back on a firm bed or the floor with your legs extended. Bend your knees so they are pointing toward the ceiling and your feet are flat on the floor. Tighten your lower abdominal muscles to press your lower back against the floor. This motion will tilt your pelvis so your tailbone points up toward the ceiling instead of pointing to your feet or the floor. With gentle tension and even breathing, hold this position for 5-10 seconds.  Cat-cow Repeat these steps until your lower back becomes more flexible: Get into a hands-and-knees position on a firm bed or the floor. Keep your hands under your shoulders, and keep your knees under your hips. You may place padding under your knees for comfort. Let your head hang down toward your chest. Contract your abdominal muscles and point your  tailbone toward the floor so your lower back becomes rounded like the back of a cat. Hold this position for 5 seconds. Slowly lift your head, let your abdominal muscles relax, and point your tailbone up toward the ceiling so your back forms a sagging arch like the back of a cow. Hold this position for 5 seconds.  Press-ups Repeat these steps 5-10 times: Lie on your abdomen (face-down) on a firm bed or the floor. Place your palms near your head, about shoulder-width apart. Keeping your back as relaxed as possible and keeping your hips on the floor, slowly straighten your arms to raise the top half of your body and lift your shoulders. Do not use your back muscles to raise your upper torso. You may adjust the placement of your hands to make yourself more comfortable. Hold this position for 5 seconds while you keep your back relaxed. Slowly return to lying flat on the floor.  Bridges Repeat these steps 10 times: Lie on your back on a firm bed or the floor. Bend your knees so they are pointing toward the ceiling and your feet are flat on the floor. Your arms should be flat at your sides, next to your body. Tighten your buttocks muscles and lift your buttocks off the floor until your waist is at almost the same height as your knees. You should feel the muscles working in your buttocks and the back of your thighs. If you do not feel these muscles, slide your feet 1-2 inches (2.5-5 cm) farther away from your buttocks. Hold this position for 3-5 seconds. Slowly lower your hips to the starting position, and allow your buttocks muscles to relax completely. If this exercise is too easy, try doing it with your arms crossed over your chest. Abdominal crunches Repeat these steps 5-10 times: Lie on your back on a firm bed or the floor with your legs extended. Bend your knees so they are pointing toward the ceiling and your feet are flat on the floor. Cross your arms over your chest. Tip your chin slightly  toward your chest without bending your neck. Tighten your abdominal muscles and slowly raise your torso high enough to lift your shoulder blades a tiny bit off the floor. Avoid raising your torso higher than that because it can put too much stress on your lower back and does not help to strengthen your abdominal muscles. Slowly return to your starting position.  Back lifts Repeat these steps 5-10 times: Lie on your abdomen (face-down) with your arms at your sides, and rest your forehead on the floor. Tighten the   muscles in your legs and your buttocks. Slowly lift your chest off the floor while you keep your hips pressed to the floor. Keep the back of your head in line with the curve in your back. Your eyes should be looking at the floor. Hold this position for 3-5 seconds. Slowly return to your starting position.  Contact a health care provider if: Your back pain or discomfort gets much worse when you do an exercise. Your worsening back pain or discomfort does not lessen within 2 hours after you exercise. If you have any of these problems, stop doing these exercises right away. Do not do them again unless your health care provider says that you can. Get help right away if: You develop sudden, severe back pain. If this happens, stop doing the exercises right away. Do not do them again unless your health care provider says that you can. This information is not intended to replace advice given to you by your health care provider. Make sure you discuss any questions you have with your health care provider. Document Revised: 11/02/2020 Document Reviewed: 07/21/2020 Elsevier Patient Education  2023 Elsevier Inc.  

## 2022-08-30 LAB — COMPLETE METABOLIC PANEL WITH GFR
AG Ratio: 1.5 (calc) (ref 1.0–2.5)
ALT: 20 U/L (ref 6–29)
AST: 23 U/L (ref 10–35)
Albumin: 4.1 g/dL (ref 3.6–5.1)
Alkaline phosphatase (APISO): 58 U/L (ref 37–153)
BUN: 14 mg/dL (ref 7–25)
CO2: 26 mmol/L (ref 20–32)
Calcium: 10 mg/dL (ref 8.6–10.4)
Chloride: 103 mmol/L (ref 98–110)
Creat: 0.98 mg/dL (ref 0.60–1.00)
Globulin: 2.7 g/dL (calc) (ref 1.9–3.7)
Glucose, Bld: 89 mg/dL (ref 65–99)
Potassium: 4.1 mmol/L (ref 3.5–5.3)
Sodium: 141 mmol/L (ref 135–146)
Total Bilirubin: 1.1 mg/dL (ref 0.2–1.2)
Total Protein: 6.8 g/dL (ref 6.1–8.1)
eGFR: 60 mL/min/{1.73_m2} (ref >=60)

## 2022-08-30 LAB — VITAMIN D 25 HYDROXY (VIT D DEFICIENCY, FRACTURES): Vit D, 25-Hydroxy: 69 ng/mL (ref 30–100)

## 2022-08-30 NOTE — Progress Notes (Signed)
CMP is normal.  Vitamin D is normal at 69.  No change in treatment advised.

## 2022-09-08 ENCOUNTER — Encounter: Payer: Self-pay | Admitting: Gastroenterology

## 2022-09-15 ENCOUNTER — Telehealth: Payer: Self-pay | Admitting: Gastroenterology

## 2022-09-15 NOTE — Telephone Encounter (Signed)
Inbound call from patient wanting to schedule for procedure. Please advise if the patient needs an office visit or If she can be scheduled direct.  Thank you

## 2022-09-15 NOTE — Telephone Encounter (Signed)
Okay to book direct at the [facility] for colonoscopy if she meets criteria - she had numerous polyps on her last exam and is due. Thanks

## 2022-09-20 ENCOUNTER — Encounter: Payer: Self-pay | Admitting: Gastroenterology

## 2022-10-27 ENCOUNTER — Ambulatory Visit (AMBULATORY_SURGERY_CENTER): Payer: Medicare (Managed Care)

## 2022-10-27 VITALS — Ht 68.0 in | Wt 230.0 lb

## 2022-10-27 DIAGNOSIS — Z86010 Personal history of colonic polyps: Secondary | ICD-10-CM

## 2022-10-27 MED ORDER — NA SULFATE-K SULFATE-MG SULF 17.5-3.13-1.6 GM/177ML PO SOLN: 1.0000 | Freq: Once | ORAL | 0 refills | Status: AC

## 2022-10-27 NOTE — Progress Notes (Signed)
No egg or soy allergy known to patient  No issues known to pt with past sedation with any surgeries or procedures Patient denies ever being told they had issues or difficulty with intubation  No FH of Malignant Hyperthermia Pt is not on diet pills Pt is not on  home 02  Pt is not on blood thinners  Pt reports occ episodes of constipation instructions given for extra Miralax  No A fib or A flutter Have any cardiac testing pending--no  Pt is ambulatory   PV completed. Prep instructions reviewed and sent to pt via mychart / home address. Pt understand to read all highlighted areas in the packet on arrival and to call the office back with any questions.  Good rx coupon for [facility] provided in the event the pt has any issues with the pricing at [facility]. Pt instructed to use Singlecare.com or GoodRx for a price reduction on prep if needed,    Patient's chart reviewed by Cathlyn Parsons CNRA prior to previsit and patient appropriate for the [facility].  Previsit completed and red dot placed by patient's name on their procedure day (on provider's schedule).

## 2022-10-31 DIAGNOSIS — C3491 Malignant neoplasm of unspecified part of right bronchus or lung: Secondary | ICD-10-CM | POA: Diagnosis not present

## 2022-10-31 DIAGNOSIS — E785 Hyperlipidemia, unspecified: Secondary | ICD-10-CM | POA: Diagnosis not present

## 2022-10-31 DIAGNOSIS — E559 Vitamin D deficiency, unspecified: Secondary | ICD-10-CM | POA: Diagnosis not present

## 2022-10-31 DIAGNOSIS — J302 Other seasonal allergic rhinitis: Secondary | ICD-10-CM | POA: Diagnosis not present

## 2022-10-31 DIAGNOSIS — K5730 Diverticulosis of large intestine without perforation or abscess without bleeding: Secondary | ICD-10-CM | POA: Diagnosis not present

## 2022-10-31 DIAGNOSIS — R232 Flushing: Secondary | ICD-10-CM | POA: Diagnosis not present

## 2022-10-31 DIAGNOSIS — Z23 Encounter for immunization: Secondary | ICD-10-CM | POA: Diagnosis not present

## 2022-10-31 DIAGNOSIS — Z90710 Acquired absence of both cervix and uterus: Secondary | ICD-10-CM | POA: Diagnosis not present

## 2022-10-31 DIAGNOSIS — M810 Age-related osteoporosis without current pathological fracture: Secondary | ICD-10-CM | POA: Diagnosis not present

## 2022-10-31 DIAGNOSIS — Z7189 Other specified counseling: Secondary | ICD-10-CM | POA: Diagnosis not present

## 2022-10-31 DIAGNOSIS — Z1331 Encounter for screening for depression: Secondary | ICD-10-CM | POA: Diagnosis not present

## 2022-10-31 DIAGNOSIS — Z0000 Encounter for general adult medical examination without abnormal findings: Secondary | ICD-10-CM | POA: Diagnosis not present

## 2022-10-31 DIAGNOSIS — N200 Calculus of kidney: Secondary | ICD-10-CM | POA: Diagnosis not present

## 2022-11-02 ENCOUNTER — Encounter: Payer: Self-pay | Admitting: Gastroenterology

## 2022-11-17 ENCOUNTER — Encounter: Payer: Self-pay | Admitting: Gastroenterology

## 2022-11-17 ENCOUNTER — Ambulatory Visit (AMBULATORY_SURGERY_CENTER): Payer: Medicare (Managed Care) | Admitting: Gastroenterology

## 2022-11-17 VITALS — BP 138/83 | HR 73 | Temp 96.6°F | Resp 14 | Ht 68.0 in | Wt 230.0 lb

## 2022-11-17 DIAGNOSIS — D123 Benign neoplasm of transverse colon: Secondary | ICD-10-CM | POA: Diagnosis not present

## 2022-11-17 DIAGNOSIS — K629 Disease of anus and rectum, unspecified: Secondary | ICD-10-CM | POA: Diagnosis not present

## 2022-11-17 DIAGNOSIS — D120 Benign neoplasm of cecum: Secondary | ICD-10-CM

## 2022-11-17 DIAGNOSIS — K621 Rectal polyp: Secondary | ICD-10-CM | POA: Diagnosis not present

## 2022-11-17 DIAGNOSIS — Z86010 Personal history of colonic polyps: Secondary | ICD-10-CM | POA: Diagnosis not present

## 2022-11-17 DIAGNOSIS — Z09 Encounter for follow-up examination after completed treatment for conditions other than malignant neoplasm: Secondary | ICD-10-CM | POA: Diagnosis not present

## 2022-11-17 DIAGNOSIS — K635 Polyp of colon: Secondary | ICD-10-CM | POA: Diagnosis not present

## 2022-11-17 MED ORDER — SODIUM CHLORIDE 0.9 % IV SOLN: 500.0000 mL | Freq: Once | INTRAVENOUS | Status: DC

## 2022-11-17 NOTE — Progress Notes (Signed)
Sedate, gd SR, tolerated procedure well, VSS, report to RN 

## 2022-11-17 NOTE — Patient Instructions (Signed)
Discharge instructions given. Handouts on polyps,diverticulosis and hemorrhoids. Resume previous medications. YOU HAD AN ENDOSCOPIC PROCEDURE TODAY AT THE [facility]:   Refer to the procedure report that was given to you for any specific questions about what was found during the examination.  If the procedure report does not answer your questions, please call your gastroenterologist to clarify.  If you requested that your care partner not be given the details of your procedure findings, then the procedure report has been included in a sealed envelope for you to review at your convenience later.  YOU SHOULD EXPECT: Some feelings of bloating in the abdomen. Passage of more gas than usual.  Walking can help get rid of the air that was put into your GI tract during the procedure and reduce the bloating. If you had a lower endoscopy (such as a colonoscopy or flexible sigmoidoscopy) you may notice spotting of blood in your stool or on the toilet paper. If you underwent a bowel prep for your procedure, you may not have a normal bowel movement for a few days.  Please Note:  You might notice some irritation and congestion in your nose or some drainage.  This is from the oxygen used during your procedure.  There is no need for concern and it should clear up in a day or so.  SYMPTOMS TO REPORT IMMEDIATELY:  Following lower endoscopy (colonoscopy or flexible sigmoidoscopy):  Excessive amounts of blood in the stool  Significant tenderness or worsening of abdominal pains  Swelling of the abdomen that is new, acute  Fever of 100F or higher   For urgent or emergent issues, a gastroenterologist can be reached at any hour by calling [phone]. Do not use MyChart messaging for urgent concerns.    DIET:  We do recommend a small meal at first, but then you may proceed to your regular diet.  Drink plenty of fluids but you should avoid alcoholic beverages for 24 hours.  ACTIVITY:  You should  plan to take it easy for the rest of today and you should NOT DRIVE or use heavy machinery until tomorrow (because of the sedation medicines used during the test).    FOLLOW UP: Our staff will call the number listed on your records the next business day following your procedure.  We will call around 7:15- 8:00 am to check on you and address any questions or concerns that you may have regarding the information given to you following your procedure. If we do not reach you, we will leave a message.     If any biopsies were taken you will be contacted by phone or by letter within the next 1-3 weeks.  Please call us at [phone] if you have not heard about the biopsies in 3 weeks.    SIGNATURES/CONFIDENTIALITY: You and/or your care partner have signed paperwork which will be entered into your electronic medical record.  These signatures attest to the fact that that the information above on your After Visit Summary has been reviewed and is understood.  Full responsibility of the confidentiality of this discharge information lies with you and/or your care-partner. 

## 2022-11-17 NOTE — Progress Notes (Signed)
Called to room to assist during endoscopic procedure.  Patient ID and intended procedure confirmed with present staff. Received instructions for my participation in the procedure from the performing physician.  

## 2022-11-17 NOTE — Progress Notes (Signed)
[facility] History and Physical   Primary Care Physician:  Lorenda Ishihara, MD   Reason for Procedure:   History of colon polyps  Plan:    colonoscopy     HPI: Stephanie Crawford is a 77 y.o. female  here for colonoscopy surveillance -10 polyps removed on her last exam 08/2019, due for surveillance.   . Patient denies any bowel symptoms at this time. No family history of colon cancer known. Otherwise feels well without any cardiopulmonary symptoms.   I have discussed risks / benefits of anesthesia and endoscopic procedure with [name] and they wish to proceed with the exams as outlined today.    Past Medical History:  Diagnosis Date   Allergic rhinitis    Allergy    Anemia    prior to hysterectomy--1997   Arthritis    GERD (gastroesophageal reflux disease)    Heart murmur    Hemorrhoid 2011   History of shingles 04/2014   Kidney stones    Lung cancer (HCC)    Nodule of lower lobe of right lung    Osteoporosis    Varicose veins of left lower extremity     Past Surgical History:  Procedure Laterality Date   ABDOMINAL HYSTERECTOMY  1997   APPENDECTOMY  1960   BREAST EXCISIONAL BIOPSY Left    decades ago- "something was removed"   HEMORRHOID SURGERY  01/2010   8/27 had a follow up procedure.     IR THORACENTESIS ASP PLEURAL SPACE W/IMG GUIDE  10/24/2018   LIPOMA EXCISION  1975   neck, left breast and righ jaw.   VIDEO ASSISTED THORACOSCOPY (VATS)/WEDGE RESECTION Right 10/09/2018   Procedure: VIDEO ASSISTED THORACOSCOPY (VATS)/WEDGE RESECTION;  Surgeon: Loreli Slot, MD;  Location: [facility] OR;  Service: Thoracic;  Laterality: Right;   WRIST FRACTURE SURGERY      Prior to Admission medications   Medication Sig Start Date End Date Taking? Authorizing Provider  alendronate (FOSAMAX) 70 MG tablet Take by mouth once a week. 01/26/20  Yes [provider]  aspirin 325 MG EC tablet Take 650 mg by mouth daily as needed for pain.    Yes [provider]  atorvastatin (LIPITOR) 10 MG tablet Take 1 tablet by mouth daily. 11/03/20  Yes [provider]  Biotin 5000 MCG CAPS Take 1 capsule by mouth daily.   Yes [provider]  calcium carbonate (OS-CAL) 600 MG TABS tablet Take 600 mg by mouth in the morning and at bedtime.   Yes [provider]  Cholecalciferol (VITAMIN D) 50 MCG (2000 UT) tablet Take 2,000 Units by mouth at bedtime.   Yes [provider]  diclofenac Sodium (VOLTAREN) 1 % GEL Apply 2-4 grams to affected joint 4 times daily as needed. 08/29/22  Yes Deveshwar, Janalyn Rouse, MD  VITAMIN A PO Take 2,400 mcg by mouth daily.   Yes [provider]  azelastine (OPTIVAR) 0.05 % ophthalmic solution Place 1 drop into both eyes daily as needed (irritation).    [provider]  COVID-19 mRNA vaccine [id number] (COMIRNATY) syringe Inject into the muscle. Patient not taking: Reported on 08/29/2022 04/20/22   Judyann Munson, MD  Multiple Vitamin (MULTIVITAMIN PO) Take by mouth daily. Patient not taking: Reported on 10/27/2022    [provider]    Current Outpatient Medications  Medication Sig Dispense Refill   alendronate (FOSAMAX) 70 MG tablet Take by mouth once a week.     aspirin 325 MG EC tablet Take 650 mg  by mouth daily as needed for pain.     atorvastatin (LIPITOR) 10 MG tablet Take 1 tablet by mouth daily.     Biotin 5000 MCG CAPS Take 1 capsule by mouth daily.     calcium carbonate (OS-CAL) 600 MG TABS tablet Take 600 mg by mouth in the morning and at bedtime.     Cholecalciferol (VITAMIN D) 50 MCG (2000 UT) tablet Take 2,000 Units by mouth at bedtime.     diclofenac Sodium (VOLTAREN) 1 % GEL Apply 2-4 grams to affected joint 4 times daily as needed. 400 g 2   VITAMIN A PO Take 2,400 mcg by mouth daily.     azelastine (OPTIVAR) 0.05 % ophthalmic solution Place 1 drop into both eyes daily as needed (irritation).     COVID-19 mRNA vaccine 2023-2024  (COMIRNATY) syringe Inject into the muscle. (Patient not taking: Reported on 08/29/2022) 0.3 mL 0   Multiple Vitamin (MULTIVITAMIN PO) Take by mouth daily. (Patient not taking: Reported on 10/27/2022)     Current Facility-Administered Medications  Medication Dose Route Frequency Provider Last Rate Last Admin   0.9 %  sodium chloride infusion  500 mL Intravenous Once Dontavian Marchi, Willaim Rayas, MD        Allergies as of 11/17/2022 - Review Complete 11/17/2022  Allergen Reaction Noted   Adhesive [tape] Rash 10/01/2018    Family History  Problem Relation Age of Onset   Cancer Father    Prostate cancer Father    Asthma Sister    Heart attack Brother    Prostate cancer Brother    Lung cancer Maternal Grandmother    Asthma Daughter    Colon cancer Neg Hx    Esophageal cancer Neg Hx    Pancreatic cancer Neg Hx    Stomach cancer Neg Hx    Liver disease Neg Hx    Rectal cancer Neg Hx    Breast cancer Neg Hx     Social History   Socioeconomic History   Marital status: Married    Spouse name: Not on file   Number of children: Not on file   Years of education: Not on file   Highest education level: Not on file  Occupational History   Not on file  Tobacco Use   Smoking status: Never    Passive exposure: Never   Smokeless tobacco: Never  Vaping Use   Vaping Use: Never used  Substance and Sexual Activity   Alcohol use: No    Alcohol/week: 0.0 standard drinks of alcohol   Drug use: No   Sexual activity: Yes  Other Topics Concern   Not on file  Social History Narrative   [facility] (11/30/16):   Originally from [place], [place]. Grew up primarily in [place], [place]. She has also lived in [place]. She moved to [facility] in 2006. 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 Recently has acquired an outside dog. No bird exposure. Possible mold problem in her current home with  history of water damage. No hot tub exposure. Enjoys babysitting her grandsons. Remote travel to [place], [place], [place], [place], [place], [place], [place], [place], [place], & [place]    Social Determinants of Health   Financial Resource Strain: Not on file  Food Insecurity: Not on file  Transportation Needs: Not on file  Physical Activity: Not on file  Stress: Not on file  Social Connections: Not on file  Intimate Partner Violence: Not on file    Review of Systems: All other review of systems negative except as mentioned in the HPI.  Physical Exam: Vital signs BP 127/71   Pulse 81   Temp (!) 96.6 F (35.9 C)   Ht 5\' 8"  (1.727 m)   Wt 230 lb (104.3 kg)   LMP 05/23/1995   SpO2 98%   BMI 34.97 kg/m   General:   Alert,  Well-developed, pleasant and cooperative in NAD Lungs:  Clear throughout to auscultation.   Heart:  Regular rate and rhythm Abdomen:  Soft, nontender and nondistended.   Neuro/Psych:  Alert and cooperative. Normal mood and affect. A and O x 3  Harlin Rain, MD [facility]

## 2022-11-17 NOTE — Op Note (Signed)
[facility] Patient Name: Stephanie Crawford Procedure Date: 11/17/2022 1:03 PM MRN: [id number] Endoscopist: Viviann Spare P. Adela Lank , MD, [id number] Age: 77 Referring MD:  Date of Birth: May 27, 1945 Gender: Female Account #: [id number] Procedure:                Colonoscopy Indications:              High risk colon cancer surveillance: Personal                            history of colonic polyps - 10 polyps removed 08/2019 Medicines:                Monitored Anesthesia Care Procedure:                Pre-Anesthesia Assessment:                           - Prior to the procedure, a History and Physical                            was performed, and patient medications and                            allergies were reviewed. The patient's tolerance of                            previous anesthesia was also reviewed. The risks                            and benefits of the procedure and the sedation                            options and risks were discussed with the patient.                            All questions were answered, and informed consent                            was obtained. Prior Anticoagulants: The patient has                            taken no anticoagulant or antiplatelet agents. ASA                            Grade Assessment: II - A patient with mild systemic                            disease. After reviewing the risks and benefits,                            the patient was deemed in satisfactory condition to                            undergo the procedure.  After obtaining informed consent, the colonoscope                            was passed under direct vision. Throughout the                            procedure, the patient's blood pressure, pulse, and                            oxygen saturations were monitored continuously. The                            PCF-HQ190L Colonoscope [id number] was introduced                             through the anus and advanced to the the cecum,                            identified by appendiceal orifice and ileocecal                            valve. The colonoscopy was performed without                            difficulty. The patient tolerated the procedure                            well. The quality of the bowel preparation was                            good. The ileocecal valve, appendiceal orifice, and                            rectum were photographed. Scope In: 1:24:01 PM Scope Out: 1:45:00 PM Scope Withdrawal Time: 0 hours 16 minutes 38 seconds  Total Procedure Duration: 0 hours 20 minutes 59 seconds  Findings:                 The perianal and digital rectal examinations were                            normal.                           A 4 mm polyp was found in the cecum. The polyp was                            sessile. The polyp was removed with a cold snare.                            Resection and retrieval were complete.                           A 3 mm polyp was found in the transverse colon. The  polyp was sessile. The polyp was removed with a                            cold snare. Resection and retrieval were complete.                           Multiple small-mouthed diverticula were found in                            the entire colon.                           An area of altered mucosa was found in the distal                            rectum at the dentate line - suspect inflammatory /                            reaction due to prior surgery? Biopsied last exam                            which came back benign. Some small areas of                            nodularity at the dentate line noted. Biopsies were                            taken with a cold forceps for histology to ensure                            no dysplastic changes.                           Internal hemorrhoids were found during retroflexion.                            The exam was otherwise without abnormality. Complications:            No immediate complications. Estimated blood loss:                            Minimal. Estimated Blood Loss:     Estimated blood loss was minimal. Impression:               - One 4 mm polyp in the cecum, removed with a cold                            snare. Resected and retrieved.                           - One 3 mm polyp in the transverse colon, removed                            with a cold snare. Resected and retrieved.                           -  Diverticulosis in the entire examined colon.                           - Altered mucosa in the distal rectum. Biopsied.                           - Internal hemorrhoids.                           - The examination was otherwise normal. Recommendation:           - Patient has a contact number available for                            emergencies. The signs and symptoms of potential                            delayed complications were discussed with the                            patient. Return to normal activities tomorrow.                            Written discharge instructions were provided to the                            patient.                           - Resume previous diet.                           - Continue present medications.                           - Await pathology results. Viviann Spare P. Bianey Tesoro, MD 11/17/2022 1:52:13 PM This report has been signed electronically.

## 2022-11-17 NOTE — Progress Notes (Signed)
Pt's states no medical or surgical changes since previsit or office visit. 

## 2022-11-20 ENCOUNTER — Other Ambulatory Visit: Payer: Self-pay

## 2022-11-20 ENCOUNTER — Telehealth: Payer: Self-pay | Admitting: *Deleted

## 2022-11-20 ENCOUNTER — Encounter: Payer: Medicare (Managed Care) | Admitting: Gastroenterology

## 2022-11-20 NOTE — Telephone Encounter (Signed)
  Follow up Call-     11/17/2022   12:45 PM  Call back number  Post procedure Call Back phone  # [phone]  Permission to leave phone message Yes    [facility]

## 2022-11-20 NOTE — Telephone Encounter (Signed)
Patient called requesting a new prescription for fosamax. Patient's PCP has previously been prescribing but she would like Dr. Corliss Skains to prescribe now since she is managing the osteoporosis per their conversation at patient's last office visit. Patient states since Dr. Corliss Skains is the one recommending she stop fosamax in 2025 after updated DEXA, she would like her to fill it until then. I advised patient that generally we like to bring the patient back for an appointment prior to taking over a prescription and patient states this conversation was had at her last visit on 08/29/2022. [facility]'s pharmacy is [facility] [address] in [place].  Please advise.

## 2022-11-21 ENCOUNTER — Other Ambulatory Visit: Payer: Self-pay | Admitting: Internal Medicine

## 2022-11-21 DIAGNOSIS — Z1231 Encounter for screening mammogram for malignant neoplasm of breast: Secondary | ICD-10-CM

## 2022-11-21 MED ORDER — ALENDRONATE SODIUM 70 MG PO TABS
70.0000 mg | ORAL_TABLET | ORAL | 0 refills | Status: DC
Start: 2022-11-21 — End: 2023-03-30

## 2022-11-21 NOTE — Telephone Encounter (Signed)
It is okay to send prescription for Fosamax 70 mg p.o. weekly.  Patient should take Fosamax empty stomach with 8 ounces of water every in the morning.  She should stay upright for 30 minutes after taking the dose of Fosamax.

## 2022-12-19 ENCOUNTER — Ambulatory Visit: Payer: Medicare (Managed Care)

## 2022-12-19 ENCOUNTER — Ambulatory Visit: Payer: Medicare (Managed Care) | Admitting: Emergency Medicine

## 2022-12-19 ENCOUNTER — Encounter: Payer: Self-pay | Admitting: Emergency Medicine

## 2022-12-19 VITALS — BP 122/70 | HR 78 | Temp 96.5°F | Ht 69.0 in | Wt 234.0 lb

## 2022-12-19 DIAGNOSIS — R0600 Dyspnea, unspecified: Secondary | ICD-10-CM | POA: Insufficient documentation

## 2022-12-19 DIAGNOSIS — R0609 Other forms of dyspnea: Secondary | ICD-10-CM

## 2022-12-19 DIAGNOSIS — R0602 Shortness of breath: Secondary | ICD-10-CM

## 2022-12-19 HISTORY — DX: Dyspnea, unspecified: R06.00

## 2022-12-19 NOTE — Progress Notes (Signed)
Subjective:    Patient ID: Stephanie Crawford, female    DOB: Feb 04, 1946, 77 y.o.   MRN: [id number]  HPI 77 year old man, never smoker, who has been seen in our office remotely for chronic cough, dyspnea, pulmonary nodular disease on CT scan of the chest.  I last saw her in March 2019 at which time she had stable right lower lobe subpleural consolidation, scattered smaller pulmonary nodules.  Subsequent evaluation did show interval change and she was diagnosed with stage IIb adenocarcinoma in 09/2018, underwent wedge resection and adjuvant chemotherapy.  She has been on observation and is followed by Dr. Arbutus Ped. She is here to discuss some increased SOB. She has tried to start walking more, notices some progression of exertional SOB. She pushes through, but she is dyspneic, doesn't have to stop to rest. She notices a lot of perspiration. Some L sided chest discomfort - but happens at random, not necessarily w the exertion. There may be some wheeze. She has some baseline cough in the am when she gets up.   Pulmonary function testing 02/09/2017 reviewed by me, shows principally restriction on spirometry with possible coexisting obstruction, restrictive lung volumes and a normal diffusion capacity.  CT chest 05/08/2022 reviewed by me    Review of Systems As per [facility]  Past Medical History:  Diagnosis Date   Allergic rhinitis    Allergy    Anemia    prior to hysterectomy--1997   Arthritis    Dyspnea 12/19/2022   GERD (gastroesophageal reflux disease)    Heart murmur    Hemorrhoid 2011   History of shingles 04/2014   Kidney stones    Lung cancer (HCC)    Nodule of lower lobe of right lung    Osteoporosis    Varicose veins of left lower extremity      Family History  Problem Relation Age of Onset   Cancer Father    Prostate cancer Father    Asthma Sister    Heart attack Brother    Prostate cancer Brother    Lung cancer Maternal Grandmother    Asthma Daughter    Colon cancer Neg  Hx    Esophageal cancer Neg Hx    Pancreatic cancer Neg Hx    Stomach cancer Neg Hx    Liver disease Neg Hx    Rectal cancer Neg Hx    Breast cancer Neg Hx      Social History   Socioeconomic History   Marital status: Married    Spouse name: Not on file   Number of children: Not on file   Years of education: Not on file   Highest education level: Not on file  Occupational History   Not on file  Tobacco Use   Smoking status: Never    Passive exposure: Never   Smokeless tobacco: Never  Vaping Use   Vaping status: Never Used  Substance and Sexual Activity   Alcohol use: No    Alcohol/week: 0.0 standard drinks of alcohol   Drug use: No   Sexual activity: Yes  Other Topics Concern   Not on file  Social History Narrative   [facility] (11/30/16):   Originally from [place], [place]. Grew up primarily in [place], [place]. She has also lived in [place]. She moved to [facility] in 2006. 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 Recently has acquired an outside dog. No bird exposure.  Possible mold problem in her current home with history of water damage. No hot tub exposure. Enjoys babysitting her grandsons. Remote travel to [place], [place], [place], [place], [place], [place], [place], [place], [place], & [place]    Social Determinants of Health   Financial Resource Strain: Not on file  Food Insecurity: Not on file  Transportation Needs: Not on file  Physical Activity: Not on file  Stress: Not on file  Social Connections: Not on file  Intimate Partner Violence: Not on file     Allergies  Allergen Reactions   Adhesive [Tape] Rash     Outpatient Medications Prior to Visit  Medication Sig Dispense Refill   alendronate (FOSAMAX) 70 MG tablet Take 1 tablet (70 mg total) by mouth once a week. Take with a full glass of water on an empty stomach. Stay  upright for 30 minutes after taking medication. 12 tablet 0   aspirin 325 MG EC tablet Take 650 mg by mouth daily as needed for pain.     atorvastatin (LIPITOR) 10 MG tablet Take 1 tablet by mouth daily.     azelastine (OPTIVAR) 0.05 % ophthalmic solution Place 1 drop into both eyes daily as needed (irritation).     Biotin 5000 MCG CAPS Take 1 capsule by mouth daily.     calcium carbonate (OS-CAL) 600 MG TABS tablet Take 600 mg by mouth in the morning and at bedtime.     Cholecalciferol (VITAMIN D) 50 MCG (2000 UT) tablet Take 2,000 Units by mouth at bedtime.     COVID-19 mRNA vaccine 2023-2024 (COMIRNATY) syringe Inject into the muscle. 0.3 mL 0   diclofenac Sodium (VOLTAREN) 1 % GEL Apply 2-4 grams to affected joint 4 times daily as needed. 400 g 2   Multiple Vitamin (MULTIVITAMIN PO) Take by mouth daily.     VITAMIN A PO Take 2,400 mcg by mouth daily.     alendronate (FOSAMAX) 70 MG tablet Take by mouth once a week. (Patient not taking: Reported on 12/19/2022)     No facility-administered medications prior to visit.         Objective:   Physical Exam Vitals:   12/19/22 1105  BP: 122/70  Pulse: 78  Temp: (!) 96.5 F (35.8 C)  TempSrc: Temporal  SpO2: 97%  Weight: 234 lb (106.1 kg)  Height: 5\' 9"  (1.753 m)   Gen: Pleasant, well-nourished, in no distress,  normal affect  ENT: No lesions,  mouth clear,  oropharynx clear, no postnasal drip  Neck: No JVD, no stridor  Lungs: No use of accessory muscles, no crackles or wheezing on normal respiration, no wheeze on forced expiration  Cardiovascular: RRR, soft end systolic and early diastolic murmur  Musculoskeletal: No deformities, no cyanosis or clubbing  Neuro: alert, awake, non focal  Skin: Warm, no lesions or rash      Assessment & Plan:  Dyspnea Shortness of breath principally on exertion, may be sometimes associated with wheeze heard by her husband.  Associated with perspiration.  Question whether some of this may be  deconditioning but need to rule out any new parenchymal or pleural abnormality, pleural effusion.  Also reassess pulmonary function testing to look for restriction post lung resection, obstructive lung disease.  Plan to follow-up with her after testing so we can review those results together.   Levy Pupa, MD, PhD 12/19/2022, 11:41 AM [facility] [phone] or if no answer before 7:00PM call [phone] For any issues after 7:00PM please call eLink [phone]

## 2022-12-19 NOTE — Assessment & Plan Note (Signed)
Shortness of breath principally on exertion, may be sometimes associated with wheeze heard by her husband.  Associated with perspiration.  Question whether some of this may be deconditioning but need to rule out any new parenchymal or pleural abnormality, pleural effusion.  Also reassess pulmonary function testing to look for restriction post lung resection, obstructive lung disease.  Plan to follow-up with her after testing so we can review those results together.

## 2022-12-19 NOTE — Patient Instructions (Signed)
We will perform a chest x-ray today We will arrange for pulmonary function testing to compare with your priors. Continue to follow with Dr. Arbutus Ped and have your chest imaging as per his plans Follow Dr. Delton Coombes next available after your testing so we can review those results together.

## 2023-01-05 ENCOUNTER — Institutional Professional Consult (permissible substitution): Payer: Medicare (Managed Care) | Admitting: Emergency Medicine

## 2023-01-08 ENCOUNTER — Ambulatory Visit: Payer: Medicare (Managed Care)

## 2023-01-09 ENCOUNTER — Ambulatory Visit
Admission: RE | Admit: 2023-01-09 | Discharge: 2023-01-09 | Disposition: A | Discharge status: Home / Self Care | Payer: Medicare (Managed Care) | Source: Ambulatory Visit | Attending: Internal Medicine | Admitting: Internal Medicine

## 2023-01-09 DIAGNOSIS — Z1231 Encounter for screening mammogram for malignant neoplasm of breast: Secondary | ICD-10-CM | POA: Diagnosis not present

## 2023-01-16 DIAGNOSIS — J342 Deviated nasal septum: Secondary | ICD-10-CM | POA: Diagnosis not present

## 2023-01-16 DIAGNOSIS — R0982 Postnasal drip: Secondary | ICD-10-CM | POA: Diagnosis not present

## 2023-01-16 DIAGNOSIS — J310 Chronic rhinitis: Secondary | ICD-10-CM | POA: Diagnosis not present

## 2023-01-16 DIAGNOSIS — H6123 Impacted cerumen, bilateral: Secondary | ICD-10-CM | POA: Diagnosis not present

## 2023-01-16 DIAGNOSIS — H903 Sensorineural hearing loss, bilateral: Secondary | ICD-10-CM | POA: Diagnosis not present

## 2023-01-16 DIAGNOSIS — J343 Hypertrophy of nasal turbinates: Secondary | ICD-10-CM | POA: Diagnosis not present

## 2023-01-18 DIAGNOSIS — L918 Other hypertrophic disorders of the skin: Secondary | ICD-10-CM | POA: Diagnosis not present

## 2023-01-18 DIAGNOSIS — L821 Other seborrheic keratosis: Secondary | ICD-10-CM | POA: Diagnosis not present

## 2023-01-18 DIAGNOSIS — D2262 Melanocytic nevi of left upper limb, including shoulder: Secondary | ICD-10-CM | POA: Diagnosis not present

## 2023-01-18 DIAGNOSIS — D2261 Melanocytic nevi of right upper limb, including shoulder: Secondary | ICD-10-CM | POA: Diagnosis not present

## 2023-01-18 DIAGNOSIS — D2272 Melanocytic nevi of left lower limb, including hip: Secondary | ICD-10-CM | POA: Diagnosis not present

## 2023-01-18 DIAGNOSIS — L814 Other melanin hyperpigmentation: Secondary | ICD-10-CM | POA: Diagnosis not present

## 2023-01-24 ENCOUNTER — Ambulatory Visit (HOSPITAL_COMMUNITY)
Admission: RE | Admit: 2023-01-24 | Discharge: 2023-01-24 | Disposition: A | Discharge status: Home / Self Care | Payer: Medicare (Managed Care) | Source: Ambulatory Visit | Attending: Emergency Medicine | Admitting: Emergency Medicine

## 2023-01-24 DIAGNOSIS — R0602 Shortness of breath: Secondary | ICD-10-CM | POA: Insufficient documentation

## 2023-01-24 LAB — PULMONARY FUNCTION TEST
DL/VA % pred: 88 %
DL/VA: 3.51 ml/min/mmHg/L
DLCO unc % pred: 67 %
DLCO unc: 14.64 ml/min/mmHg
FEF 25-75 Post: 2.72 L/s
FEF 25-75 Pre: 2.32 L/s
FEF2575-%Change-Post: 17 %
FEF2575-%Pred-Post: 147 %
FEF2575-%Pred-Pre: 125 %
FEV1-%Change-Post: 3 %
FEV1-%Pred-Post: 89 %
FEV1-%Pred-Pre: 86 %
FEV1-Post: 2.21 L
FEV1-Pre: 2.14 L
FEV1FVC-%Change-Post: 5 %
FEV1FVC-%Pred-Pre: 108 %
FEV6-%Change-Post: -1 %
FEV6-%Pred-Post: 82 %
FEV6-%Pred-Pre: 83 %
FEV6-Post: 2.58 L
FEV6-Pre: 2.61 L
FEV6FVC-%Change-Post: 1 %
FEV6FVC-%Pred-Post: 104 %
FEV6FVC-%Pred-Pre: 103 %
FVC-%Change-Post: -2 %
FVC-%Pred-Post: 79 %
FVC-%Pred-Pre: 80 %
FVC-Post: 2.59 L
FVC-Pre: 2.65 L
Post FEV1/FVC ratio: 85 %
Post FEV6/FVC ratio: 100 %
Pre FEV1/FVC ratio: 81 %
Pre FEV6/FVC Ratio: 99 %
RV % pred: 143 %
RV: 3.6 L
TLC % pred: 110 %
TLC: 6.26 L

## 2023-01-24 MED ORDER — ALBUTEROL SULFATE (2.5 MG/3ML) 0.083% IN NEBU
2.5000 mg | INHALATION_SOLUTION | Freq: Once | RESPIRATORY_TRACT | Status: AC
  Administered 2023-01-24: 2.5 mg via RESPIRATORY_TRACT

## 2023-02-01 ENCOUNTER — Ambulatory Visit: Payer: Medicare (Managed Care) | Admitting: Emergency Medicine

## 2023-02-01 ENCOUNTER — Encounter: Payer: Self-pay | Admitting: Emergency Medicine

## 2023-02-01 VITALS — BP 114/70 | HR 77 | Ht 68.0 in | Wt 234.0 lb

## 2023-02-01 DIAGNOSIS — R0609 Other forms of dyspnea: Secondary | ICD-10-CM | POA: Diagnosis not present

## 2023-02-01 MED ORDER — ALBUTEROL SULFATE HFA 108 (90 BASE) MCG/ACT IN AERS: 2.0000 | INHALATION_SPRAY | RESPIRATORY_TRACT | 2 refills | Status: AC | PRN

## 2023-02-01 NOTE — Assessment & Plan Note (Signed)
Chest x-ray, pulmonary function testing overall reassuring.  She does have some possible hyperinflation, decreased diffusion.  We will perform walking oximetry today.  I think will be reasonable to do a trial of albuterol to see if she gets benefit.  She is willing to do this.  We can talk about the response next time.  She is going to continue to work on her exercise and conditioning.

## 2023-02-01 NOTE — Patient Instructions (Signed)
We reviewed your pulmonary function testing and your chest x-ray today.  Both were reassuring.  There may be some subtle evidence for abnormal airflow or subclinical asthma. We will try using albuterol 2 puffs up to every 4 hours if needed for shortness of breath, chest tightness or wheezing.  You can try using it about 15 minutes prior to exercise to see if it helps your exercise tolerance. Get your CT chest as planned by Dr. Arbutus Ped in December. Follow Dr. Delton Coombes in December so we can discuss your symptoms and review that scan.

## 2023-02-01 NOTE — Progress Notes (Signed)
Subjective:    Patient ID: Stephanie Crawford, female    DOB: [birth year], 77 y.o.   MRN: [id number]  HPI 77 year old woman, never smoker, who has been seen in our office remotely for chronic cough, dyspnea, pulmonary nodular disease on CT scan of the chest.  I last saw her in March 2019 at which time she had stable right lower lobe subpleural consolidation, scattered smaller pulmonary nodules.  Subsequent evaluation did show interval change and she was diagnosed with stage IIb adenocarcinoma in 09/2018, underwent wedge resection and adjuvant chemotherapy.  She has been on observation and is followed by Dr. Arbutus Ped. She is here to discuss some increased SOB. She has tried to start walking more, notices some progression of exertional SOB. She pushes through, but she is dyspneic, doesn't have to stop to rest. She notices a lot of perspiration. Some L sided chest discomfort - but happens at random, not necessarily w the exertion. There may be some wheeze. She has some baseline cough in the am when she gets up.   Pulmonary function testing 02/09/2017 reviewed by me, shows principally restriction on spirometry with possible coexisting obstruction, restrictive lung volumes and a normal diffusion capacity.  CT chest 05/08/2022 reviewed by me    ROV 02/01/2023 --follow-up visit 77 year old woman with a history of chronic cough, dyspnea, wedge resection for stage IIb adenocarcinoma with adjuvant chemotherapy.  She is currently on observation.  She is a never smoker.  I saw her in late July for progressive shortness of breath on exertion, question wheeze.  She had a chest x-ray as below, pulmonary function testing. She has been walking, trying to exercise. She still has some exertional SOB.   Chest x-ray 12/19/2022 reviewed by me, shows no evidence of active disease, no evolving pleural effusion  Pulmonary function testing 01/24/2023 reviewed by me shows normal airflows without a bronchodilator response,  hyperinflation based on elevated RV, decreased diffusion capacity that does not fully correct when adjusted for alveolar volume.  There was some slight curve to the flow-volume loop but grossly normal.    Past Medical History:  Diagnosis Date   Allergic rhinitis    Allergy    Anemia    prior to hysterectomy--1997   Arthritis    Dyspnea 12/19/2022   GERD (gastroesophageal reflux disease)    Heart murmur    Hemorrhoid 2011   History of shingles 04/2014   Kidney stones    Lung cancer (HCC)    Nodule of lower lobe of right lung    Osteoporosis    Varicose veins of left lower extremity      Family History  Problem Relation Age of Onset   Cancer Father    Prostate cancer Father    Asthma Sister    Heart attack Brother    Prostate cancer Brother    Lung cancer Maternal Grandmother    Asthma Daughter    Colon cancer Neg Hx    Esophageal cancer Neg Hx    Pancreatic cancer Neg Hx    Stomach cancer Neg Hx    Liver disease Neg Hx    Rectal cancer Neg Hx    Breast cancer Neg Hx      Social History   Socioeconomic History   Marital status: Married    Spouse name: Not on file   Number of children: Not on file   Years of education: Not on file   Highest education level: Not on file  Occupational History   Not on  file  Tobacco Use   Smoking status: Never    Passive exposure: Never   Smokeless tobacco: Never  Vaping Use   Vaping status: Never Used  Substance and Sexual Activity   Alcohol use: No    Alcohol/week: 0.0 standard drinks of alcohol   Drug use: No   Sexual activity: Yes  Other Topics Concern   Not on file  Social History Narrative   [facility] (11/30/16):   Originally from [place], [place]. Grew up primarily in [place], [place]. She has also lived in [place]. She moved to [facility] in 2006. 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 Recently has  acquired an outside dog. No bird exposure. Possible mold problem in her current home with history of water damage. No hot tub exposure. Enjoys babysitting her grandsons. Remote travel to [place], [place], [place], [place], [place], [place], [place], [place], [place], & [place]    Social Determinants of Health   Financial Resource Strain: Not on file  Food Insecurity: Not on file  Transportation Needs: Not on file  Physical Activity: Not on file  Stress: Not on file  Social Connections: Not on file  Intimate Partner Violence: Not on file     Allergies  Allergen Reactions   Adhesive [Tape] Rash     Outpatient Medications Prior to Visit  Medication Sig Dispense Refill   alendronate (FOSAMAX) 70 MG tablet Take 1 tablet (70 mg total) by mouth once a week. Take with a full glass of water on an empty stomach. Stay upright for 30 minutes after taking medication. 12 tablet 0   aspirin 325 MG EC tablet Take 650 mg by mouth daily as needed for pain.     atorvastatin (LIPITOR) 10 MG tablet Take 1 tablet by mouth daily.     azelastine (OPTIVAR) 0.05 % ophthalmic solution Place 1 drop into both eyes daily as needed (irritation).     Biotin 5000 MCG CAPS Take 1 capsule by mouth daily.     calcium carbonate (OS-CAL) 600 MG TABS tablet Take 600 mg by mouth in the morning and at bedtime.     Cholecalciferol (VITAMIN D) 50 MCG (2000 UT) tablet Take 2,000 Units by mouth at bedtime.     COVID-19 mRNA vaccine 2023-2024 (COMIRNATY) syringe Inject into the muscle. 0.3 mL 0   diclofenac Sodium (VOLTAREN) 1 % GEL Apply 2-4 grams to affected joint 4 times daily as needed. 400 g 2   Multiple Vitamin (MULTIVITAMIN PO) Take by mouth daily.     VITAMIN A PO Take 2,400 mcg by mouth daily.     No facility-administered medications prior to visit.         Objective:   Physical Exam Vitals:   02/01/23 1555  BP: 114/70  Pulse: 77  SpO2: 98%  Weight: 234 lb (106.1 kg)  Height: 5\' 8"  (1.727 m)     Gen: Pleasant, well-nourished, in no distress,  normal affect  ENT: No lesions,  mouth clear,  oropharynx clear, no postnasal drip  Neck: No JVD, no stridor  Lungs: No use of accessory muscles, no crackles or wheezing on normal respiration, no wheeze on forced expiration  Cardiovascular: RRR, soft end systolic and early diastolic murmur  Musculoskeletal: No deformities, no cyanosis or clubbing  Neuro: alert, awake, non focal  Skin: Warm, no lesions or rash      Assessment & Plan:  Dyspnea Chest  x-ray, pulmonary function testing overall reassuring.  She does have some possible hyperinflation, decreased diffusion.  We will perform walking oximetry today.  I think will be reasonable to do a trial of albuterol to see if she gets benefit.  She is willing to do this.  We can talk about the response next time.  She is going to continue to work on her exercise and conditioning.    Levy Pupa, MD, PhD 02/01/2023, 4:15 PM [facility] [phone] or if no answer before 7:00PM call [phone] For any issues after 7:00PM please call eLink [phone]

## 2023-02-13 ENCOUNTER — Ambulatory Visit: Payer: Medicare (Managed Care) | Admitting: Emergency Medicine

## 2023-02-14 NOTE — Progress Notes (Signed)
Office Visit Note  Patient: Stephanie Crawford             Date of Birth: 03-23-1946           MRN: [id number]             PCP: Lorenda Ishihara, MD Referring: Lorenda Ishihara,* Visit Date: 02/27/2023 Occupation: @GUAROCC @  Subjective:  Medication management  History of Present Illness: Stephanie Crawford is a 77 y.o. female with osteoarthritis, degenerative disc disease and osteoporosis.  She had been on Fosamax since April 2022 which she has been tolerating well.  She continues to take calcium and vitamin D.  Her vitamin D has been in the desirable range.  She continues to have some stiffness in her hands.  She has not have any recent triggering of her fingers.  She has intermittent discomfort in the trochanteric region.  She also has intermittent pain in her knee joints without any swelling.  Lower back pain has been better.    Activities of Daily Living:  Patient reports morning stiffness for a few minutes.   Patient Reports nocturnal pain.  Difficulty dressing/grooming: Denies Difficulty climbing stairs: Denies Difficulty getting out of chair: Denies Difficulty using hands for taps, buttons, cutlery, and/or writing: Denies  Review of Systems  Constitutional:  Positive for fatigue.  HENT:  Positive for mouth dryness. Negative for mouth sores.   Eyes:  Negative for dryness.  Respiratory:  Positive for shortness of breath.   Cardiovascular:  Negative for chest pain and palpitations.  Gastrointestinal:  Negative for blood in stool, constipation and diarrhea.  Endocrine: Negative for increased urination.  Genitourinary:  Negative for involuntary urination.  Musculoskeletal:  Positive for joint pain, joint pain and morning stiffness. Negative for gait problem, joint swelling, myalgias, muscle weakness, muscle tenderness and myalgias.  Skin:  Negative for color change, rash, hair loss and sensitivity to sunlight.  Allergic/Immunologic: Negative for susceptible to  infections.  Neurological:  Positive for numbness. Negative for dizziness and headaches.  Hematological:  Negative for swollen glands.  Psychiatric/Behavioral:  Negative for depressed mood and sleep disturbance. The patient is not nervous/anxious.     PMFS History:  Patient Active Problem List   Diagnosis Date Noted   Dyspnea 12/19/2022   Hypertension 02/24/2019   Goals of care, counseling/discussion 11/14/2018   Adenocarcinoma of right lung, stage 2 (HCC) 10/25/2018   Encounter for antineoplastic chemotherapy 10/25/2018   S/P partial lobectomy of lung 10/09/2018   Primary osteoarthritis of both knees 05/17/2017   Primary osteoarthritis of both hands 05/17/2017   DDD (degenerative disc disease), lumbar 05/17/2017   History of gastroesophageal reflux (GERD) 05/17/2017   History of shingles 05/17/2017   History of cellulitis 05/17/2017   Multiple lung nodules on CT 11/30/2016   Cough 11/30/2016   Right knee pain 04/28/2015   Foot ulcer (HCC) 07/15/2014   Osteoporosis 01/30/2013   Vitamin D deficiency 01/13/2013   Other and unspecified hyperlipidemia 01/06/2013   Breast thickening 01/06/2013   Skin lesion of right leg 09/19/2012   Chronic seasonal allergic rhinitis 09/19/2012   Elevated bilirubin 07/12/2012   History of cardiac murmur 07/12/2012   Rash and nonspecific skin eruption 07/11/2012   Rectal bleeding 05/11/2011   Abnormal EKG 03/01/2011   General medical examination 03/01/2011   GERD 12/08/2009   ANKLE EDEMA 12/08/2009    Past Medical History:  Diagnosis Date   Allergic rhinitis    Allergy    Anemia    prior to hysterectomy--1997  Arthritis    Dyspnea 12/19/2022   GERD (gastroesophageal reflux disease)    Heart murmur    Hemorrhoid 2011   History of shingles 04/2014   Kidney stones    Lung cancer (HCC)    Nodule of lower lobe of right lung    Osteoporosis    Varicose veins of left lower extremity     Family History  Problem Relation Age of Onset    Cancer Father    Prostate cancer Father    Asthma Sister    Heart attack Brother    Prostate cancer Brother    Lung cancer Maternal Grandmother    Asthma Daughter    Colon cancer Neg Hx    Esophageal cancer Neg Hx    Pancreatic cancer Neg Hx    Stomach cancer Neg Hx    Liver disease Neg Hx    Rectal cancer Neg Hx    Breast cancer Neg Hx    Past Surgical History:  Procedure Laterality Date   ABDOMINAL HYSTERECTOMY  1997   APPENDECTOMY  1960   BREAST EXCISIONAL BIOPSY Left    decades ago- "something was removed"   HEMORRHOID SURGERY  01/2010   8/27 had a follow up procedure.     IR THORACENTESIS ASP PLEURAL SPACE W/IMG GUIDE  10/24/2018   LIPOMA EXCISION  1975   neck, left breast and righ jaw.   VIDEO ASSISTED THORACOSCOPY (VATS)/WEDGE RESECTION Right 10/09/2018   Procedure: VIDEO ASSISTED THORACOSCOPY (VATS)/WEDGE RESECTION;  Surgeon: Loreli Slot, MD;  Location: [facility] OR;  Service: Thoracic;  Laterality: Right;   WRIST FRACTURE SURGERY     Social History   Social History Narrative   [facility] (11/30/16):   Originally from [place], [place]. Grew up primarily in [place], [place]. She has also lived in [place]. She moved to [facility] in 2006. 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 Recently has acquired an outside dog. No bird exposure. Possible mold problem in her current home with history of water damage. No hot tub exposure. Enjoys babysitting her grandsons. Remote travel to [place], [place], [place], [place], [place], [place], [place], [place], [place], & [place]    Immunization History  Administered Date(s) Administered   PFIZER(Purple Top)SARS-COV-2 Vaccination 07/17/2019, 08/13/2019, 04/03/2020   PNEUMOCOCCAL CONJUGATE-20 10/31/2022   Pfizer Covid-19 Vaccine Bivalent Booster 56yrs & up 05/18/2021   Pfizer(Comirnaty)Fall Seasonal Vaccine  12 years and older 04/20/2022   Td 06/22/2009   Tdap 01/06/2013     Objective: Vital Signs: BP 114/76 (BP Location: Left Arm, Patient Position: Sitting, Cuff Size: Normal)   Pulse 79   Resp 16   Ht 5\' 8"  (1.727 m)   Wt 230 lb 12.8 oz (104.7 kg)   LMP 05/23/1995   BMI 35.09 kg/m    Physical Exam Vitals and nursing note reviewed.  Constitutional:      Appearance: She is well-developed.  HENT:     Head: Normocephalic and atraumatic.  Eyes:     Conjunctiva/sclera: Conjunctivae normal.  Cardiovascular:     Rate and Rhythm: Normal rate and regular rhythm.     Heart sounds: Normal heart sounds.  Pulmonary:     Effort: Pulmonary effort is normal.     Breath sounds: Normal breath sounds.  Abdominal:     General: Bowel sounds are normal.     Palpations: Abdomen is soft.  Musculoskeletal:     Cervical back:  Normal range of motion.  Lymphadenopathy:     Cervical: No cervical adenopathy.  Skin:    General: Skin is warm and dry.     Capillary Refill: Capillary refill takes less than 2 seconds.  Neurological:     Mental Status: She is alert and oriented to person, place, and time.  Psychiatric:        Behavior: Behavior normal.      Musculoskeletal Exam: Cervical, thoracic and lumbar spine with good range of motion.  Shoulder joints, elbow joints, wrist joints, MCPs PIPs and DIPs with good range of motion.  She had bilateral PIP and DIP thickening.  CMC prominence was noted.  No synovitis was noted.  Hip joints were in good range of motion.  Knee joints were in good range of motion with some limitation of extension of her right knee joint.  No warmth swelling or effusion was noted.  There was no tenderness over ankles or MTPs.  CDAI Exam: CDAI Score: -- Patient Global: --; Provider Global: -- Swollen: --; Tender: -- Joint Exam 02/27/2023   No joint exam has been documented for this visit   There is currently no information documented on the homunculus. Go to the Rheumatology  activity and complete the homunculus joint exam.  Investigation: No additional findings.  Imaging: No results found.  Recent Labs: Lab Results  Component Value Date   WBC 7.2 05/08/2022   HGB 14.7 05/08/2022   PLT 276 05/08/2022   NA 141 08/29/2022   K 4.1 08/29/2022   CL 103 08/29/2022   CO2 26 08/29/2022   GLUCOSE 89 08/29/2022   BUN 14 08/29/2022   CREATININE 0.98 08/29/2022   BILITOT 1.1 08/29/2022   ALKPHOS 61 05/08/2022   AST 23 08/29/2022   ALT 20 08/29/2022   PROT 6.8 08/29/2022   ALBUMIN 4.2 05/08/2022   CALCIUM 10.0 08/29/2022   GFRAA 55 (L) 10/21/2019    Speciality Comments: Fosamax 04/22  Procedures:  No procedures performed Allergies: Adhesive [tape]   Assessment / Plan:     Visit Diagnoses: Age-related osteoporosis without current pathological fracture - April 18, 2022 the BMD measured at Femur Neck Right is 0.635 g/cm2 with a T-score of -2.9.  No comparison was available.  Patient has been on Fosamax 70 mg p.o. weekly since April 2022 which she has been tolerating well.  Patient denies any interruption in the treatment.  She has been taking calcium and vitamin D.  She has been exercising on a regular basis.  We will repeat DEXA scan after November 2025.  She will continue Fosamax for now.  History of vitamin D deficiency-she takes vitamin D 2000 units daily.  Vitamin D was normal 69 on August 29, 2022.  Medication monitoring encounter - previously treated with Boniva and Fosamax 16 years ago.  December 17, 2019 left femoral neck BMD 0.647, T score -2.9. Fosamax 70 mg po q week started 08/2020.  Primary osteoarthritis of both hands-she has bilateral CMC PIP and DIP thickening.  No synovitis was noted.  Joint protection muscle strengthening was discussed.  Trigger finger, right middle finger-currently not symptomatic.  Patient was advised to contact us if her trigger finger becomes more symptomatic.  Primary osteoarthritis of both knees - moderate OA and  moderate chondromalacia patella.  Patient is having problems with range of motion of her right knee joint.  No warmth swelling or effusion was noted.  I will refer her to physical therapy.  A handout on exercises was given.  Weight loss and diet was also emphasized.  Trochanteric bursitis of both hips-she has intermittent symptoms.  IT band stretches were advised.  DDD (degenerative disc disease), lumbar-she is currently not having any symptoms.  Core strengthening exercises were discussed.  Adenocarcinoma of right lung, stage 2 (HCC) - diagnosed with adenocarcinoma stage 2.  She had a partial lobectomy of the right lung and s/p CTX.  S/P partial lobectomy of lung  Dyslipidemia  History of cardiac murmur  History of cellulitis  History of gastroesophageal reflux (GERD)  History of shingles  Orders: No orders of the defined types were placed in this encounter.  No orders of the defined types were placed in this encounter.    Follow-Up Instructions: Return in about 6 months (around 08/28/2023) for Osteoarthritis, Osteoporosis.   Pollyann Savoy, MD  Note - This record has been created using Animal nutritionist.  Chart creation errors have been sought, but may not always  have been located. Such creation errors do not reflect on  the standard of medical care.

## 2023-02-23 ENCOUNTER — Telehealth: Payer: Self-pay | Admitting: Rheumatology

## 2023-02-23 NOTE — Telephone Encounter (Signed)
Pt called stating that her insurance does not cover her to see Dr. Corliss Skains. She would like to know if this is true and if so what insurance does she need to be in network with her. She would like an answer before her appt on 02/27/23. Call back number is [phone]. I ran the insurance through epic and it verified in our system. It does not show [facility]. Pt is aware of this.

## 2023-02-26 NOTE — Telephone Encounter (Signed)
This patient does not have BCBS. This patient has [place].

## 2023-02-27 ENCOUNTER — Encounter: Payer: Self-pay | Admitting: Rheumatology

## 2023-02-27 ENCOUNTER — Ambulatory Visit: Payer: Medicare (Managed Care) | Attending: Rheumatology | Admitting: Rheumatology

## 2023-02-27 VITALS — BP 114/76 | HR 79 | Resp 16 | Ht 68.0 in | Wt 230.8 lb

## 2023-02-27 DIAGNOSIS — Z5181 Encounter for therapeutic drug level monitoring: Secondary | ICD-10-CM | POA: Diagnosis not present

## 2023-02-27 DIAGNOSIS — M810 Age-related osteoporosis without current pathological fracture: Secondary | ICD-10-CM | POA: Diagnosis not present

## 2023-02-27 DIAGNOSIS — C3491 Malignant neoplasm of unspecified part of right bronchus or lung: Secondary | ICD-10-CM

## 2023-02-27 DIAGNOSIS — Z902 Acquired absence of lung [part of]: Secondary | ICD-10-CM

## 2023-02-27 DIAGNOSIS — M7061 Trochanteric bursitis, right hip: Secondary | ICD-10-CM

## 2023-02-27 DIAGNOSIS — Z872 Personal history of diseases of the skin and subcutaneous tissue: Secondary | ICD-10-CM

## 2023-02-27 DIAGNOSIS — Z8679 Personal history of other diseases of the circulatory system: Secondary | ICD-10-CM

## 2023-02-27 DIAGNOSIS — M19041 Primary osteoarthritis, right hand: Secondary | ICD-10-CM

## 2023-02-27 DIAGNOSIS — Z8619 Personal history of other infectious and parasitic diseases: Secondary | ICD-10-CM

## 2023-02-27 DIAGNOSIS — M7062 Trochanteric bursitis, left hip: Secondary | ICD-10-CM

## 2023-02-27 DIAGNOSIS — E785 Hyperlipidemia, unspecified: Secondary | ICD-10-CM | POA: Diagnosis not present

## 2023-02-27 DIAGNOSIS — M5136 Other intervertebral disc degeneration, lumbar region: Secondary | ICD-10-CM

## 2023-02-27 DIAGNOSIS — M170 Bilateral primary osteoarthritis of knee: Secondary | ICD-10-CM | POA: Diagnosis not present

## 2023-02-27 DIAGNOSIS — M51369 Other intervertebral disc degeneration, lumbar region without mention of lumbar back pain or lower extremity pain: Secondary | ICD-10-CM

## 2023-02-27 DIAGNOSIS — M19042 Primary osteoarthritis, left hand: Secondary | ICD-10-CM

## 2023-02-27 DIAGNOSIS — M65331 Trigger finger, right middle finger: Secondary | ICD-10-CM | POA: Diagnosis not present

## 2023-02-27 DIAGNOSIS — Z8719 Personal history of other diseases of the digestive system: Secondary | ICD-10-CM

## 2023-02-27 DIAGNOSIS — Z8639 Personal history of other endocrine, nutritional and metabolic disease: Secondary | ICD-10-CM | POA: Diagnosis not present

## 2023-02-27 NOTE — Patient Instructions (Signed)
Exercises for Chronic Knee Pain Chronic knee pain is pain that lasts longer than 3 months. For most people with chronic knee pain, exercise and weight loss is an important part of treatment. Your health care provider may want you to focus on: Making the muscles that support your knee stronger. This can take pressure off your knee and reduce pain. Preventing knee stiffness. How far you can move your knee, keeping it there or making it farther. Losing weight (if this applies) to take pressure off your knee, lower your risk for injury, and make it easier for you to exercise. Your provider will help you make an exercise program that fits your needs and physical abilities. Below are simple, low-impact exercises you can do at home. Ask your provider or physical therapist how often you should do your exercise program and how many times to repeat each exercise. General safety tips  Get your provider's approval before doing any exercises. Start slowly and stop any time you feel pain. Do not exercise if your knee pain is flaring up. Warm up first. Stretching a cold muscle can cause an injury. Do 5-10 minutes of easy movement or light stretching before beginning your exercises. Do 5-10 minutes of low-impact activity (like walking or cycling) before starting strengthening exercises. Contact your provider any time you have pain during or after exercising. Exercise can cause discomfort but should not be painful. It is normal to be a little stiff or sore after exercising. Stretching and range-of-motion exercises Front thigh stretch  Stand up straight and support your body by holding on to a chair or resting one hand on a wall. With your legs straight and close together, bend one knee to lift your heel up toward your butt. Using one hand for support, grab your ankle with your free hand. Pull your foot up closer toward your butt to feel the stretch in front of your thigh. Hold the stretch for 30  seconds. Repeat __________ times. Complete this exercise __________ times a day. Back thigh stretch  Sit on the floor with your back straight and your legs out straight in front of you. Place the palms of your hands on the floor and slide them toward your feet as you bend at the hip. Try to touch your nose to your knees and feel the stretch in the back of your thighs. Hold for 30 seconds. Repeat __________ times. Complete this exercise __________ times a day. Calf stretch  Stand facing a wall. Place the palms of your hands flat against the wall, arms extended, and lean slightly against the wall. Get into a lunge position with one leg bent at the knee and the other leg stretched out straight behind you. Keep both feet facing the wall and increase the bend in your knee while keeping the heel of the other leg flat on the ground. You should feel the stretch in your calf. Hold for 30 seconds. Repeat __________ times. Complete this exercise __________ times a day. Strengthening exercises Straight leg lift  Lie on your back with one knee bent and the other leg out straight. Slowly lift the straight leg without bending the knee. Lift until your foot is about 12 inches (30 cm) off the floor. Hold for 3-5 seconds and slowly lower your leg. Repeat __________ times. Complete this exercise __________ times a day. Single leg dip  Stand between two chairs and put both hands on the backs of the chairs for support. Extend one leg out straight with your body   weight resting on the heel of the standing leg. Slowly bend your standing knee to dip your body to the level that is comfortable for you. Hold for 3-5 seconds. Repeat __________ times. Complete this exercise __________ times a day. Hamstring curls  Stand straight, knees close together, facing the back of a chair. Hold on to the back of a chair with both hands. Keep one leg straight. Bend the other knee while bringing the heel up toward the butt  until the knee is bent at a 90-degree angle (right angle). Hold for 3-5 seconds. Repeat __________ times. Complete this exercise __________ times a day. Wall squat  Stand straight with your back, hips, and head against a wall. Step forward one foot at a time with your back still against the wall. Your feet should be 2 feet (61 cm) from the wall at shoulder width. Keeping your back, hips, and head against the wall, slide down the wall to as close to a sitting position as you can get. Hold for 5-10 seconds, then slowly slide back up. Repeat __________ times. Complete this exercise __________ times a day. Step-ups  Stand in front of a sturdy platform or stool that is about 6 inches (15 cm) high. Slowly step up with your left / right foot, keeping your knee in line with your hip and foot. Do not let your knee bend so far that you cannot see your toes. Hold on to a chair for balance, but do not use it for support. Slowly unlock your knee and lower yourself to the starting position. Repeat __________ times. Complete this exercise __________ times a day. Contact a health care provider if: Your exercises cause pain. Your pain is worse after you exercise. Your pain prevents you from doing your exercises. This information is not intended to replace advice given to you by your health care provider. Make sure you discuss any questions you have with your health care provider. Document Revised: 05/23/2022 Document Reviewed: 05/23/2022 Elsevier Patient Education  2024 [employer]  

## 2023-03-15 ENCOUNTER — Other Ambulatory Visit: Payer: Self-pay

## 2023-03-15 ENCOUNTER — Ambulatory Visit: Payer: Medicare (Managed Care) | Attending: Rheumatology

## 2023-03-15 DIAGNOSIS — M25661 Stiffness of right knee, not elsewhere classified: Secondary | ICD-10-CM | POA: Insufficient documentation

## 2023-03-15 DIAGNOSIS — M170 Bilateral primary osteoarthritis of knee: Secondary | ICD-10-CM | POA: Insufficient documentation

## 2023-03-15 DIAGNOSIS — R262 Difficulty in walking, not elsewhere classified: Secondary | ICD-10-CM | POA: Diagnosis not present

## 2023-03-15 DIAGNOSIS — M25561 Pain in right knee: Secondary | ICD-10-CM | POA: Insufficient documentation

## 2023-03-15 DIAGNOSIS — M25662 Stiffness of left knee, not elsewhere classified: Secondary | ICD-10-CM | POA: Diagnosis not present

## 2023-03-15 DIAGNOSIS — G8929 Other chronic pain: Secondary | ICD-10-CM | POA: Diagnosis not present

## 2023-03-15 DIAGNOSIS — M25562 Pain in left knee: Secondary | ICD-10-CM | POA: Insufficient documentation

## 2023-03-15 NOTE — Therapy (Signed)
OUTPATIENT PHYSICAL THERAPY LOWER EXTREMITY EVALUATION   Patient Name: Stephanie Crawford MRN: [id number] DOB:[birth year], 77 y.o., female Today's Date: 03/15/2023  END OF SESSION:  PT End of Session - 03/15/23 1709     Visit Number 1    Date for PT Re-Evaluation 05/10/23    Progress Note Due on Visit 10    PT Start Time 1403    PT Stop Time 1445    PT Time Calculation (min) 42 min    Activity Tolerance Patient tolerated treatment well    Behavior During Therapy [facility] for tasks assessed/performed             Past Medical History:  Diagnosis Date   Allergic rhinitis    Allergy    Anemia    prior to hysterectomy--1997   Arthritis    Dyspnea 12/19/2022   GERD (gastroesophageal reflux disease)    Heart murmur    Hemorrhoid 2011   History of shingles 04/2014   Kidney stones    Lung cancer (HCC)    Nodule of lower lobe of right lung    Osteoporosis    Varicose veins of left lower extremity    Past Surgical History:  Procedure Laterality Date   ABDOMINAL HYSTERECTOMY  1997   APPENDECTOMY  1960   BREAST EXCISIONAL BIOPSY Left    decades ago- "something was removed"   HEMORRHOID SURGERY  01/2010   8/27 had a follow up procedure.     IR THORACENTESIS ASP PLEURAL SPACE W/IMG GUIDE  10/24/2018   LIPOMA EXCISION  1975   neck, left breast and righ jaw.   VIDEO ASSISTED THORACOSCOPY (VATS)/WEDGE RESECTION Right 10/09/2018   Procedure: VIDEO ASSISTED THORACOSCOPY (VATS)/WEDGE RESECTION;  Surgeon: Loreli Slot, MD;  Location: [facility] OR;  Service: Thoracic;  Laterality: Right;   WRIST FRACTURE SURGERY     Patient Active Problem List   Diagnosis Date Noted   Dyspnea 12/19/2022   Hypertension 02/24/2019   Goals of care, counseling/discussion 11/14/2018   Adenocarcinoma of right lung, stage 2 (HCC) 10/25/2018   Encounter for antineoplastic chemotherapy 10/25/2018   S/P partial lobectomy of lung 10/09/2018   Primary osteoarthritis of both knees 05/17/2017   Primary  osteoarthritis of both hands 05/17/2017   DDD (degenerative disc disease), lumbar 05/17/2017   History of gastroesophageal reflux (GERD) 05/17/2017   History of shingles 05/17/2017   History of cellulitis 05/17/2017   Multiple lung nodules on CT 11/30/2016   Cough 11/30/2016   Right knee pain 04/28/2015   Foot ulcer (HCC) 07/15/2014   Osteoporosis 01/30/2013   Vitamin D deficiency 01/13/2013   Other and unspecified hyperlipidemia 01/06/2013   Breast thickening 01/06/2013   Skin lesion of right leg 09/19/2012   Chronic seasonal allergic rhinitis 09/19/2012   Elevated bilirubin 07/12/2012   History of cardiac murmur 07/12/2012   Rash and nonspecific skin eruption 07/11/2012   Rectal bleeding 05/11/2011   Abnormal EKG 03/01/2011   General medical examination 03/01/2011   GERD 12/08/2009   ANKLE EDEMA 12/08/2009    PCP: Ewing Schlein, MD  REFERRING PROVIDER: Pollyann Savoy, MD  REFERRING DIAG: B knee OA  THERAPY DIAG:  Chronic pain of both knees  Stiffness of right knee, not elsewhere classified  Stiffness of left knee, not elsewhere classified  Difficulty in walking, not elsewhere classified  Rationale for Evaluation and Treatment: Rehabilitation  ONSET DATE: chronic, worse last 6 months  SUBJECTIVE:   SUBJECTIVE STATEMENT: B knee pain for several years, worse recently, referred to PT  by her rheumatologist  PERTINENT HISTORY: H/o B knee pain, progressive, losing some ROM R knee per referring MD's notes.  Also h/o R lung Ca and partial lobectomy PAIN:  Are you having pain? Yes: NPRS scale: B knee pain, 0 to5 /10 Pain location: ant knees, patellar tendons Pain description: deep pain Aggravating factors: walking uneven terrain, hills Relieving factors: cortisone shots  PRECAUTIONS: Other: osteoporosis  RED FLAGS: None   WEIGHT BEARING RESTRICTIONS: No  FALLS:  Has patient fallen in last 6 months? 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  Also primary driver in household.   PLOF: Independent  PATIENT GOALS: be able to function better, walk less pain B knees  NEXT MD VISIT: unclear  OBJECTIVE:  Note: Objective measures were completed at Evaluation unless otherwise noted.  DIAGNOSTIC FINDINGS: copied from 03/14/22: x rays of each knee : These findings are consistent with moderate osteoarthritis and  moderate chondromalacia patella.   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   EDEMA:  Mod edema R medial knee jt  MUSCLE LENGTH: Hamstrings: Right wnl deg; Left wnl deg Maisie Fus test: Right nt deg; Left nt deg  POSTURE: increased lumbar lordosis, increased thoracic kyphosis, anterior pelvic tilt, weight shift left, and L iliac crest elevated, adaptive scoliotic changes with L lumbar concavity  PALPATION: Tender B knees infrapatellar tendon Decreased patellar mobility R  LOWER EXTREMITY ROM:  Active ROM Right eval Left eval  Hip flexion    Hip extension    Hip abduction    Hip adduction    Hip internal rotation    Hip external rotation -15   Knee flexion 112 124  Knee extension -16 -12  Ankle dorsiflexion    Ankle plantarflexion    Ankle inversion    Ankle eversion     (Blank rows = not tested)  LOWER EXTREMITY MMT: all MMT for LE' s wfl   FUNCTIONAL TESTS:  5 times sit to stand: 16.37  GAIT: Distance walked: 82' in clinic Assistive device utilized: None Level of assistance: Complete Independence Comments: maintains B hips, knees flexed to 15  degrees   TODAY'S TREATMENT:                                                                                                                              DATE: 03/15/23:    PATIENT EDUCATION:  Education details: POC, goals Person educated: Patient Education method: Explanation, Demonstration, Actor cues, Verbal cues, and Handouts Education comprehension: verbalized understanding, returned demonstration, and verbal cues required  HOME EXERCISE  PROGRAM: Initiated today, patellar mobs and standing plantarflexor stretching.  ASSESSMENT:  CLINICAL IMPRESSION: Patient is a 77 y.o. female,  who was evaluated today by skilled physical therapy due to advancing B knee OA and pain, with loss of tolerance for prolonged walking and loss of flexibility B knees, greater on R than L.  She is quite busy as a caregiver for her husband and negotiates steps several times a day in her home in this roll.  Strength legs tested well today.  Needs additional soft tissue stretching. Does have asymmetry in iliac crest height, did trial of 1/8" cork under R shoe with improved standing balance, she is to try in her walking shoes at home.  Will follow up with her in 3 weeks, her husband is having a procedure and she cannot come back until then. Initiated stretching B calves today and self patellar mobilization  OBJECTIVE IMPAIRMENTS: decreased activity tolerance, difficulty walking, decreased ROM, increased fascial restrictions, impaired flexibility, and pain.   ACTIVITY LIMITATIONS: carrying, lifting, bending, squatting, stairs, locomotion level, and caring for others  PARTICIPATION LIMITATIONS: meal prep, cleaning, laundry, shopping, and community activity  PERSONAL FACTORS: Age, Past/current experiences, Time since onset of injury/illness/exacerbation, and 1-2 comorbidities: h/o R lung Ca with partial lobectomy, peripheral neuropathy  are also affecting patient's functional outcome.   REHAB POTENTIAL:  Good  CLINICAL DECISION MAKING: Stable/uncomplicated  EVALUATION COMPLEXITY: Low   GOALS: Goals reviewed with patient? Yes  SHORT TERM GOALS: Target date: 03/29/23 I HEP  Baseline: Goal status: INITIAL  LONG TERM GOALS: Target date: 05/10/23  Improve KOOS 12 score from 50% disability to 25% or less Baseline:  Goal status: INITIAL  2.  Improve B knee extension ROM from -12 to -5 or better to improve patellar positioning for gait Baseline Goal status: INITIAL  3.  30 sec sit to stand improve from 16.37 sec to 14 Baseline:  Goal status: INITIAL     PLAN:  PT FREQUENCY: every other week  PT DURATION: 8 weeks  PLANNED INTERVENTIONS: 97110-Therapeutic exercises, 97530- Therapeutic activity, [id number]- Neuromuscular re-education, 97535- Self Care, and [id number]- Manual therapy  PLAN FOR NEXT SESSION: how did initial exercise work? Assess hip flexibility and add hip rotation stretches, also hip flexor/ quads stretches, deep tissue work B quads   Vineeth Fell Frazier Richards, PT, DPT, OCS 03/15/2023, 5:11 PM

## 2023-03-30 ENCOUNTER — Other Ambulatory Visit: Payer: Self-pay | Admitting: *Deleted

## 2023-03-30 MED ORDER — ALENDRONATE SODIUM 70 MG PO TABS: 70.0000 mg | ORAL_TABLET | ORAL | 0 refills | Status: DC

## 2023-03-30 NOTE — Telephone Encounter (Signed)
Refill request received via fax from [facility]  for Fosamax   Last Fill: 11/21/2022  Labs: 08/29/2022 CMP is normal.  Vitamin D is normal at 69.   Next Visit: 08/28/2023  Last Visit: 02/27/2023  DX: Age-related osteoporosis without current pathological fracture   Current Dose per office note 02/27/2023: Fosamax 70 mg p.o. weekly   Okay to refill Fosamax?

## 2023-04-05 ENCOUNTER — Ambulatory Visit: Payer: Medicare (Managed Care)

## 2023-04-11 NOTE — Telephone Encounter (Signed)
Telephone call  

## 2023-04-24 ENCOUNTER — Ambulatory Visit: Payer: Medicare (Managed Care) | Attending: Rheumatology

## 2023-05-08 DIAGNOSIS — N1831 Chronic kidney disease, stage 3a: Secondary | ICD-10-CM | POA: Diagnosis not present

## 2023-05-08 DIAGNOSIS — I868 Varicose veins of other specified sites: Secondary | ICD-10-CM | POA: Diagnosis not present

## 2023-05-08 DIAGNOSIS — C3491 Malignant neoplasm of unspecified part of right bronchus or lung: Secondary | ICD-10-CM | POA: Diagnosis not present

## 2023-05-09 ENCOUNTER — Ambulatory Visit (HOSPITAL_COMMUNITY)
Admission: RE | Admit: 2023-05-09 | Discharge: 2023-05-09 | Disposition: A | Discharge status: Home / Self Care | Payer: Medicare (Managed Care) | Source: Ambulatory Visit | Attending: Internal Medicine | Admitting: Internal Medicine

## 2023-05-09 ENCOUNTER — Inpatient Hospital Stay: Payer: Medicare (Managed Care) | Attending: Internal Medicine

## 2023-05-09 DIAGNOSIS — Z902 Acquired absence of lung [part of]: Secondary | ICD-10-CM | POA: Insufficient documentation

## 2023-05-09 DIAGNOSIS — Z85118 Personal history of other malignant neoplasm of bronchus and lung: Secondary | ICD-10-CM | POA: Insufficient documentation

## 2023-05-09 DIAGNOSIS — C3490 Malignant neoplasm of unspecified part of unspecified bronchus or lung: Secondary | ICD-10-CM | POA: Insufficient documentation

## 2023-05-09 DIAGNOSIS — R918 Other nonspecific abnormal finding of lung field: Secondary | ICD-10-CM | POA: Diagnosis not present

## 2023-05-09 DIAGNOSIS — Z9221 Personal history of antineoplastic chemotherapy: Secondary | ICD-10-CM | POA: Insufficient documentation

## 2023-05-09 DIAGNOSIS — Z08 Encounter for follow-up examination after completed treatment for malignant neoplasm: Secondary | ICD-10-CM | POA: Diagnosis not present

## 2023-05-09 LAB — CMP (CANCER CENTER ONLY)
ALT: 30 U/L (ref 0–44)
AST: 53 U/L — ABNORMAL HIGH (ref 15–41)
Albumin: 4.3 g/dL (ref 3.5–5.0)
Alkaline Phosphatase: 63 U/L (ref 38–126)
Anion gap: 8 (ref 5–15)
BUN: 15 mg/dL (ref 8–23)
CO2: 27 mmol/L (ref 22–32)
Calcium: 9.9 mg/dL (ref 8.9–10.3)
Chloride: 103 mmol/L (ref 98–111)
Creatinine: 1.01 mg/dL — ABNORMAL HIGH (ref 0.44–1.00)
GFR, Estimated: 58 mL/min — ABNORMAL LOW (ref >60)
Glucose, Bld: 92 mg/dL (ref 70–99)
Potassium: 3.6 mmol/L (ref 3.5–5.1)
Sodium: 138 mmol/L (ref 135–145)
Total Bilirubin: 1.5 mg/dL — ABNORMAL HIGH (ref <1.2)
Total Protein: 7 g/dL (ref 6.5–8.1)

## 2023-05-09 LAB — CBC WITH DIFFERENTIAL (CANCER CENTER ONLY)
Abs Immature Granulocytes: 0.02 10*3/uL (ref 0.00–0.07)
Basophils Absolute: 0.1 10*3/uL (ref 0.0–0.1)
Basophils Relative: 1 %
Eosinophils Absolute: 0.2 10*3/uL (ref 0.0–0.5)
Eosinophils Relative: 3 %
HCT: 39.7 % (ref 36.0–46.0)
Hemoglobin: 13.7 g/dL (ref 12.0–15.0)
Immature Granulocytes: 0 %
Lymphocytes Relative: 40 %
Lymphs Abs: 3.1 10*3/uL (ref 0.7–4.0)
MCH: 30.9 pg (ref 26.0–34.0)
MCHC: 34.5 g/dL (ref 30.0–36.0)
MCV: 89.6 fL (ref 80.0–100.0)
Monocytes Absolute: 0.8 10*3/uL (ref 0.1–1.0)
Monocytes Relative: 10 %
Neutro Abs: 3.5 10*3/uL (ref 1.7–7.7)
Neutrophils Relative %: 46 %
Platelet Count: 298 10*3/uL (ref 150–400)
RBC: 4.43 MIL/uL (ref 3.87–5.11)
RDW: 12.3 % (ref 11.5–15.5)
WBC Count: 7.8 10*3/uL (ref 4.0–10.5)
nRBC: 0 % (ref 0.0–0.2)

## 2023-05-09 MED ORDER — IOHEXOL 300 MG/ML  SOLN
75.0000 mL | Freq: Once | INTRAMUSCULAR | Status: AC | PRN
  Administered 2023-05-09: 75 mL via INTRAVENOUS

## 2023-05-14 ENCOUNTER — Inpatient Hospital Stay: Payer: Medicare (Managed Care) | Admitting: Internal Medicine

## 2023-05-14 VITALS — BP 144/74 | HR 86 | Temp 97.1°F | Resp 16 | Ht 68.0 in | Wt 233.1 lb

## 2023-05-14 DIAGNOSIS — Z85118 Personal history of other malignant neoplasm of bronchus and lung: Secondary | ICD-10-CM | POA: Diagnosis not present

## 2023-05-14 DIAGNOSIS — C3490 Malignant neoplasm of unspecified part of unspecified bronchus or lung: Secondary | ICD-10-CM

## 2023-05-14 DIAGNOSIS — I2510 Atherosclerotic heart disease of native coronary artery without angina pectoris: Secondary | ICD-10-CM

## 2023-05-14 DIAGNOSIS — Z08 Encounter for follow-up examination after completed treatment for malignant neoplasm: Secondary | ICD-10-CM | POA: Diagnosis not present

## 2023-05-14 DIAGNOSIS — R0600 Dyspnea, unspecified: Secondary | ICD-10-CM | POA: Diagnosis not present

## 2023-05-14 NOTE — Progress Notes (Signed)
[facility] [phone]   [phone]  OFFICE PROGRESS NOTE  Lorenda Ishihara, MD [address] [place] [address]  DIAGNOSIS: Stage IIB (T3, N0, M0) non-small cell lung cancer, adenocarcinoma diagnosed in May 2020.  Biomarker Findings Microsatellite status - Cannot Be Determined Tumor Mutational Burden - Cannot Be Determined Genomic Findings For a complete list of the genes assayed, please refer to the Appendix. KRAS G12V TP53 G244V 7 Disease relevant genes with no reportable alterations: ALK, BRAF, EGFR, ERBB2, MET, RET, ROS1   PDL 1 expression: negative   PRIOR THERAPY:  1) Status post wedge resection x3 of the right lower lobe with lymph node sampling under the care of Dr. Dorris Fetch on 10/09/2018. 2) Adjuvant systemic chemotherapy with cisplatin 75 mg/M2 and Alimta 500 mg/M2 every 3 weeks.  First dose November 22, 2018.  Status post 4 cycles.  Last dose was given on 01/28/2019.  CURRENT THERAPY: Observation.  INTERVAL HISTORY: Stephanie Crawford 77 y.o. female returns to the clinic today for annual follow-up visit.Discussed the use of AI scribe software for clinical note transcription with the patient, who gave verbal consent to proceed.  History of Present Illness   Stephanie Crawford, a 77 year old patient with a history of stage IIB lung cancer, underwent surgery in 2020, followed by four cycles of cisplatin and Alimta chemotherapy. Since then, there have been no observations of recurrence. Over the past year, the patient has experienced shortness of breath upon exertion, particularly during thrice-weekly walks. There is occasional coughing, but no chest pain or hemoptysis. The patient denies any gastrointestinal symptoms and has not experienced any recent weight loss.  The patient also reports non-specific symptoms such as knee pain and cold feet at night. There is a mention of incidental findings on the scan,  including a cyst in the liver, a small area in the lung, and coronary atherosclerosis. The patient's cancer has remained stable over the past year, approaching five years post-diagnosis in May.       MEDICAL HISTORY: Past Medical History:  Diagnosis Date   Allergic rhinitis    Allergy    Anemia    prior to hysterectomy--1997   Arthritis    Dyspnea 12/19/2022   GERD (gastroesophageal reflux disease)    Heart murmur    Hemorrhoid 2011   History of shingles 04/2014   Kidney stones    Lung cancer (HCC)    Nodule of lower lobe of right lung    Osteoporosis    Varicose veins of left lower extremity     ALLERGIES:  is allergic to adhesive [tape].  MEDICATIONS:  Current Outpatient Medications  Medication Sig Dispense Refill   albuterol (VENTOLIN HFA) 108 (90 Base) MCG/ACT inhaler Inhale 2 puffs into the lungs every 4 (four) hours as needed for wheezing or shortness of breath. 18 g 2   alendronate (FOSAMAX) 70 MG tablet Take 1 tablet (70 mg total) by mouth once a week. Take with a full glass of water on an empty stomach. Stay upright for 30 minutes after taking medication. 12 tablet 0   aspirin 325 MG EC tablet Take 650 mg by mouth daily as needed for pain.     atorvastatin (LIPITOR) 10 MG tablet Take 1 tablet by mouth daily.     azelastine (OPTIVAR) 0.05 % ophthalmic solution Place 1 drop into both eyes daily as needed (irritation).     Biotin 5000 MCG CAPS Take 1 capsule by mouth daily.  calcium carbonate (OS-CAL) 600 MG TABS tablet Take 600 mg by mouth in the morning and at bedtime.     Cholecalciferol (VITAMIN D) 50 MCG (2000 UT) tablet Take 2,000 Units by mouth at bedtime.     diclofenac Sodium (VOLTAREN) 1 % GEL Apply 2-4 grams to affected joint 4 times daily as needed. 400 g 2   VITAMIN A PO Take 2,400 mcg by mouth daily.     No current facility-administered medications for this visit.    SURGICAL HISTORY:  Past Surgical History:  Procedure Laterality Date   ABDOMINAL  HYSTERECTOMY  1997   APPENDECTOMY  1960   BREAST EXCISIONAL BIOPSY Left    decades ago- "something was removed"   HEMORRHOID SURGERY  01/2010   8/27 had a follow up procedure.     IR THORACENTESIS ASP PLEURAL SPACE W/IMG GUIDE  10/24/2018   LIPOMA EXCISION  1975   neck, left breast and righ jaw.   VIDEO ASSISTED THORACOSCOPY (VATS)/WEDGE RESECTION Right 10/09/2018   Procedure: VIDEO ASSISTED THORACOSCOPY (VATS)/WEDGE RESECTION;  Surgeon: Loreli Slot, MD;  Location: [facility] OR;  Service: Thoracic;  Laterality: Right;   WRIST FRACTURE SURGERY      REVIEW OF SYSTEMS:  A comprehensive review of systems was negative except for: Respiratory: positive for dyspnea on exertion Musculoskeletal: positive for arthralgias   PHYSICAL EXAMINATION: General appearance: alert, cooperative, and no distress Head: Normocephalic, without obvious abnormality, atraumatic Neck: no adenopathy, no JVD, supple, symmetrical, trachea midline, and thyroid not enlarged, symmetric, no tenderness/mass/nodules Lymph nodes: Cervical, supraclavicular, and axillary nodes normal. Resp: clear to auscultation bilaterally Back: symmetric, no curvature. ROM normal. No CVA tenderness. Cardio: regular rate and rhythm, S1, S2 normal, no murmur, click, rub or gallop GI: soft, non-tender; bowel sounds normal; no masses,  no organomegaly Extremities: extremities normal, atraumatic, no cyanosis or edema  ECOG PERFORMANCE STATUS: 1 - Symptomatic but completely ambulatory  Blood pressure (!) 144/74, pulse 86, temperature (!) 97.1 F (36.2 C), temperature source Temporal, resp. rate 16, height 5\' 8"  (1.727 m), weight 233 lb 1.6 oz (105.7 kg), last menstrual period 05/23/1995, SpO2 99%.  LABORATORY DATA: Lab Results  Component Value Date   WBC 7.8 05/09/2023   HGB 13.7 05/09/2023   HCT 39.7 05/09/2023   MCV 89.6 05/09/2023   PLT 298 05/09/2023      Chemistry      Component Value Date/Time   NA 138 05/09/2023 1519   K  3.6 05/09/2023 1519   CL 103 05/09/2023 1519   CO2 27 05/09/2023 1519   BUN 15 05/09/2023 1519   CREATININE 1.01 (H) 05/09/2023 1519   CREATININE 0.98 08/29/2022 1334      Component Value Date/Time   CALCIUM 9.9 05/09/2023 1519   CALCIUM 10.1 12/28/2009 2231   ALKPHOS 63 05/09/2023 1519   AST 53 (H) 05/09/2023 1519   ALT 30 05/09/2023 1519   BILITOT 1.5 (H) 05/09/2023 1519       RADIOGRAPHIC STUDIES: CT Chest W Contrast Result Date: 05/12/2023 CLINICAL DATA:  Non-small lung cancer restaging * Tracking Code: BO * EXAM: CT CHEST WITH CONTRAST TECHNIQUE: Multidetector CT imaging of the chest was performed during intravenous contrast administration. RADIATION DOSE REDUCTION: This exam was performed according to the departmental dose-optimization program which includes automated exposure control, adjustment of the mA and/or kV according to patient size and/or use of iterative reconstruction technique. CONTRAST:  75mL OMNIPAQUE IOHEXOL 300 MG/ML  SOLN COMPARISON:  05/08/2022 FINDINGS: Cardiovascular: Scattered aortic atherosclerosis. Normal heart  size. Left coronary artery calcifications. No pericardial effusion. Mediastinum/Nodes: No enlarged mediastinal, hilar, or axillary lymph nodes. Thyroid gland, trachea, and esophagus demonstrate no significant findings. Lungs/Pleura: Unchanged postoperative findings of right lower lobe wedge resection with associated scarring and small nodules measuring no greater than 0.3 cm (series 6, image 112, 116). No pleural effusion or pneumothorax. Upper Abdomen: No acute abnormality. Unchanged subcentimeter fluid attenuation lesion in the anterior liver dome (series 2, image 118). Colonic diverticulosis. Musculoskeletal: No chest wall abnormality. No acute osseous findings. IMPRESSION: 1. Unchanged postoperative findings of right lower lobe wedge resection with associated scarring and small nodules, most likely benign and postoperative. Continued attention on  follow-up. 2. No evidence of recurrent or metastatic disease in the chest. 3. Unchanged subcentimeter fluid attenuation lesion in the anterior liver dome, almost certainly a benign cysts, requiring no specific further follow-up or characterization. 4. Coronary artery disease. Aortic Atherosclerosis (ICD10-I70.0). Electronically Signed   By: Jearld Lesch M.D.   On: 05/12/2023 12:52    ASSESSMENT AND PLAN: This is a very pleasant 77  years old African-American female with stage IIb (T3, N0, M0) non-small cell lung cancer, adenocarcinoma with multifocal disease in the right lower lobe status post wedge resection x3 of the right lower lobe with lymph node sampling in May 2020. The patient has no actionable mutations and she has negative PDL 1 expression. She underwent 4 cycles of adjuvant systemic chemotherapy with cisplatin and Alimta.  She tolerated her treatment well with no concerning complaints except for fatigue. The patient has been on observation now for more than 4 years and she is feeling fine. The patient had repeat CT scan of the chest performed recently.  I personally and independently reviewed the scan and discussed the result with the patient today. Her scan showed no concerning findings for disease recurrence or metastasis.    Lung Cancer Treated with surgery and chemotherapy (cisplatin and Alimta) in 2020. No recurrence observed. Recent scans and labs are unremarkable. Approaching five years post-treatment. Discussed continuing annual follow-up versus transitioning care to family doctor. Patient prefers to continue annual follow-up for at least another year. - Continue annual follow-up with hematology/oncology - Schedule lab and scan one week before next annual visit  Exertional Dyspnea Reports shortness of breath during exertion, particularly while walking three times a week. No associated chest pain or hemoptysis. Intermittent cough noted. Encouraged continued physical activity. -  Monitor symptoms - Encourage continued physical activity  Coronary Atherosclerosis Incidental finding on scan. No current symptoms reported. Advised diet control and cholesterol management. Refer to cardiologist if symptoms develop. - Follow up with family doctor for management - Advise diet control and cholesterol management - Refer to cardiologist if symptoms develop  General Health Maintenance None - Advise diet control - Treat hypercholesterolemia if present  Follow-up - Schedule next annual visit in December 2025 - Arrange lab and scan one week before the visit.   The patient was advised to call immediately if she has any concerning symptoms in the interval. All questions were answered. The patient knows to call the clinic with any problems, questions or concerns. We can certainly see the patient much sooner if necessary.  Disclaimer: This note was dictated with voice recognition software. Similar sounding words can inadvertently be transcribed and may not be corrected upon review.

## 2023-06-11 ENCOUNTER — Other Ambulatory Visit: Payer: Medicare (Managed Care)

## 2023-06-14 ENCOUNTER — Ambulatory Visit: Payer: Medicare (Managed Care) | Admitting: Internal Medicine

## 2023-06-18 ENCOUNTER — Other Ambulatory Visit: Payer: Self-pay | Admitting: *Deleted

## 2023-06-18 MED ORDER — ALENDRONATE SODIUM 70 MG PO TABS: 70.0000 mg | ORAL_TABLET | ORAL | 0 refills | Status: DC

## 2023-06-18 NOTE — Telephone Encounter (Signed)
Refill request received via fax from [facility] [address]  for Fosamax  Last Fill: 03/30/2023  Labs: 05/09/2023 Creat. 1.01, GFR 58, AST 53, Total Bilirubin 1.5  Next Visit: 08/28/2023  Last Visit: 02/27/2023  DX: Age-related osteoporosis without current pathological fracture   Current Dose per office note 02/27/2023:  Fosamax 70 mg p.o. weekly   Okay to refill Fosamax?

## 2023-08-08 DIAGNOSIS — I83893 Varicose veins of bilateral lower extremities with other complications: Secondary | ICD-10-CM | POA: Diagnosis not present

## 2023-08-08 DIAGNOSIS — E559 Vitamin D deficiency, unspecified: Secondary | ICD-10-CM | POA: Diagnosis not present

## 2023-08-08 DIAGNOSIS — Z1231 Encounter for screening mammogram for malignant neoplasm of breast: Secondary | ICD-10-CM | POA: Diagnosis not present

## 2023-08-08 DIAGNOSIS — R21 Rash and other nonspecific skin eruption: Secondary | ICD-10-CM | POA: Diagnosis not present

## 2023-08-08 DIAGNOSIS — E785 Hyperlipidemia, unspecified: Secondary | ICD-10-CM | POA: Diagnosis not present

## 2023-08-08 DIAGNOSIS — I872 Venous insufficiency (chronic) (peripheral): Secondary | ICD-10-CM | POA: Diagnosis not present

## 2023-08-08 DIAGNOSIS — Q6211 Congenital occlusion of ureteropelvic junction: Secondary | ICD-10-CM | POA: Diagnosis not present

## 2023-08-08 DIAGNOSIS — Z0000 Encounter for general adult medical examination without abnormal findings: Secondary | ICD-10-CM | POA: Diagnosis not present

## 2023-08-08 DIAGNOSIS — M810 Age-related osteoporosis without current pathological fracture: Secondary | ICD-10-CM | POA: Diagnosis not present

## 2023-08-08 DIAGNOSIS — K5730 Diverticulosis of large intestine without perforation or abscess without bleeding: Secondary | ICD-10-CM | POA: Diagnosis not present

## 2023-08-08 DIAGNOSIS — Z90710 Acquired absence of both cervix and uterus: Secondary | ICD-10-CM | POA: Diagnosis not present

## 2023-08-08 DIAGNOSIS — I2510 Atherosclerotic heart disease of native coronary artery without angina pectoris: Secondary | ICD-10-CM | POA: Diagnosis not present

## 2023-08-15 DIAGNOSIS — L300 Nummular dermatitis: Secondary | ICD-10-CM | POA: Diagnosis not present

## 2023-08-15 NOTE — Progress Notes (Deleted)
 Office Visit Note  Patient: Stephanie Crawford             Date of Birth: 1945-07-19           MRN: [id number]             PCP: Lorenda Ishihara, MD Referring: Lorenda Ishihara,* Visit Date: 08/28/2023 Occupation: @GUAROCC @  Subjective:  No chief complaint on file.   History of Present Illness: Stephanie Crawford is a 78 y.o. female ***     Activities of Daily Living:  Patient reports morning stiffness for *** {minute/hour:19697}.   Patient {ACTIONS;DENIES/REPORTS:21021675::"Denies"} nocturnal pain.  Difficulty dressing/grooming: {ACTIONS;DENIES/REPORTS:21021675::"Denies"} Difficulty climbing stairs: {ACTIONS;DENIES/REPORTS:21021675::"Denies"} Difficulty getting out of chair: {ACTIONS;DENIES/REPORTS:21021675::"Denies"} Difficulty using hands for taps, buttons, cutlery, and/or writing: {ACTIONS;DENIES/REPORTS:21021675::"Denies"}  No Rheumatology ROS completed.   PMFS History:  Patient Active Problem List   Diagnosis Date Noted   Dyspnea 12/19/2022   Hypertension 02/24/2019   Goals of care, counseling/discussion 11/14/2018   Adenocarcinoma of right lung, stage 2 (HCC) 10/25/2018   Encounter for antineoplastic chemotherapy 10/25/2018   S/P partial lobectomy of lung 10/09/2018   Primary osteoarthritis of both knees 05/17/2017   Primary osteoarthritis of both hands 05/17/2017   DDD (degenerative disc disease), lumbar 05/17/2017   History of gastroesophageal reflux (GERD) 05/17/2017   History of shingles 05/17/2017   History of cellulitis 05/17/2017   Multiple lung nodules on CT 11/30/2016   Cough 11/30/2016   Right knee pain 04/28/2015   Foot ulcer (HCC) 07/15/2014   Osteoporosis 01/30/2013   Vitamin D deficiency 01/13/2013   Other and unspecified hyperlipidemia 01/06/2013   Breast thickening 01/06/2013   Skin lesion of right leg 09/19/2012   Chronic seasonal allergic rhinitis 09/19/2012   Elevated bilirubin 07/12/2012   History of cardiac murmur  07/12/2012   Rash and nonspecific skin eruption 07/11/2012   Rectal bleeding 05/11/2011   Abnormal EKG 03/01/2011   General medical examination 03/01/2011   GERD 12/08/2009   ANKLE EDEMA 12/08/2009    Past Medical History:  Diagnosis Date   Allergic rhinitis    Allergy    Anemia    prior to hysterectomy--1997   Arthritis    Dyspnea 12/19/2022   GERD (gastroesophageal reflux disease)    Heart murmur    Hemorrhoid 2011   History of shingles 04/2014   Kidney stones    Lung cancer (HCC)    Nodule of lower lobe of right lung    Osteoporosis    Varicose veins of left lower extremity     Family History  Problem Relation Age of Onset   Cancer Father    Prostate cancer Father    Asthma Sister    Heart attack Brother    Prostate cancer Brother    Lung cancer Maternal Grandmother    Asthma Daughter    Colon cancer Neg Hx    Esophageal cancer Neg Hx    Pancreatic cancer Neg Hx    Stomach cancer Neg Hx    Liver disease Neg Hx    Rectal cancer Neg Hx    Breast cancer Neg Hx    Past Surgical History:  Procedure Laterality Date   ABDOMINAL HYSTERECTOMY  1997   APPENDECTOMY  1960   BREAST EXCISIONAL BIOPSY Left    decades ago- "something was removed"   HEMORRHOID SURGERY  01/2010   8/27 had a follow up procedure.     IR THORACENTESIS ASP PLEURAL SPACE W/IMG GUIDE  10/24/2018   LIPOMA EXCISION  1975  neck, left breast and righ jaw.   VIDEO ASSISTED THORACOSCOPY (VATS)/WEDGE RESECTION Right 10/09/2018   Procedure: VIDEO ASSISTED THORACOSCOPY (VATS)/WEDGE RESECTION;  Surgeon: Loreli Slot, MD;  Location: [facility] OR;  Service: Thoracic;  Laterality: Right;   WRIST FRACTURE SURGERY     Social History   Social History Narrative   [facility] (11/30/16):   Originally from [place], [place]. Grew up primarily in [place], [place]. She has also lived in [place]. She moved to [facility] in 2006. 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 Recently has acquired an outside dog. No bird exposure. Possible mold problem in her current home with history of water damage. No hot tub exposure. Enjoys babysitting her grandsons. Remote travel to [place], [place], [place], [place], [place], [place], [place], [place], [place], & [place]    Immunization History  Administered Date(s) Administered   PFIZER(Purple Top)SARS-COV-2 Vaccination 07/17/2019, 08/13/2019, 04/03/2020   PNEUMOCOCCAL CONJUGATE-20 10/31/2022   Pfizer Covid-19 Vaccine Bivalent Booster 97yrs & up 05/18/2021   Pfizer(Comirnaty)Fall Seasonal Vaccine 12 years and older 04/20/2022   Td 06/22/2009   Tdap 01/06/2013     Objective: Vital Signs: LMP 05/23/1995    Physical Exam   Musculoskeletal Exam: ***  CDAI Exam: CDAI Score: -- Patient Global: --; Provider Global: -- Swollen: --; Tender: -- Joint Exam 08/28/2023   No joint exam has been documented for this visit   There is currently no information documented on the homunculus. Go to the Rheumatology activity and complete the homunculus joint exam.  Investigation: No additional findings.  Imaging: No results found.  Recent Labs: Lab Results  Component Value Date   WBC 7.8 05/09/2023   HGB 13.7 05/09/2023   PLT 298 05/09/2023   NA 138 05/09/2023   K 3.6 05/09/2023   CL 103 05/09/2023   CO2 27 05/09/2023   GLUCOSE 92 05/09/2023   BUN 15 05/09/2023   CREATININE 1.01 (H) 05/09/2023   BILITOT 1.5 (H) 05/09/2023   ALKPHOS 63 05/09/2023   AST 53 (H) 05/09/2023   ALT 30 05/09/2023   PROT 7.0 05/09/2023   ALBUMIN 4.3 05/09/2023   CALCIUM 9.9 05/09/2023   GFRAA 55 (L) 10/21/2019    Speciality Comments: Fosamax 04/22  Procedures:  No procedures performed Allergies: Adhesive [tape]   Assessment / Plan:     Visit Diagnoses: No diagnosis found.  Orders: No orders of the defined types were placed in this encounter.  No  orders of the defined types were placed in this encounter.   Face-to-face time spent with patient was *** minutes. Greater than 50% of time was spent in counseling and coordination of care.  Follow-Up Instructions: No follow-ups on file.   Ellen Henri, CMA  Note - This record has been created using Animal nutritionist.  Chart creation errors have been sought, but may not always  have been located. Such creation errors do not reflect on  the standard of medical care.

## 2023-08-28 ENCOUNTER — Ambulatory Visit: Payer: Medicare (Managed Care) | Admitting: Rheumatology

## 2023-08-28 DIAGNOSIS — M51369 Other intervertebral disc degeneration, lumbar region without mention of lumbar back pain or lower extremity pain: Secondary | ICD-10-CM

## 2023-08-28 DIAGNOSIS — M170 Bilateral primary osteoarthritis of knee: Secondary | ICD-10-CM

## 2023-08-28 DIAGNOSIS — Z8639 Personal history of other endocrine, nutritional and metabolic disease: Secondary | ICD-10-CM

## 2023-08-28 DIAGNOSIS — M19041 Primary osteoarthritis, right hand: Secondary | ICD-10-CM

## 2023-08-28 DIAGNOSIS — M810 Age-related osteoporosis without current pathological fracture: Secondary | ICD-10-CM

## 2023-08-28 DIAGNOSIS — Z8679 Personal history of other diseases of the circulatory system: Secondary | ICD-10-CM

## 2023-08-28 DIAGNOSIS — C3491 Malignant neoplasm of unspecified part of right bronchus or lung: Secondary | ICD-10-CM

## 2023-08-28 DIAGNOSIS — Z902 Acquired absence of lung [part of]: Secondary | ICD-10-CM

## 2023-08-28 DIAGNOSIS — E785 Hyperlipidemia, unspecified: Secondary | ICD-10-CM

## 2023-08-28 DIAGNOSIS — Z8619 Personal history of other infectious and parasitic diseases: Secondary | ICD-10-CM

## 2023-08-28 DIAGNOSIS — M7061 Trochanteric bursitis, right hip: Secondary | ICD-10-CM

## 2023-08-28 DIAGNOSIS — Z5181 Encounter for therapeutic drug level monitoring: Secondary | ICD-10-CM

## 2023-08-28 DIAGNOSIS — Z8719 Personal history of other diseases of the digestive system: Secondary | ICD-10-CM

## 2023-08-28 DIAGNOSIS — Z872 Personal history of diseases of the skin and subcutaneous tissue: Secondary | ICD-10-CM

## 2023-08-28 DIAGNOSIS — M65331 Trigger finger, right middle finger: Secondary | ICD-10-CM

## 2023-09-10 NOTE — Progress Notes (Signed)
 Office Visit Note  Patient: Stephanie Crawford             Date of Birth: July 28, 1945           MRN: [id number]             PCP: Arva Lathe, MD Referring: Arva Lathe,* Visit Date: 09/19/2023 Occupation: @GUAROCC @  Subjective:  Medication management  History of Present Illness: Stephanie Crawford is a 78 y.o. female with osteoarthritis, degenerative disc disease and osteoporosis.  She returns today after her last visit in October 2024.  She had been taking Fosamax  since April 2022.  She states she has been taking it on a weekly basis without any interruption.  She also takes calcium  and vitamin D .  She states she walks 3 times a week and also has been practicing tai chi.  There is no history of falls or any fractures.  She continues to have some stiffness in her hands due to underlying osteoarthritis.  She has difficulty making a fist.  She has not had any recent problems with trigger finger.  She has not had much problems with trochanteric bursitis or osteoarthritis in her knees.  She states she has been having some stiffness in her bilateral first MTP joints.  The right first MTP joint is more the stiff.  She has neuropathy in her feet.  She denies discomfort in her lower back.    Activities of Daily Living:  Patient reports morning stiffness for 15-20 minutes.   Patient Reports nocturnal pain.  Difficulty dressing/grooming: Denies Difficulty climbing stairs: Denies Difficulty getting out of chair: Denies Difficulty using hands for taps, buttons, cutlery, and/or writing: Denies  Review of Systems  Constitutional:  Positive for fatigue.  HENT:  Positive for mouth dryness. Negative for mouth sores.   Eyes:  Negative for dryness.  Respiratory:  Positive for shortness of breath.   Cardiovascular:  Negative for chest pain and palpitations.  Gastrointestinal:  Negative for blood in stool, constipation and diarrhea.  Endocrine: Negative for increased urination.   Genitourinary:  Negative for involuntary urination.  Musculoskeletal:  Positive for joint pain, gait problem, joint pain, morning stiffness and muscle tenderness. Negative for joint swelling, myalgias, muscle weakness and myalgias.  Skin:  Positive for rash. Negative for color change, hair loss and sensitivity to sunlight.  Allergic/Immunologic: Positive for susceptible to infections.  Neurological:  Negative for dizziness and headaches.  Hematological:  Negative for swollen glands.  Psychiatric/Behavioral:  Negative for depressed mood and sleep disturbance. The patient is not nervous/anxious.     PMFS History:  Patient Active Problem List   Diagnosis Date Noted   Dyspnea 12/19/2022   Hypertension 02/24/2019   Goals of care, counseling/discussion 11/14/2018   Adenocarcinoma of right lung, stage 2 (HCC) 10/25/2018   Encounter for antineoplastic chemotherapy 10/25/2018   S/P partial lobectomy of lung 10/09/2018   Primary osteoarthritis of both knees 05/17/2017   Primary osteoarthritis of both hands 05/17/2017   DDD (degenerative disc disease), lumbar 05/17/2017   History of gastroesophageal reflux (GERD) 05/17/2017   History of shingles 05/17/2017   History of cellulitis 05/17/2017   Multiple lung nodules on CT 11/30/2016   Cough 11/30/2016   Right knee pain 04/28/2015   Foot ulcer (HCC) 07/15/2014   Osteoporosis 01/30/2013   Vitamin D  deficiency 01/13/2013   Other and unspecified hyperlipidemia 01/06/2013   Breast thickening 01/06/2013   Skin lesion of right leg 09/19/2012   Chronic seasonal allergic rhinitis 09/19/2012   Elevated  bilirubin 07/12/2012   History of cardiac murmur 07/12/2012   Rash and nonspecific skin eruption 07/11/2012   Rectal bleeding 05/11/2011   Abnormal EKG 03/01/2011   General medical examination 03/01/2011   GERD 12/08/2009   ANKLE EDEMA 12/08/2009    Past Medical History:  Diagnosis Date   Allergic rhinitis    Allergy     Anemia    prior to  hysterectomy--1997   Arthritis    Dyspnea 12/19/2022   GERD (gastroesophageal reflux disease)    Heart murmur    Hemorrhoid 2011   History of shingles 04/2014   Kidney stones    Lung cancer (HCC)    Nodule of lower lobe of right lung    Osteoporosis    Varicose veins of left lower extremity     Family History  Problem Relation Age of Onset   Cancer Father    Prostate cancer Father    Asthma Sister    Heart attack Brother    Prostate cancer Brother    Lung cancer Maternal Grandmother    Asthma Daughter    Colon cancer Neg Hx    Esophageal cancer Neg Hx    Pancreatic cancer Neg Hx    Stomach cancer Neg Hx    Liver disease Neg Hx    Rectal cancer Neg Hx    Breast cancer Neg Hx    Past Surgical History:  Procedure Laterality Date   ABDOMINAL HYSTERECTOMY  1997   APPENDECTOMY  1960   BREAST EXCISIONAL BIOPSY Left    decades ago- "something was removed"   HEMORRHOID SURGERY  01/2010   8/27 had a follow up procedure.     IR THORACENTESIS ASP PLEURAL SPACE W/IMG GUIDE  10/24/2018   LIPOMA EXCISION  1975   neck, left breast and righ jaw.   VIDEO ASSISTED THORACOSCOPY (VATS)/WEDGE RESECTION Right 10/09/2018   Procedure: VIDEO ASSISTED THORACOSCOPY (VATS)/WEDGE RESECTION;  Surgeon: Zelphia Higashi, MD;  Location: [facility] OR;  Service: Thoracic;  Laterality: Right;   WRIST FRACTURE SURGERY     Social History   Social History Narrative   [facility] (11/30/16):   Originally from [place], [place]. Grew up primarily in [place], [place]. She has also lived in [place] . She moved to [facility] in 2006. 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 Recently has acquired an outside dog. No bird exposure. Possible mold problem in her current home with history of water damage. No hot tub exposure. Enjoys babysitting her grandsons. Remote travel to [place], [place], [place], [place], [place], [place], [place], [place], [place], & [place]    Immunization History  Administered Date(s) Administered   PFIZER(Purple Top)SARS-COV-2 Vaccination 07/17/2019, 08/13/2019, 04/03/2020   PNEUMOCOCCAL CONJUGATE-20 10/31/2022   Pfizer Covid-19 Vaccine Bivalent Booster 50yrs & up 05/18/2021   Pfizer(Comirnaty )Fall Seasonal Vaccine 12 years and older 04/20/2022   Td 06/22/2009   Tdap 01/06/2013     Objective: Vital Signs: BP 112/73 (BP Location: Left Arm, Patient Position: Sitting, Cuff Size: Large)   Pulse 76   Resp 14   Ht 5\' 8"  (1.727 m)   Wt 223 lb (101.2 kg)   LMP 05/23/1995   BMI 33.91 kg/m    Physical Exam Vitals and nursing note reviewed.  Constitutional:      Appearance: She is well-developed.  HENT:     Head: Normocephalic and atraumatic.  Eyes:     Conjunctiva/sclera: Conjunctivae normal.  Cardiovascular:     Rate and Rhythm: Normal rate and regular rhythm.     Heart sounds: Normal heart sounds.  Pulmonary:     Effort: Pulmonary effort is normal.     Breath sounds: Normal breath sounds.  Abdominal:     General: Bowel sounds are normal.     Palpations: Abdomen is soft.  Musculoskeletal:     Cervical back: Normal range of motion.  Lymphadenopathy:     Cervical: No cervical adenopathy.  Skin:    General: Skin is warm and dry.     Capillary Refill: Capillary refill takes less than 2 seconds.  Neurological:     Mental Status: She is alert and oriented to person, place, and time.  Psychiatric:        Behavior: Behavior normal.      Musculoskeletal Exam: Cervical, thoracic and lumbar spine were in good range of motion.  She had no tenderness over thoracic or lumbar spine.  Shoulders, elbows, wrist joints with good range of motion.  She had bilateral CMC, PIP and DIP thickening with no synovitis.  She had tenderness over bilateral trochanteric bursa.  Hip joints with good range of motion.  Knee joints with good range of motion with limited  extension of the right knee joint without any warmth swelling or effusion.  There was no tenderness over ankles.  She has thickening of the bilateral first MTP joints.  CDAI Exam: CDAI Score: -- Patient Global: --; Provider Global: -- Swollen: --; Tender: -- Joint Exam 09/19/2023   No joint exam has been documented for this visit   There is currently no information documented on the homunculus. Go to the Rheumatology activity and complete the homunculus joint exam.  Investigation: No additional findings.  Imaging: No results found.  Recent Labs: Lab Results  Component Value Date   WBC 7.8 05/09/2023   HGB 13.7 05/09/2023   PLT 298 05/09/2023   NA 138 05/09/2023   K 3.6 05/09/2023   CL 103 05/09/2023   CO2 27 05/09/2023   GLUCOSE 92 05/09/2023   BUN 15 05/09/2023   CREATININE 1.01 (H) 05/09/2023   BILITOT 1.5 (H) 05/09/2023   ALKPHOS 63 05/09/2023   AST 53 (H) 05/09/2023   ALT 30 05/09/2023   PROT 7.0 05/09/2023   ALBUMIN 4.3 05/09/2023   CALCIUM  9.9 05/09/2023   GFRAA 55 (L) 10/21/2019   04/18/22 The BMD measured at Femur Neck Right is 0.635 g/cm2 with a T-score of -2.9.  07/28/21The BMD measured at Femur Total Left is 0.647 g/cm2 with a T-score of -2.9.   Speciality Comments: Fosamax  04/22  Procedures:  No procedures performed Allergies: Adhesive [tape]   Assessment / Plan:     Visit Diagnoses: Age-related osteoporosis without current pathological fracture - April 18, 2022 the BMD measured at Femur Neck Right is 0.635 g/cm2 with a T-score of -2.9. 07/28/21The BMD measured at Femur Total Left is 0.647 g/cm2 with a T-score of -2.9.  Patient has been on Fosamax  since April 2022.  I advised her to discontinue Fosamax .  She will go on a drug holiday for at least 2 years.  I advised her to get repeat DEXA scan through her PCP after April 18, 2024.  She will continue calcium  and vitamin D .  She has been walking 3 times a week and also practices tai chi.  Resistive  exercises were discussed.  History of vitamin D  deficiency-she takes vitamin D  supplement.  Medication monitoring encounter - previously treated with Boniva   and Fosamax  16 years ago.  December 17, 2019 left femoral neck BMD 0.647, T score -2.9. Fosamax  70 mg po q week started 08/2020, discontinued today.  Primary osteoarthritis of both hands-she has osteoarthritis in bilateral hands with CMC PIP and DIP thickening.  She continues to have a stiffness.  A handout on hand exercises was given.  Trigger finger, right middle finger-she has intermittent symptoms.  Trochanteric bursitis of both hips-she continues to have some tenderness over trochanteric bursa.  IT band stretches were demonstrated in the office and a handout on exercises was given.  Primary osteoarthritis of both knees - moderate OA and moderate chondromalacia patella.  Degeneration of intervertebral disc of lumbar region without discogenic back pain or lower extremity pain-she denies any discomfort today.  Other medical problems listed as follows:  Adenocarcinoma of right lung, stage 2 (HCC) - diagnosed with adenocarcinoma stage 2.  She had a partial lobectomy of the right lung and s/p CTX.  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  No orders of the defined types were placed in this encounter.    Follow-Up Instructions: Return in about 8 months (around 05/20/2024) for Osteoporosis, Osteoarthritis.   Nicholas Bari, MD  Note - This record has been created using Animal nutritionist.  Chart creation errors have been sought, but may not always  have been located. Such creation errors do not reflect on  the standard of medical care.

## 2023-09-19 ENCOUNTER — Encounter: Payer: Self-pay | Admitting: Rheumatology

## 2023-09-19 ENCOUNTER — Ambulatory Visit: Payer: Medicare (Managed Care) | Attending: Rheumatology | Admitting: Rheumatology

## 2023-09-19 VITALS — BP 112/73 | HR 76 | Resp 14 | Ht 68.0 in | Wt 223.0 lb

## 2023-09-19 DIAGNOSIS — M810 Age-related osteoporosis without current pathological fracture: Secondary | ICD-10-CM

## 2023-09-19 DIAGNOSIS — Z8619 Personal history of other infectious and parasitic diseases: Secondary | ICD-10-CM

## 2023-09-19 DIAGNOSIS — M51369 Other intervertebral disc degeneration, lumbar region without mention of lumbar back pain or lower extremity pain: Secondary | ICD-10-CM

## 2023-09-19 DIAGNOSIS — M7061 Trochanteric bursitis, right hip: Secondary | ICD-10-CM | POA: Diagnosis not present

## 2023-09-19 DIAGNOSIS — M19041 Primary osteoarthritis, right hand: Secondary | ICD-10-CM

## 2023-09-19 DIAGNOSIS — C3491 Malignant neoplasm of unspecified part of right bronchus or lung: Secondary | ICD-10-CM | POA: Diagnosis not present

## 2023-09-19 DIAGNOSIS — Z5181 Encounter for therapeutic drug level monitoring: Secondary | ICD-10-CM

## 2023-09-19 DIAGNOSIS — Z902 Acquired absence of lung [part of]: Secondary | ICD-10-CM

## 2023-09-19 DIAGNOSIS — G629 Polyneuropathy, unspecified: Secondary | ICD-10-CM

## 2023-09-19 DIAGNOSIS — M65331 Trigger finger, right middle finger: Secondary | ICD-10-CM | POA: Diagnosis not present

## 2023-09-19 DIAGNOSIS — Z872 Personal history of diseases of the skin and subcutaneous tissue: Secondary | ICD-10-CM

## 2023-09-19 DIAGNOSIS — M170 Bilateral primary osteoarthritis of knee: Secondary | ICD-10-CM

## 2023-09-19 DIAGNOSIS — Z8639 Personal history of other endocrine, nutritional and metabolic disease: Secondary | ICD-10-CM | POA: Diagnosis not present

## 2023-09-19 DIAGNOSIS — E785 Hyperlipidemia, unspecified: Secondary | ICD-10-CM

## 2023-09-19 DIAGNOSIS — M7062 Trochanteric bursitis, left hip: Secondary | ICD-10-CM

## 2023-09-19 DIAGNOSIS — Z8679 Personal history of other diseases of the circulatory system: Secondary | ICD-10-CM

## 2023-09-19 DIAGNOSIS — M19042 Primary osteoarthritis, left hand: Secondary | ICD-10-CM

## 2023-09-19 DIAGNOSIS — Z8719 Personal history of other diseases of the digestive system: Secondary | ICD-10-CM

## 2023-09-19 NOTE — Patient Instructions (Addendum)
 Please request your PCP to order repeat DEXA scan after April 18, 2024.  Discontinue Fosamax   Hand Exercises Hand exercises can be helpful for almost anyone. They can strengthen your hands and improve flexibility and movement. The exercises can also increase blood flow to the hands. These results can make your work and daily tasks easier for you. Hand exercises can be especially helpful for people who have joint pain from arthritis or nerve damage from using their hands over and over. These exercises can also help people who injure a hand. Exercises Most of these hand exercises are gentle stretching and motion exercises. It is usually safe to do them often throughout the day. Warming up your hands before exercise may help reduce stiffness. You can do this with gentle massage or by placing your hands in warm water for 10-15 minutes. It is normal to feel some stretching, pulling, tightness, or mild discomfort when you begin new exercises. In time, this will improve. Remember to always be careful and stop right away if you feel sudden, very bad pain or your pain gets worse. You want to get better and be safe. Ask your health care provider which exercises are safe for you. Do exercises exactly as told by your provider and adjust them as told. Do not begin these exercises until told by your provider. Knuckle bend or "claw" fist  Stand or sit with your arm, hand, and all five fingers pointed straight up. Make sure to keep your wrist straight. Gently bend your fingers down toward your palm until the tips of your fingers are touching your palm. Keep your big knuckle straight and only bend the small knuckles in your fingers. Hold this position for 10 seconds. Straighten your fingers back to your starting position. Repeat this exercise 5-10 times with each hand. Full finger fist  Stand or sit with your arm, hand, and all five fingers pointed straight up. Make sure to keep your wrist straight. Gently  bend your fingers into your palm until the tips of your fingers are touching the middle of your palm. Hold this position for 10 seconds. Extend your fingers back to your starting position, stretching every joint fully. Repeat this exercise 5-10 times with each hand. Straight fist  Stand or sit with your arm, hand, and all five fingers pointed straight up. Make sure to keep your wrist straight. Gently bend your fingers at the big knuckle, where your fingers meet your hand, and at the middle knuckle. Keep the knuckle at the tips of your fingers straight and try to touch the bottom of your palm. Hold this position for 10 seconds. Extend your fingers back to your starting position, stretching every joint fully. Repeat this exercise 5-10 times with each hand. Tabletop  Stand or sit with your arm, hand, and all five fingers pointed straight up. Make sure to keep your wrist straight. Gently bend your fingers at the big knuckle, where your fingers meet your hand, as far down as you can. Keep the small knuckles in your fingers straight. Think of forming a tabletop with your fingers. Hold this position for 10 seconds. Extend your fingers back to your starting position, stretching every joint fully. Repeat this exercise 5-10 times with each hand. Finger spread  Place your hand flat on a table with your palm facing down. Make sure your wrist stays straight. Spread your fingers and thumb apart from each other as far as you can until you feel a gentle stretch. Hold this position for  10 seconds. Bring your fingers and thumb tight together again. Hold this position for 10 seconds. Repeat this exercise 5-10 times with each hand. Making circles  Stand or sit with your arm, hand, and all five fingers pointed straight up. Make sure to keep your wrist straight. Make a circle by touching the tip of your thumb to the tip of your index finger. Hold for 10 seconds. Then open your hand wide. Repeat this motion  with your thumb and each of your fingers. Repeat this exercise 5-10 times with each hand. Thumb motion  Sit with your forearm resting on a table and your wrist straight. Your thumb should be facing up toward the ceiling. Keep your fingers relaxed as you move your thumb. Lift your thumb up as high as you can toward the ceiling. Hold for 10 seconds. Bend your thumb across your palm as far as you can, reaching the tip of your thumb for the small finger (pinkie) side of your palm. Hold for 10 seconds. Repeat this exercise 5-10 times with each hand. Grip strengthening  Hold a stress ball or other soft ball in the middle of your hand. Slowly increase the pressure, squeezing the ball as much as you can without causing pain. Think of bringing the tips of your fingers into the middle of your palm. All of your finger joints should bend when doing this exercise. Hold your squeeze for 10 seconds, then relax. Repeat this exercise 5-10 times with each hand. Contact a health care provider if: Your hand pain or discomfort gets much worse when you do an exercise. Your hand pain or discomfort does not improve within 2 hours after you exercise. If you have either of these problems, stop doing these exercises right away. Do not do them again unless your provider says that you can. Get help right away if: You develop sudden, severe hand pain or swelling. If this happens, stop doing these exercises right away. Do not do them again unless your provider says that you can. This information is not intended to replace advice given to you by your health care provider. Make sure you discuss any questions you have with your health care provider. Document Revised: 05/23/2022 Document Reviewed: 05/23/2022 Elsevier Patient Education  2024 [employer]  Iliotibial Band Syndrome Rehab Ask your health care provider which exercises are safe for you. Do exercises exactly as told by your provider and adjust them as told. It's  normal to feel mild stretching, pulling, tightness, or discomfort as you do these exercises. Stop right away if you feel sudden pain or your pain gets a lot worse. Do not begin these exercises until told by your provider. Stretching and range-of-motion exercises These exercises warm up your muscles and joints. They also improve the movement and flexibility of your hip and pelvis. Quadriceps stretch, prone  Lie face down (prone) on a firm surface like a bed or padded floor. Bend your left / right knee. Reach back to hold your ankle or pant leg. If you can't reach your ankle or pant leg, use a belt looped around your foot and grab the belt instead. Gently pull your heel toward your butt. Your knee should not slide out to the side. You should feel a stretch in the front of your thigh and knee, also called the quadriceps. Hold this position for __________ seconds. Repeat __________ times. Complete this exercise __________ times a day. Iliotibial band stretch The iliotibial band is a strip of tissue that runs along the  outside of your hip down to your knee. Lie on your side with your left / right leg on top. Bend both knees and grab your left / right ankle. Stretch out your bottom arm to help you balance. Slowly bring your top knee back so your thigh goes behind your back. Slowly lower your top leg toward the floor until you feel a gentle stretch on the outside of your left / right hip and thigh. If you don't feel a stretch and your knee won't go farther, place the heel of your other foot on top of your knee and pull your knee down toward the floor with your foot. Hold this position for __________ seconds. Repeat __________ times. Complete this exercise __________ times a day. Strengthening exercises These exercises build strength and endurance in your hip and pelvis. Endurance means your muscles can keep working even when they're tired. Straight leg raises, side-lying This exercise strengthens the  muscles that rotate the leg at the hip and move it away from your body. These muscles are called hip abductors. Lie on your side with your left / right leg on top. Lie so your head, shoulder, hip, and knee line up. You can bend your bottom knee to help you balance. Roll your hips slightly forward so they're stacked directly over each other. Your left / right knee should face forward. Tense the muscles in your outer thigh and hip. Lift your top leg 4-6 inches (10-15 cm) off the ground. Hold this position for __________ seconds. Slowly lower your leg back down to the starting position. Let your muscles fully relax before doing this exercise again. Repeat __________ times. Complete this exercise __________ times a day. Leg raises, prone This exercise strengthens the muscles that move the hips backward. These muscles are called hip extensors. Lie face down (prone) on your bed or a firm surface. You can put a pillow under your hips for comfort and to support your lower back. Bend your left / right knee so your foot points straight up toward the ceiling. Keep the other leg straight and behind you. Squeeze your butt muscles. Lift your left / right thigh off the firm surface. Do not let your back arch. Tense your thigh muscle as hard as you can without having more knee pain. Hold this position for __________ seconds. Slowly lower your leg to the starting position. Allow your leg to relax all the way. Repeat __________ times. Complete this exercise __________ times a day. Hip hike  Stand sideways on a bottom step. Place your feet so that your left / right leg is on the step, and the other foot is hanging off the side. If you need support for balance, hold onto a railing or wall. Keep your knees straight and your abdomen square, meaning your hips are level. Then, lift your left / right hip up toward the ceiling. Slowly let your leg that's hanging off the step lower towards the floor. Your foot should get  closer to the ground. Do not lean or bend your knees during this movement. Repeat __________ times. Complete this exercise __________ times a day. This information is not intended to replace advice given to you by your health care provider. Make sure you discuss any questions you have with your health care provider. Document Revised: 07/21/2022 Document Reviewed: 07/21/2022 Elsevier Patient Education  2024 [employer].

## 2023-11-26 ENCOUNTER — Other Ambulatory Visit: Payer: Self-pay | Admitting: Internal Medicine

## 2023-11-26 DIAGNOSIS — Z1231 Encounter for screening mammogram for malignant neoplasm of breast: Secondary | ICD-10-CM

## 2024-01-10 ENCOUNTER — Ambulatory Visit
Admission: RE | Admit: 2024-01-10 | Discharge: 2024-01-10 | Disposition: A | Discharge status: Home / Self Care | Payer: Medicare (Managed Care) | Source: Ambulatory Visit | Attending: Internal Medicine | Admitting: Internal Medicine

## 2024-01-10 DIAGNOSIS — Z1231 Encounter for screening mammogram for malignant neoplasm of breast: Secondary | ICD-10-CM | POA: Diagnosis not present

## 2024-01-22 ENCOUNTER — Encounter (INDEPENDENT_AMBULATORY_CARE_PROVIDER_SITE_OTHER): Payer: Self-pay | Admitting: Otolaryngology

## 2024-01-22 ENCOUNTER — Ambulatory Visit (INDEPENDENT_AMBULATORY_CARE_PROVIDER_SITE_OTHER): Payer: Medicare (Managed Care) | Admitting: Otolaryngology

## 2024-01-22 VITALS — BP 135/82 | HR 84

## 2024-01-22 DIAGNOSIS — R0981 Nasal congestion: Secondary | ICD-10-CM | POA: Diagnosis not present

## 2024-01-22 DIAGNOSIS — H6123 Impacted cerumen, bilateral: Secondary | ICD-10-CM | POA: Diagnosis not present

## 2024-01-22 DIAGNOSIS — H903 Sensorineural hearing loss, bilateral: Secondary | ICD-10-CM | POA: Diagnosis not present

## 2024-01-22 DIAGNOSIS — R0982 Postnasal drip: Secondary | ICD-10-CM

## 2024-01-22 DIAGNOSIS — J342 Deviated nasal septum: Secondary | ICD-10-CM

## 2024-01-22 DIAGNOSIS — J310 Chronic rhinitis: Secondary | ICD-10-CM

## 2024-01-22 DIAGNOSIS — J343 Hypertrophy of nasal turbinates: Secondary | ICD-10-CM

## 2024-01-22 NOTE — Progress Notes (Unsigned)
 Patient ID: Stephanie Crawford, female   DOB: [birth year], 78 y.o.   MRN: [id number]  Follow-up: Chronic nasal congestion, nasal drainage, hearing loss  HPI: 8/24 The patient is a 78 year old female who returns today for her follow-up evaluation.  The patient was previously seen for bilateral hearing loss, nasal drainage, and chronic nasal congestion.  At her last visit 1 year ago, she was noted to have bilateral cerumen impaction, nasal septal deviation, and bilateral inferior turbinate hypertrophy.  She was treated with Flonase and Atrovent nasal sprays.  The patient returns today complaining of persistent postnasal drainage.  She also complains of increasing clogging sensation in her ears.  Her hearing is muffled.  She denies any otalgia, otorrhea, facial pain, or fever.   Objective Objective note General: Communicates without difficulty, well nourished, no acute distress. Head: Normocephalic, no evidence injury, no tenderness, facial buttresses intact without stepoff. Eyes: PERRL, EOMI. No scleral icterus, conjunctivae clear. Neuro: CN II exam reveals vision grossly intact.  No nystagmus at any point of gaze. EAC: Bilateral cerumen impaction.  Under the operating microscope, the cerumen is carefully removed with a combination of cerumen currette, alligator forceps, and suction catheters.  After the cerumen is removed, the TMs are noted to be normal.  No mass, erythema, or lesions. Nose: External evaluation reveals normal support and skin without lesions.  Dorsum is intact.  Anterior rhinoscopy reveals congested and edematous mucosa over anterior aspect of the inferior turbinates and nasal septum.  No purulence is noted. Middle meatus is not well visualized. Oral:  Oral cavity and oropharynx are intact, symmetric, without erythema or edema.  Mucosa is moist without lesions. Neck: Full range of motion without pain.  There is no significant lymphadenopathy.  No masses palpable.  Thyroid  bed within normal  limits to palpation.  Parotid glands and submandibular glands equal bilaterally without mass.  Trachea is midline. Neuro:  CN 2-12 grossly intact. Gait normal. Vestibular: No nystagmus at any point of gaze.     Procedure: Flexible Nasal Endoscopy Description: Risks, benefits, and alternatives of flexible endoscopy were explained to the patient. Specific mention was made of the risk of throat numbness with difficulty swallowing, possible bleeding from the nose and mouth, and pain from the procedure. The patient gave oral consent to proceed.  The flexible scope was inserted into the right nasal cavity. Endoscopy of the interior nasal cavity, superior, inferior, and middle meatus was performed. The sphenoid-ethmoid recess was examined. Edematous mucosa was noted. No polyp, mass, or lesion was appreciated. Olfactory cleft was clear. Nasopharynx was clear. Turbinates were hypertrophied but without mass.  The procedure was repeated on the contralateral side with similar findings. The patient tolerated the procedure well.     Procedure: Bilateral cerumen disimpaction Anesthesia: None Description: Under the operating microscope, the cerumen is carefully removed with a combination of cerumen currette, alligator forceps, and suction catheters.  After the cerumen is removed, the TMs are noted to be normal.  No mass, erythema, or lesions. The patient tolerated the procedure well.    Observations Functional status No functional status recorded  Cognitive status No cognitive status recorded  Assessment Assessment note 1.  Bilateral cerumen impaction.  After the disimpaction procedure, the patient's ear canals, tympanic membranes and middle ear spaces are noted to be normal.   2.  Chronic rhinitis with nasal mucosal congestion, nasal septal deviation, and bilateral inferior turbinate hypertrophy.  The patient also has chronic postnasal drainage.  5.  History of bilateral sensorineural hearing loss.  The  patient defers on a hearing test today.   Screenings/Interventions/Assessments No screenings/interventions/assessments recorded  Diagnoses attached to encounter No diagnoses attached  Plan Plan note 1. Otomicroscopy with bilateral cerumen disimpaction.   2. The physical exam, and nasal endoscopy findings are reviewed with the patient.  The hearing test results also reviewed.  3. Continue with Flonase and Atrovent nasal sprays.   4. The patient will return for reevaluation in 1 year.

## 2024-01-23 DIAGNOSIS — J343 Hypertrophy of nasal turbinates: Secondary | ICD-10-CM | POA: Insufficient documentation

## 2024-01-23 DIAGNOSIS — J310 Chronic rhinitis: Secondary | ICD-10-CM | POA: Insufficient documentation

## 2024-01-23 DIAGNOSIS — J342 Deviated nasal septum: Secondary | ICD-10-CM | POA: Insufficient documentation

## 2024-01-23 DIAGNOSIS — H6123 Impacted cerumen, bilateral: Secondary | ICD-10-CM | POA: Insufficient documentation

## 2024-01-23 DIAGNOSIS — H903 Sensorineural hearing loss, bilateral: Secondary | ICD-10-CM | POA: Insufficient documentation

## 2024-01-23 DIAGNOSIS — R0982 Postnasal drip: Secondary | ICD-10-CM | POA: Insufficient documentation

## 2024-02-05 DIAGNOSIS — M810 Age-related osteoporosis without current pathological fracture: Secondary | ICD-10-CM | POA: Diagnosis not present

## 2024-02-05 DIAGNOSIS — N289 Disorder of kidney and ureter, unspecified: Secondary | ICD-10-CM | POA: Diagnosis not present

## 2024-02-05 DIAGNOSIS — K5730 Diverticulosis of large intestine without perforation or abscess without bleeding: Secondary | ICD-10-CM | POA: Diagnosis not present

## 2024-02-05 DIAGNOSIS — Z23 Encounter for immunization: Secondary | ICD-10-CM | POA: Diagnosis not present

## 2024-02-05 DIAGNOSIS — E785 Hyperlipidemia, unspecified: Secondary | ICD-10-CM | POA: Diagnosis not present

## 2024-02-05 DIAGNOSIS — J302 Other seasonal allergic rhinitis: Secondary | ICD-10-CM | POA: Diagnosis not present

## 2024-02-05 DIAGNOSIS — Z90710 Acquired absence of both cervix and uterus: Secondary | ICD-10-CM | POA: Diagnosis not present

## 2024-02-06 ENCOUNTER — Other Ambulatory Visit (HOSPITAL_BASED_OUTPATIENT_CLINIC_OR_DEPARTMENT_OTHER): Payer: Self-pay | Admitting: Internal Medicine

## 2024-02-06 DIAGNOSIS — M810 Age-related osteoporosis without current pathological fracture: Secondary | ICD-10-CM

## 2024-04-28 NOTE — Progress Notes (Unsigned)
 Office Visit Note  Patient: Stephanie Crawford             Date of Birth: 1945/07/09           MRN: [id number]             PCP: Elliot Charm, MD Referring: Elliot Charm,* Visit Date: 05/12/2024 Occupation: Data Unavailable  Subjective:  No chief complaint on file.   History of Present Illness: Stephanie Crawford is a 78 y.o. female ***     Activities of Daily Living:  Patient reports morning stiffness for *** {minute/hour:19697}.   Patient {ACTIONS;DENIES/REPORTS:21021675::Denies} nocturnal pain.  Difficulty dressing/grooming: {ACTIONS;DENIES/REPORTS:21021675::Denies} Difficulty climbing stairs: {ACTIONS;DENIES/REPORTS:21021675::Denies} Difficulty getting out of chair: {ACTIONS;DENIES/REPORTS:21021675::Denies} Difficulty using hands for taps, buttons, cutlery, and/or writing: {ACTIONS;DENIES/REPORTS:21021675::Denies}  No Rheumatology ROS completed.   PMFS History:  Patient Active Problem List   Diagnosis Date Noted   Chronic rhinitis 01/23/2024   Deviated nasal septum 01/23/2024   Hypertrophy of nasal turbinates 01/23/2024   Postnasal drip 01/23/2024   Impacted cerumen of both ears 01/23/2024   Sensorineural hearing loss, bilateral 01/23/2024   Dyspnea 12/19/2022   Hypertension 02/24/2019   Goals of care, counseling/discussion 11/14/2018   Adenocarcinoma of right lung, stage 2 (HCC) 10/25/2018   Encounter for antineoplastic chemotherapy 10/25/2018   S/P partial lobectomy of lung 10/09/2018   Primary osteoarthritis of both knees 05/17/2017   Primary osteoarthritis of both hands 05/17/2017   DDD (degenerative disc disease), lumbar 05/17/2017   History of gastroesophageal reflux (GERD) 05/17/2017   History of shingles 05/17/2017   History of cellulitis 05/17/2017   Multiple lung nodules on CT 11/30/2016   Cough 11/30/2016   Right knee pain 04/28/2015   Foot ulcer (HCC) 07/15/2014   Osteoporosis 01/30/2013   Vitamin D  deficiency  01/13/2013   Other and unspecified hyperlipidemia 01/06/2013   Breast thickening 01/06/2013   Skin lesion of right leg 09/19/2012   Chronic seasonal allergic rhinitis 09/19/2012   Elevated bilirubin 07/12/2012   History of cardiac murmur 07/12/2012   Rash and nonspecific skin eruption 07/11/2012   Rectal bleeding 05/11/2011   Abnormal EKG 03/01/2011   General medical examination 03/01/2011   GERD 12/08/2009   ANKLE EDEMA 12/08/2009    Past Medical History:  Diagnosis Date   Allergic rhinitis    Allergy     Anemia    prior to hysterectomy--1997   Arthritis    Dyspnea 12/19/2022   GERD (gastroesophageal reflux disease)    Heart murmur    Hemorrhoid 2011   History of shingles 04/2014   Kidney stones    Lung cancer (HCC)    Nodule of lower lobe of right lung    Osteoporosis    Varicose veins of left lower extremity     Family History  Problem Relation Age of Onset   Cancer Father    Prostate cancer Father    Asthma Sister    Heart attack Brother    Prostate cancer Brother    Lung cancer Maternal Grandmother    Asthma Daughter    Colon cancer Neg Hx    Esophageal cancer Neg Hx    Pancreatic cancer Neg Hx    Stomach cancer Neg Hx    Liver disease Neg Hx    Rectal cancer Neg Hx    Breast cancer Neg Hx    Past Surgical History:  Procedure Laterality Date   ABDOMINAL HYSTERECTOMY  1997   APPENDECTOMY  1960   BREAST EXCISIONAL BIOPSY Left    decades  ago- something was removed   HEMORRHOID SURGERY  01/2010   8/27 had a follow up procedure.     IR THORACENTESIS ASP PLEURAL SPACE W/IMG GUIDE  10/24/2018   LIPOMA EXCISION  1975   neck, left breast and righ jaw.   VIDEO ASSISTED THORACOSCOPY (VATS)/WEDGE RESECTION Right 10/09/2018   Procedure: VIDEO ASSISTED THORACOSCOPY (VATS)/WEDGE RESECTION;  Surgeon: Kerrin Elspeth BROCKS, MD;  Location: [facility] OR;  Service: Thoracic;  Laterality: Right;   WRIST FRACTURE SURGERY     Social History   Tobacco Use   Smoking status:  Never    Passive exposure: Never   Smokeless tobacco: Never  Vaping Use   Vaping status: Never Used  Substance Use Topics   Alcohol use: No    Alcohol/week: 0.0 standard drinks of alcohol   Drug use: No   Social History   Social History Narrative   [facility] (11/30/16):   Originally from [place], [place]. Grew up primarily in [place], [place]. She has also lived in [place] . She moved to [facility] in 2006. 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 Recently has acquired an outside dog. No bird exposure. Possible mold problem in her current home with history of water damage. No hot tub exposure. Enjoys babysitting her grandsons. Remote travel to [place], [place], [place], [place], [place], [place], [place], [place], [place], & [place].      Immunization History  Administered Date(s) Administered   PFIZER(Purple Top)SARS-COV-2 Vaccination 07/17/2019, 08/13/2019, 04/03/2020   PNEUMOCOCCAL CONJUGATE-20 10/31/2022   Pfizer Covid-19 Vaccine Bivalent Booster 68yrs & up 05/18/2021   Pfizer(Comirnaty )Fall Seasonal Vaccine 12 years and older 04/20/2022   Td 06/22/2009   Tdap 01/06/2013     Objective: Vital Signs: LMP 05/23/1995    Physical Exam   Musculoskeletal Exam: ***  CDAI Exam: CDAI Score: -- Patient Global: --; Provider Global: -- Swollen: --; Tender: -- Joint Exam 05/12/2024   No joint exam has been documented for this visit   There is currently no information documented on the homunculus. Go to the Rheumatology activity and complete the homunculus joint exam.  Investigation: No additional findings.  Imaging: No results found.  Recent Labs: Lab Results  Component Value Date   WBC 7.8 05/09/2023   HGB 13.7 05/09/2023   PLT 298 05/09/2023   NA 138 05/09/2023   K 3.6 05/09/2023   CL 103 05/09/2023   CO2 27 05/09/2023   GLUCOSE 92  05/09/2023   BUN 15 05/09/2023   CREATININE 1.01 (H) 05/09/2023   BILITOT 1.5 (H) 05/09/2023   ALKPHOS 63 05/09/2023   AST 53 (H) 05/09/2023   ALT 30 05/09/2023   PROT 7.0 05/09/2023   ALBUMIN 4.3 05/09/2023   CALCIUM  9.9 05/09/2023   GFRAA 55 (L) 10/21/2019    Speciality Comments: Fosamax  04/22-04/25  Procedures:  No procedures performed Allergies: Adhesive [tape]   Assessment / Plan:     Visit Diagnoses: No diagnosis found.  Orders: No orders of the defined types were placed in this encounter.  No orders of the defined types were placed in this encounter.   Face-to-face time spent with patient was *** minutes. Greater than 50% of time was spent in counseling and coordination of care.  Follow-Up Instructions: No follow-ups on file.   Maya Nash, MD  Note - This record has been created using Animal nutritionist.  Chart creation errors have been sought, but may not always  have been located. Such creation errors do not reflect on  the standard of medical care.

## 2024-05-05 ENCOUNTER — Ambulatory Visit (HOSPITAL_COMMUNITY): Payer: Medicare (Managed Care)

## 2024-05-05 ENCOUNTER — Other Ambulatory Visit: Payer: Medicare (Managed Care)

## 2024-05-06 ENCOUNTER — Inpatient Hospital Stay: Payer: Medicare (Managed Care) | Attending: Internal Medicine

## 2024-05-06 ENCOUNTER — Ambulatory Visit (HOSPITAL_COMMUNITY)
Admission: RE | Admit: 2024-05-06 | Discharge: 2024-05-06 | Payer: Medicare (Managed Care) | Attending: Internal Medicine | Admitting: Internal Medicine

## 2024-05-06 DIAGNOSIS — Z85118 Personal history of other malignant neoplasm of bronchus and lung: Secondary | ICD-10-CM | POA: Insufficient documentation

## 2024-05-06 DIAGNOSIS — Z902 Acquired absence of lung [part of]: Secondary | ICD-10-CM | POA: Insufficient documentation

## 2024-05-06 DIAGNOSIS — C3490 Malignant neoplasm of unspecified part of unspecified bronchus or lung: Secondary | ICD-10-CM | POA: Insufficient documentation

## 2024-05-06 DIAGNOSIS — Z08 Encounter for follow-up examination after completed treatment for malignant neoplasm: Secondary | ICD-10-CM | POA: Insufficient documentation

## 2024-05-06 DIAGNOSIS — Z9221 Personal history of antineoplastic chemotherapy: Secondary | ICD-10-CM | POA: Insufficient documentation

## 2024-05-06 LAB — CMP (CANCER CENTER ONLY)
ALT: 25 U/L (ref 0–44)
AST: 29 U/L (ref 15–41)
Albumin: 4.2 g/dL (ref 3.5–5.0)
Alkaline Phosphatase: 97 U/L (ref 38–126)
Anion gap: 11 (ref 5–15)
BUN: 14 mg/dL (ref 8–23)
CO2: 23 mmol/L (ref 22–32)
Calcium: 10 mg/dL (ref 8.9–10.3)
Chloride: 104 mmol/L (ref 98–111)
Creatinine: 1.01 mg/dL — ABNORMAL HIGH (ref 0.44–1.00)
GFR, Estimated: 57 mL/min — ABNORMAL LOW (ref >60)
Glucose, Bld: 120 mg/dL — ABNORMAL HIGH (ref 70–99)
Potassium: 3.8 mmol/L (ref 3.5–5.1)
Sodium: 138 mmol/L (ref 135–145)
Total Bilirubin: 1.4 mg/dL — ABNORMAL HIGH (ref 0.0–1.2)
Total Protein: 7.4 g/dL (ref 6.5–8.1)

## 2024-05-06 LAB — CBC WITH DIFFERENTIAL (CANCER CENTER ONLY)
Abs Immature Granulocytes: 0.01 K/uL (ref 0.00–0.07)
Basophils Absolute: 0.1 K/uL (ref 0.0–0.1)
Basophils Relative: 2 %
Eosinophils Absolute: 0.2 K/uL (ref 0.0–0.5)
Eosinophils Relative: 3 %
HCT: 39.9 % (ref 36.0–46.0)
Hemoglobin: 13.7 g/dL (ref 12.0–15.0)
Immature Granulocytes: 0 %
Lymphocytes Relative: 37 %
Lymphs Abs: 2.2 K/uL (ref 0.7–4.0)
MCH: 30.6 pg (ref 26.0–34.0)
MCHC: 34.3 g/dL (ref 30.0–36.0)
MCV: 89.1 fL (ref 80.0–100.0)
Monocytes Absolute: 0.7 K/uL (ref 0.1–1.0)
Monocytes Relative: 11 %
Neutro Abs: 2.9 K/uL (ref 1.7–7.7)
Neutrophils Relative %: 47 %
Platelet Count: 279 K/uL (ref 150–400)
RBC: 4.48 MIL/uL (ref 3.87–5.11)
RDW: 12.5 % (ref 11.5–15.5)
WBC Count: 6.1 K/uL (ref 4.0–10.5)
nRBC: 0 % (ref 0.0–0.2)

## 2024-05-06 MED ORDER — IOHEXOL 300 MG/ML  SOLN
75.0000 mL | Freq: Once | INTRAMUSCULAR | Status: AC | PRN
  Administered 2024-05-06: 14:00:00 75 mL via INTRAVENOUS

## 2024-05-08 ENCOUNTER — Ambulatory Visit: Payer: Medicare (Managed Care) | Admitting: Internal Medicine

## 2024-05-08 VITALS — BP 118/68 | HR 93 | Temp 97.6°F | Resp 17 | Ht 69.0 in | Wt 219.0 lb

## 2024-05-08 DIAGNOSIS — C3490 Malignant neoplasm of unspecified part of unspecified bronchus or lung: Secondary | ICD-10-CM

## 2024-05-08 DIAGNOSIS — Z08 Encounter for follow-up examination after completed treatment for malignant neoplasm: Secondary | ICD-10-CM | POA: Diagnosis not present
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
## 2024-05-12 ENCOUNTER — Ambulatory Visit (INDEPENDENT_AMBULATORY_CARE_PROVIDER_SITE_OTHER): Payer: Medicare (Managed Care)

## 2024-05-12 ENCOUNTER — Encounter: Payer: Self-pay | Admitting: Rheumatology

## 2024-05-12 ENCOUNTER — Ambulatory Visit: Payer: Medicare (Managed Care) | Attending: Rheumatology | Admitting: Rheumatology

## 2024-05-12 VITALS — BP 133/85 | HR 70 | Temp 97.4°F | Resp 16 | Ht 68.0 in | Wt 221.6 lb

## 2024-05-12 DIAGNOSIS — M810 Age-related osteoporosis without current pathological fracture: Secondary | ICD-10-CM | POA: Diagnosis not present

## 2024-05-12 DIAGNOSIS — M170 Bilateral primary osteoarthritis of knee: Secondary | ICD-10-CM

## 2024-05-12 DIAGNOSIS — G629 Polyneuropathy, unspecified: Secondary | ICD-10-CM | POA: Diagnosis not present

## 2024-05-12 DIAGNOSIS — M19042 Primary osteoarthritis, left hand: Secondary | ICD-10-CM

## 2024-05-12 DIAGNOSIS — M7061 Trochanteric bursitis, right hip: Secondary | ICD-10-CM | POA: Diagnosis not present

## 2024-05-12 DIAGNOSIS — Z902 Acquired absence of lung [part of]: Secondary | ICD-10-CM | POA: Diagnosis not present

## 2024-05-12 DIAGNOSIS — Z872 Personal history of diseases of the skin and subcutaneous tissue: Secondary | ICD-10-CM

## 2024-05-12 DIAGNOSIS — M65341 Trigger finger, right ring finger: Secondary | ICD-10-CM | POA: Diagnosis not present

## 2024-05-12 DIAGNOSIS — Z8619 Personal history of other infectious and parasitic diseases: Secondary | ICD-10-CM

## 2024-05-12 DIAGNOSIS — Z5181 Encounter for therapeutic drug level monitoring: Secondary | ICD-10-CM

## 2024-05-12 DIAGNOSIS — M7062 Trochanteric bursitis, left hip: Secondary | ICD-10-CM

## 2024-05-12 DIAGNOSIS — M65331 Trigger finger, right middle finger: Secondary | ICD-10-CM

## 2024-05-12 DIAGNOSIS — E785 Hyperlipidemia, unspecified: Secondary | ICD-10-CM

## 2024-05-12 DIAGNOSIS — M51369 Other intervertebral disc degeneration, lumbar region without mention of lumbar back pain or lower extremity pain: Secondary | ICD-10-CM

## 2024-05-12 DIAGNOSIS — M19041 Primary osteoarthritis, right hand: Secondary | ICD-10-CM

## 2024-05-12 DIAGNOSIS — Z8639 Personal history of other endocrine, nutritional and metabolic disease: Secondary | ICD-10-CM | POA: Diagnosis not present

## 2024-05-12 DIAGNOSIS — Z8679 Personal history of other diseases of the circulatory system: Secondary | ICD-10-CM | POA: Diagnosis not present

## 2024-05-12 DIAGNOSIS — C3491 Malignant neoplasm of unspecified part of right bronchus or lung: Secondary | ICD-10-CM | POA: Diagnosis not present

## 2024-05-12 DIAGNOSIS — Z8719 Personal history of other diseases of the digestive system: Secondary | ICD-10-CM

## 2024-05-12 MED ORDER — TRIAMCINOLONE ACETONIDE 40 MG/ML IJ SUSP
20.0000 mg | INTRAMUSCULAR | Status: AC | PRN
  Administered 2024-05-12: 20 mg

## 2024-05-12 MED ORDER — LIDOCAINE HCL 1 % IJ SOLN
0.5000 mL | INTRAMUSCULAR | Status: AC | PRN
  Administered 2024-05-12: .5 mL

## 2024-05-12 NOTE — Patient Instructions (Signed)
 Iliotibial Band Syndrome Rehab Ask your health care provider which exercises are safe for you. Do exercises exactly as told by your provider and adjust them as told. It's normal to feel mild stretching, pulling, tightness, or discomfort as you do these exercises. Stop right away if you feel sudden pain or your pain gets a lot worse. Do not begin these exercises until told by your provider. Stretching and range-of-motion exercises These exercises warm up your muscles and joints. They also improve the movement and flexibility of your hip and pelvis. Quadriceps stretch, prone  Lie face down (prone) on a firm surface like a bed or padded floor. Bend your left / right knee. Reach back to hold your ankle or pant leg. If you can't reach your ankle or pant leg, use a belt looped around your foot and grab the belt instead. Gently pull your heel toward your butt. Your knee should not slide out to the side. You should feel a stretch in the front of your thigh and knee, also called the quadriceps. Hold this position for __________ seconds. Repeat __________ times. Complete this exercise __________ times a day. Iliotibial band stretch The iliotibial band is a strip of tissue that runs along the outside of your hip down to your knee. Lie on your side with your left / right leg on top. Bend both knees and grab your left / right ankle. Stretch out your bottom arm to help you balance. Slowly bring your top knee back so your thigh goes behind your back. Slowly lower your top leg toward the floor until you feel a gentle stretch on the outside of your left / right hip and thigh. If you don't feel a stretch and your knee won't go farther, place the heel of your other foot on top of your knee and pull your knee down toward the floor with your foot. Hold this position for __________ seconds. Repeat __________ times. Complete this exercise __________ times a day. Strengthening exercises These exercises build strength  and endurance in your hip and pelvis. Endurance means your muscles can keep working even when they're tired. Straight leg raises, side-lying This exercise strengthens the muscles that rotate the leg at the hip and move it away from your body. These muscles are called hip abductors. Lie on your side with your left / right leg on top. Lie so your head, shoulder, hip, and knee line up. You can bend your bottom knee to help you balance. Roll your hips slightly forward so they're stacked directly over each other. Your left / right knee should face forward. Tense the muscles in your outer thigh and hip. Lift your top leg 4-6 inches (10-15 cm) off the ground. Hold this position for __________ seconds. Slowly lower your leg back down to the starting position. Let your muscles fully relax before doing this exercise again. Repeat __________ times. Complete this exercise __________ times a day. Leg raises, prone This exercise strengthens the muscles that move the hips backward. These muscles are called hip extensors. Lie face down (prone) on your bed or a firm surface. You can put a pillow under your hips for comfort and to support your lower back. Bend your left / right knee so your foot points straight up toward the ceiling. Keep the other leg straight and behind you. Squeeze your butt muscles. Lift your left / right thigh off the firm surface. Do not let your back arch. Tense your thigh muscle as hard as you can without having  more knee pain. Hold this position for __________ seconds. Slowly lower your leg to the starting position. Allow your leg to relax all the way. Repeat __________ times. Complete this exercise __________ times a day. Hip hike  Stand sideways on a bottom step. Place your feet so that your left / right leg is on the step, and the other foot is hanging off the side. If you need support for balance, hold onto a railing or wall. Keep your knees straight and your abdomen square,  meaning your hips are level. Then, lift your left / right hip up toward the ceiling. Slowly let your leg that's hanging off the step lower towards the floor. Your foot should get closer to the ground. Do not lean or bend your knees during this movement. Repeat __________ times. Complete this exercise __________ times a day. This information is not intended to replace advice given to you by your health care provider. Make sure you discuss any questions you have with your health care provider. Document Revised: 07/21/2022 Document Reviewed: 07/21/2022 [employer] Patient Education  2024 [employer] Hand Exercises Hand exercises can be helpful for almost anyone. They can strengthen your hands and improve flexibility and movement. The exercises can also increase blood flow to the hands. These results can make your work and daily tasks easier for you. Hand exercises can be especially helpful for people who have joint pain from arthritis or nerve damage from using their hands over and over. These exercises can also help people who injure a hand. Exercises Most of these hand exercises are gentle stretching and motion exercises. It is usually safe to do them often throughout the day. Warming up your hands before exercise may help reduce stiffness. You can do this with gentle massage or by placing your hands in warm water for 10-15 minutes. It is normal to feel some stretching, pulling, tightness, or mild discomfort when you begin new exercises. In time, this will improve. Remember to always be careful and stop right away if you feel sudden, very bad pain or your pain gets worse. You want to get better and be safe. Ask your health care provider which exercises are safe for you. Do exercises exactly as told by your provider and adjust them as told. Do not begin these exercises until told by your provider. Knuckle bend or claw fist  Stand or sit with your arm, hand, and all five fingers pointed straight up.  Make sure to keep your wrist straight. Gently bend your fingers down toward your palm until the tips of your fingers are touching your palm. Keep your big knuckle straight and only bend the small knuckles in your fingers. Hold this position for 10 seconds. Straighten your fingers back to your starting position. Repeat this exercise 5-10 times with each hand. Full finger fist  Stand or sit with your arm, hand, and all five fingers pointed straight up. Make sure to keep your wrist straight. Gently bend your fingers into your palm until the tips of your fingers are touching the middle of your palm. Hold this position for 10 seconds. Extend your fingers back to your starting position, stretching every joint fully. Repeat this exercise 5-10 times with each hand. Straight fist  Stand or sit with your arm, hand, and all five fingers pointed straight up. Make sure to keep your wrist straight. Gently bend your fingers at the big knuckle, where your fingers meet your hand, and at the middle knuckle. Keep the knuckle at the  tips of your fingers straight and try to touch the bottom of your palm. Hold this position for 10 seconds. Extend your fingers back to your starting position, stretching every joint fully. Repeat this exercise 5-10 times with each hand. Tabletop  Stand or sit with your arm, hand, and all five fingers pointed straight up. Make sure to keep your wrist straight. Gently bend your fingers at the big knuckle, where your fingers meet your hand, as far down as you can. Keep the small knuckles in your fingers straight. Think of forming a tabletop with your fingers. Hold this position for 10 seconds. Extend your fingers back to your starting position, stretching every joint fully. Repeat this exercise 5-10 times with each hand. Finger spread  Place your hand flat on a table with your palm facing down. Make sure your wrist stays straight. Spread your fingers and thumb apart from each other  as far as you can until you feel a gentle stretch. Hold this position for 10 seconds. Bring your fingers and thumb tight together again. Hold this position for 10 seconds. Repeat this exercise 5-10 times with each hand. Making circles  Stand or sit with your arm, hand, and all five fingers pointed straight up. Make sure to keep your wrist straight. Make a circle by touching the tip of your thumb to the tip of your index finger. Hold for 10 seconds. Then open your hand wide. Repeat this motion with your thumb and each of your fingers. Repeat this exercise 5-10 times with each hand. Thumb motion  Sit with your forearm resting on a table and your wrist straight. Your thumb should be facing up toward the ceiling. Keep your fingers relaxed as you move your thumb. Lift your thumb up as high as you can toward the ceiling. Hold for 10 seconds. Bend your thumb across your palm as far as you can, reaching the tip of your thumb for the small finger (pinkie) side of your palm. Hold for 10 seconds. Repeat this exercise 5-10 times with each hand. Grip strengthening  Hold a stress ball or other soft ball in the middle of your hand. Slowly increase the pressure, squeezing the ball as much as you can without causing pain. Think of bringing the tips of your fingers into the middle of your palm. All of your finger joints should bend when doing this exercise. Hold your squeeze for 10 seconds, then relax. Repeat this exercise 5-10 times with each hand. Contact a health care provider if: Your hand pain or discomfort gets much worse when you do an exercise. Your hand pain or discomfort does not improve within 2 hours after you exercise. If you have either of these problems, stop doing these exercises right away. Do not do them again unless your provider says that you can. Get help right away if: You develop sudden, severe hand pain or swelling. If this happens, stop doing these exercises right away. Do not do  them again unless your provider says that you can. This information is not intended to replace advice given to you by your health care provider. Make sure you discuss any questions you have with your health care provider. Document Revised: 05/23/2022 Document Reviewed: 05/23/2022 [employer] Patient Education  2024 [employer].

## 2024-06-03 ENCOUNTER — Other Ambulatory Visit (HOSPITAL_BASED_OUTPATIENT_CLINIC_OR_DEPARTMENT_OTHER): Payer: Medicare (Managed Care)

## 2024-06-04 ENCOUNTER — Other Ambulatory Visit (HOSPITAL_BASED_OUTPATIENT_CLINIC_OR_DEPARTMENT_OTHER): Payer: Medicare (Managed Care)

## 2024-06-26 ENCOUNTER — Ambulatory Visit (HOSPITAL_BASED_OUTPATIENT_CLINIC_OR_DEPARTMENT_OTHER)
Admission: RE | Admit: 2024-06-26 | Discharge: 2024-06-26 | Disposition: A | Payer: Medicare (Managed Care) | Source: Ambulatory Visit | Attending: Internal Medicine | Admitting: Internal Medicine

## 2024-06-26 DIAGNOSIS — M810 Age-related osteoporosis without current pathological fracture: Secondary | ICD-10-CM

## 2024-11-10 ENCOUNTER — Ambulatory Visit: Payer: Medicare (Managed Care) | Admitting: Rheumatology

## 2025-04-29 ENCOUNTER — Inpatient Hospital Stay: Payer: Medicare (Managed Care)

## 2025-05-04 ENCOUNTER — Inpatient Hospital Stay: Payer: Medicare (Managed Care) | Admitting: Internal Medicine
